# Patient Record
Sex: Female | Born: 1940 | Race: Black or African American | Hispanic: No | Smoking: Never smoker
Health system: Southern US, Community
[De-identification: ages and names within clinical notes are randomized; demographics above are authoritative.]

## PROBLEM LIST (undated history)

## (undated) DIAGNOSIS — K8020 Calculus of gallbladder without cholecystitis without obstruction: Secondary | ICD-10-CM

## (undated) DIAGNOSIS — D219 Benign neoplasm of connective and other soft tissue, unspecified: Secondary | ICD-10-CM

## (undated) DIAGNOSIS — E785 Hyperlipidemia, unspecified: Secondary | ICD-10-CM

## (undated) DIAGNOSIS — R011 Cardiac murmur, unspecified: Secondary | ICD-10-CM

## (undated) DIAGNOSIS — I10 Essential (primary) hypertension: Secondary | ICD-10-CM

## (undated) HISTORY — DX: Essential (primary) hypertension: I10

## (undated) HISTORY — PX: NO PAST SURGERIES: SHX2092

## (undated) HISTORY — DX: Hyperlipidemia, unspecified: E78.5

## (undated) HISTORY — DX: Calculus of gallbladder without cholecystitis without obstruction: K80.20

## (undated) HISTORY — DX: Benign neoplasm of connective and other soft tissue, unspecified: D21.9

## (undated) HISTORY — DX: Cardiac murmur, unspecified: R01.1

---

## 1997-10-24 ENCOUNTER — Other Ambulatory Visit: Admission: RE | Admit: 1997-10-24 | Discharge: 1997-10-24 | Payer: Self-pay | Admitting: Gynecology

## 1997-11-28 ENCOUNTER — Ambulatory Visit (HOSPITAL_COMMUNITY): Admission: RE | Admit: 1997-11-28 | Discharge: 1997-11-28 | Payer: Self-pay | Admitting: Gastroenterology

## 1998-11-12 ENCOUNTER — Other Ambulatory Visit: Admission: RE | Admit: 1998-11-12 | Discharge: 1998-11-12 | Payer: Self-pay | Admitting: Gynecology

## 1998-11-12 ENCOUNTER — Encounter (INDEPENDENT_AMBULATORY_CARE_PROVIDER_SITE_OTHER): Payer: Self-pay | Admitting: Specialist

## 1998-12-04 ENCOUNTER — Encounter: Admission: RE | Admit: 1998-12-04 | Discharge: 1999-03-04 | Payer: Self-pay | Admitting: Gynecology

## 1999-11-24 ENCOUNTER — Other Ambulatory Visit: Admission: RE | Admit: 1999-11-24 | Discharge: 1999-11-24 | Payer: Self-pay | Admitting: Gynecology

## 2000-11-23 ENCOUNTER — Other Ambulatory Visit: Admission: RE | Admit: 2000-11-23 | Discharge: 2000-11-23 | Payer: Self-pay | Admitting: Gynecology

## 2000-12-08 ENCOUNTER — Encounter: Admission: RE | Admit: 2000-12-08 | Discharge: 2001-03-08 | Payer: Self-pay | Admitting: Gynecology

## 2001-10-17 LAB — HM DEXA SCAN: HM Dexa Scan: NORMAL

## 2001-12-29 ENCOUNTER — Other Ambulatory Visit: Admission: RE | Admit: 2001-12-29 | Discharge: 2001-12-29 | Payer: Self-pay | Admitting: Gynecology

## 2003-06-06 ENCOUNTER — Other Ambulatory Visit: Admission: RE | Admit: 2003-06-06 | Discharge: 2003-06-06 | Payer: Self-pay | Admitting: Family Medicine

## 2004-06-04 ENCOUNTER — Encounter (INDEPENDENT_AMBULATORY_CARE_PROVIDER_SITE_OTHER): Payer: Self-pay | Admitting: Specialist

## 2004-06-04 ENCOUNTER — Ambulatory Visit (HOSPITAL_COMMUNITY): Admission: RE | Admit: 2004-06-04 | Discharge: 2004-06-04 | Payer: Self-pay | Admitting: Gastroenterology

## 2004-06-04 LAB — HM COLONOSCOPY

## 2004-07-07 ENCOUNTER — Ambulatory Visit: Payer: Self-pay | Admitting: Family Medicine

## 2004-09-23 ENCOUNTER — Ambulatory Visit: Payer: Self-pay | Admitting: Family Medicine

## 2004-10-01 ENCOUNTER — Ambulatory Visit: Payer: Self-pay | Admitting: Family Medicine

## 2004-12-02 ENCOUNTER — Other Ambulatory Visit: Admission: RE | Admit: 2004-12-02 | Discharge: 2004-12-02 | Payer: Self-pay | Admitting: Family Medicine

## 2004-12-02 ENCOUNTER — Encounter: Payer: Self-pay | Admitting: Family Medicine

## 2004-12-02 ENCOUNTER — Ambulatory Visit: Payer: Self-pay | Admitting: Family Medicine

## 2004-12-02 LAB — CONVERTED CEMR LAB: Pap Smear: NORMAL

## 2005-06-02 ENCOUNTER — Ambulatory Visit: Payer: Self-pay | Admitting: Family Medicine

## 2005-12-29 ENCOUNTER — Ambulatory Visit: Payer: Self-pay | Admitting: Family Medicine

## 2006-03-18 ENCOUNTER — Ambulatory Visit: Payer: Self-pay | Admitting: Family Medicine

## 2006-03-18 LAB — CONVERTED CEMR LAB
ALT: 17 units/L (ref 0–40)
AST: 24 units/L (ref 0–37)
Albumin: 3.9 g/dL (ref 3.5–5.2)
Alkaline Phosphatase: 84 units/L (ref 39–117)
BUN: 10 mg/dL (ref 6–23)
Basophils Absolute: 0 10*3/uL (ref 0.0–0.1)
Basophils Relative: 0.4 % (ref 0.0–1.0)
Bilirubin, Direct: 0.1 mg/dL (ref 0.0–0.3)
CO2: 31 meq/L (ref 19–32)
Calcium: 10.1 mg/dL (ref 8.4–10.5)
Chloride: 102 meq/L (ref 96–112)
Cholesterol: 226 mg/dL (ref 0–200)
Creatinine, Ser: 1 mg/dL (ref 0.4–1.2)
Direct LDL: 129.8 mg/dL
Eosinophils Absolute: 0.2 10*3/uL (ref 0.0–0.6)
Eosinophils Relative: 2.2 % (ref 0.0–5.0)
GFR calc Af Amer: 72 mL/min
GFR calc non Af Amer: 60 mL/min
Glucose, Bld: 106 mg/dL — ABNORMAL HIGH (ref 70–99)
HCT: 39.4 % (ref 36.0–46.0)
HDL: 71 mg/dL (ref >39.0)
Hemoglobin: 13.4 g/dL (ref 12.0–15.0)
Lymphocytes Relative: 28.8 % (ref 12.0–46.0)
MCHC: 33.9 g/dL (ref 30.0–36.0)
MCV: 83 fL (ref 78.0–100.0)
Monocytes Absolute: 0.7 10*3/uL (ref 0.2–0.7)
Monocytes Relative: 6.8 % (ref 3.0–11.0)
Neutro Abs: 6.3 10*3/uL (ref 1.4–7.7)
Neutrophils Relative %: 61.8 % (ref 43.0–77.0)
Platelets: 345 10*3/uL (ref 150–400)
Potassium: 3.6 meq/L (ref 3.5–5.1)
RBC: 4.75 M/uL (ref 3.87–5.11)
RDW: 13.4 % (ref 11.5–14.6)
Sodium: 139 meq/L (ref 135–145)
TSH: 2.1 microintl units/mL (ref 0.35–5.50)
Total Bilirubin: 0.6 mg/dL (ref 0.3–1.2)
Total CHOL/HDL Ratio: 3.2
Total Protein: 7.8 g/dL (ref 6.0–8.3)
Triglycerides: 95 mg/dL (ref 0–149)
VLDL: 19 mg/dL (ref 0–40)
WBC: 10.1 10*3/uL (ref 4.5–10.5)

## 2006-12-06 ENCOUNTER — Encounter: Payer: Self-pay | Admitting: Family Medicine

## 2006-12-06 ENCOUNTER — Ambulatory Visit: Payer: Self-pay | Admitting: Internal Medicine

## 2007-03-23 ENCOUNTER — Encounter: Payer: Self-pay | Admitting: Family Medicine

## 2007-03-23 DIAGNOSIS — D259 Leiomyoma of uterus, unspecified: Secondary | ICD-10-CM | POA: Insufficient documentation

## 2007-03-23 DIAGNOSIS — R011 Cardiac murmur, unspecified: Secondary | ICD-10-CM

## 2007-03-23 DIAGNOSIS — N951 Menopausal and female climacteric states: Secondary | ICD-10-CM | POA: Insufficient documentation

## 2007-03-23 DIAGNOSIS — I10 Essential (primary) hypertension: Secondary | ICD-10-CM

## 2007-03-23 DIAGNOSIS — E1169 Type 2 diabetes mellitus with other specified complication: Secondary | ICD-10-CM | POA: Insufficient documentation

## 2007-03-23 DIAGNOSIS — E7800 Pure hypercholesterolemia, unspecified: Secondary | ICD-10-CM

## 2007-03-24 ENCOUNTER — Ambulatory Visit: Payer: Self-pay | Admitting: Family Medicine

## 2007-03-25 ENCOUNTER — Encounter: Payer: Self-pay | Admitting: Family Medicine

## 2007-03-29 ENCOUNTER — Ambulatory Visit: Payer: Self-pay | Admitting: Family Medicine

## 2007-03-30 LAB — CONVERTED CEMR LAB
Albumin: 4 g/dL (ref 3.5–5.2)
Basophils Absolute: 0.1 10*3/uL (ref 0.0–0.1)
Calcium: 10 mg/dL (ref 8.4–10.5)
Chloride: 104 meq/L (ref 96–112)
Cholesterol: 238 mg/dL (ref 0–200)
Eosinophils Absolute: 0.2 10*3/uL (ref 0.0–0.6)
GFR calc Af Amer: 81 mL/min
GFR calc non Af Amer: 67 mL/min
HDL: 76 mg/dL (ref >39.0)
MCHC: 32.9 g/dL (ref 30.0–36.0)
MCV: 84.5 fL (ref 78.0–100.0)
Monocytes Relative: 5.8 % (ref 3.0–11.0)
Platelets: 267 10*3/uL (ref 150–400)
Potassium: 3.7 meq/L (ref 3.5–5.1)
RBC: 5.04 M/uL (ref 3.87–5.11)
RDW: 14.1 % (ref 11.5–14.6)
TSH: 3.1 microintl units/mL (ref 0.35–5.50)
Total CHOL/HDL Ratio: 3.1
Triglycerides: 132 mg/dL (ref 0–149)

## 2007-04-04 ENCOUNTER — Encounter (INDEPENDENT_AMBULATORY_CARE_PROVIDER_SITE_OTHER): Payer: Self-pay | Admitting: *Deleted

## 2007-04-07 ENCOUNTER — Ambulatory Visit: Payer: Self-pay | Admitting: Family Medicine

## 2007-06-29 ENCOUNTER — Ambulatory Visit: Payer: Self-pay | Admitting: Family Medicine

## 2007-07-01 LAB — CONVERTED CEMR LAB: Hgb A1c MFr Bld: 6.6 % — ABNORMAL HIGH (ref 4.6–6.0)

## 2007-07-06 ENCOUNTER — Ambulatory Visit: Payer: Self-pay | Admitting: Family Medicine

## 2007-07-06 DIAGNOSIS — E119 Type 2 diabetes mellitus without complications: Secondary | ICD-10-CM | POA: Insufficient documentation

## 2007-07-08 ENCOUNTER — Encounter: Payer: Self-pay | Admitting: Family Medicine

## 2007-07-25 ENCOUNTER — Encounter: Payer: Self-pay | Admitting: Family Medicine

## 2007-07-25 ENCOUNTER — Encounter: Admission: RE | Admit: 2007-07-25 | Discharge: 2007-08-09 | Payer: Self-pay | Admitting: Family Medicine

## 2007-08-24 ENCOUNTER — Ambulatory Visit: Payer: Self-pay | Admitting: Family Medicine

## 2007-10-31 ENCOUNTER — Ambulatory Visit: Payer: Self-pay | Admitting: Family Medicine

## 2007-11-14 ENCOUNTER — Ambulatory Visit: Payer: Self-pay | Admitting: Family Medicine

## 2008-01-31 ENCOUNTER — Ambulatory Visit: Payer: Self-pay | Admitting: Family Medicine

## 2008-02-01 LAB — CONVERTED CEMR LAB
ALT: 19 units/L (ref 0–35)
AST: 23 units/L (ref 0–37)
CO2: 30 meq/L (ref 19–32)
Chloride: 107 meq/L (ref 96–112)
Cholesterol: 206 mg/dL (ref 0–200)
GFR calc Af Amer: 72 mL/min
GFR calc non Af Amer: 59 mL/min
Hgb A1c MFr Bld: 6.3 % — ABNORMAL HIGH (ref 4.6–6.0)
Phosphorus: 3.8 mg/dL (ref 2.3–4.6)
Potassium: 3.6 meq/L (ref 3.5–5.1)
Sodium: 142 meq/L (ref 135–145)
Total CHOL/HDL Ratio: 2.3
VLDL: 11 mg/dL (ref 0–40)

## 2008-02-06 ENCOUNTER — Ambulatory Visit: Payer: Self-pay | Admitting: Family Medicine

## 2008-03-12 ENCOUNTER — Encounter: Payer: Self-pay | Admitting: Family Medicine

## 2008-03-26 ENCOUNTER — Encounter: Payer: Self-pay | Admitting: Family Medicine

## 2008-03-28 ENCOUNTER — Encounter (INDEPENDENT_AMBULATORY_CARE_PROVIDER_SITE_OTHER): Payer: Self-pay | Admitting: *Deleted

## 2008-08-07 ENCOUNTER — Ambulatory Visit: Payer: Self-pay | Admitting: Family Medicine

## 2008-08-08 LAB — CONVERTED CEMR LAB
ALT: 16 units/L (ref 0–35)
Alkaline Phosphatase: 59 units/L (ref 39–117)
Bilirubin, Direct: 0 mg/dL (ref 0.0–0.3)
CO2: 30 meq/L (ref 19–32)
Calcium: 9.6 mg/dL (ref 8.4–10.5)
Chloride: 106 meq/L (ref 96–112)
Creatinine, Ser: 0.9 mg/dL (ref 0.4–1.2)
Creatinine,U: 38 mg/dL
Direct LDL: 117.5 mg/dL
Potassium: 3.8 meq/L (ref 3.5–5.1)
Sodium: 144 meq/L (ref 135–145)
Total Bilirubin: 0.9 mg/dL (ref 0.3–1.2)
Total CHOL/HDL Ratio: 3
Total Protein: 7.3 g/dL (ref 6.0–8.3)
Triglycerides: 138 mg/dL (ref 0.0–149.0)

## 2008-08-14 ENCOUNTER — Other Ambulatory Visit: Admission: RE | Admit: 2008-08-14 | Discharge: 2008-08-14 | Payer: Self-pay | Admitting: Family Medicine

## 2008-08-14 ENCOUNTER — Ambulatory Visit: Payer: Self-pay | Admitting: Family Medicine

## 2008-08-14 ENCOUNTER — Encounter: Payer: Self-pay | Admitting: Family Medicine

## 2008-08-14 LAB — HM PAP SMEAR

## 2008-08-17 ENCOUNTER — Encounter (INDEPENDENT_AMBULATORY_CARE_PROVIDER_SITE_OTHER): Payer: Self-pay | Admitting: *Deleted

## 2008-09-06 ENCOUNTER — Emergency Department (HOSPITAL_COMMUNITY): Admission: EM | Admit: 2008-09-06 | Discharge: 2008-09-06 | Payer: Self-pay | Admitting: Emergency Medicine

## 2009-01-21 ENCOUNTER — Telehealth: Payer: Self-pay | Admitting: Family Medicine

## 2009-01-29 ENCOUNTER — Ambulatory Visit: Payer: Self-pay | Admitting: Internal Medicine

## 2009-01-29 ENCOUNTER — Encounter: Payer: Self-pay | Admitting: Family Medicine

## 2009-02-11 ENCOUNTER — Ambulatory Visit: Payer: Self-pay | Admitting: Family Medicine

## 2009-02-12 ENCOUNTER — Telehealth: Payer: Self-pay | Admitting: Family Medicine

## 2009-02-12 LAB — CONVERTED CEMR LAB
Cholesterol: 235 mg/dL — ABNORMAL HIGH (ref 0–200)
Direct LDL: 122.9 mg/dL
Hgb A1c MFr Bld: 6.2 % (ref 4.6–6.5)
Triglycerides: 97 mg/dL (ref 0.0–149.0)
VLDL: 19.4 mg/dL (ref 0.0–40.0)

## 2009-02-20 ENCOUNTER — Encounter (INDEPENDENT_AMBULATORY_CARE_PROVIDER_SITE_OTHER): Payer: Self-pay | Admitting: *Deleted

## 2009-03-21 ENCOUNTER — Encounter: Payer: Self-pay | Admitting: Family Medicine

## 2009-03-27 ENCOUNTER — Encounter: Payer: Self-pay | Admitting: Family Medicine

## 2009-04-08 ENCOUNTER — Encounter: Payer: Self-pay | Admitting: Family Medicine

## 2009-04-08 ENCOUNTER — Encounter (INDEPENDENT_AMBULATORY_CARE_PROVIDER_SITE_OTHER): Payer: Self-pay | Admitting: *Deleted

## 2009-05-07 ENCOUNTER — Encounter: Payer: Self-pay | Admitting: Family Medicine

## 2009-05-14 ENCOUNTER — Ambulatory Visit: Payer: Self-pay | Admitting: Family Medicine

## 2009-05-14 LAB — CONVERTED CEMR LAB
ALT: 17 units/L (ref 0–35)
HDL: 89.3 mg/dL (ref >39.00)
Triglycerides: 96 mg/dL (ref 0.0–149.0)
VLDL: 19.2 mg/dL (ref 0.0–40.0)

## 2009-05-21 ENCOUNTER — Ambulatory Visit: Payer: Self-pay | Admitting: Family Medicine

## 2009-06-26 ENCOUNTER — Encounter: Payer: Self-pay | Admitting: Family Medicine

## 2009-07-02 ENCOUNTER — Ambulatory Visit: Payer: Self-pay | Admitting: Family Medicine

## 2009-07-30 ENCOUNTER — Encounter: Admission: RE | Admit: 2009-07-30 | Discharge: 2009-07-30 | Payer: Self-pay | Admitting: Gastroenterology

## 2009-07-30 ENCOUNTER — Encounter: Payer: Self-pay | Admitting: Family Medicine

## 2009-08-20 ENCOUNTER — Ambulatory Visit: Payer: Self-pay | Admitting: Family Medicine

## 2009-08-20 LAB — CONVERTED CEMR LAB
Albumin: 4 g/dL (ref 3.5–5.2)
BUN: 14 mg/dL (ref 6–23)
CO2: 26 meq/L (ref 19–32)
Calcium: 9.6 mg/dL (ref 8.4–10.5)
Cholesterol: 186 mg/dL (ref 0–200)
Creatinine, Ser: 0.9 mg/dL (ref 0.4–1.2)
LDL Cholesterol: 98 mg/dL (ref 0–99)
Total CHOL/HDL Ratio: 3
Triglycerides: 82 mg/dL (ref 0.0–149.0)
VLDL: 16.4 mg/dL (ref 0.0–40.0)

## 2009-08-28 ENCOUNTER — Ambulatory Visit: Payer: Self-pay | Admitting: Family Medicine

## 2009-09-03 ENCOUNTER — Telehealth: Payer: Self-pay | Admitting: Family Medicine

## 2010-02-19 ENCOUNTER — Other Ambulatory Visit: Payer: Self-pay | Admitting: Family Medicine

## 2010-02-19 ENCOUNTER — Ambulatory Visit
Admission: RE | Admit: 2010-02-19 | Discharge: 2010-02-19 | Payer: Self-pay | Source: Home / Self Care | Attending: Family Medicine | Admitting: Family Medicine

## 2010-02-19 LAB — LIPID PANEL
Cholesterol: 245 mg/dL — ABNORMAL HIGH (ref 0–200)
HDL: 93.4 mg/dL (ref >39.00)
Total CHOL/HDL Ratio: 3
Triglycerides: 57 mg/dL (ref 0.0–149.0)
VLDL: 11.4 mg/dL (ref 0.0–40.0)

## 2010-02-19 LAB — RENAL FUNCTION PANEL
Albumin: 3.8 g/dL (ref 3.5–5.2)
BUN: 17 mg/dL (ref 6–23)
CO2: 31 mEq/L (ref 19–32)
Calcium: 9.8 mg/dL (ref 8.4–10.5)
Chloride: 105 mEq/L (ref 96–112)
Creatinine, Ser: 0.9 mg/dL (ref 0.4–1.2)
GFR: 79.1 mL/min (ref >60.00)
Glucose, Bld: 98 mg/dL (ref 70–99)
Phosphorus: 3.9 mg/dL (ref 2.3–4.6)
Potassium: 3.9 mEq/L (ref 3.5–5.1)
Sodium: 142 mEq/L (ref 135–145)

## 2010-02-19 LAB — LDL CHOLESTEROL, DIRECT: Direct LDL: 131 mg/dL

## 2010-02-19 LAB — ALT: ALT: 38 U/L — ABNORMAL HIGH (ref 0–35)

## 2010-02-19 LAB — AST: AST: 36 U/L (ref 0–37)

## 2010-02-19 LAB — HEMOGLOBIN A1C: Hgb A1c MFr Bld: 6.4 % (ref 4.6–6.5)

## 2010-02-24 ENCOUNTER — Ambulatory Visit
Admission: RE | Admit: 2010-02-24 | Discharge: 2010-02-24 | Payer: Self-pay | Source: Home / Self Care | Attending: Family Medicine | Admitting: Family Medicine

## 2010-02-24 LAB — HM DIABETES FOOT EXAM

## 2010-03-16 LAB — CONVERTED CEMR LAB
AST: 22 units/L (ref 0–37)
BUN: 12 mg/dL (ref 6–23)
Calcium: 9.8 mg/dL (ref 8.4–10.5)
Chloride: 111 meq/L (ref 96–112)
GFR calc non Af Amer: 59 mL/min
Glucose, Bld: 93 mg/dL (ref 70–99)
HDL: 62.8 mg/dL (ref >39.0)
Hgb A1c MFr Bld: 6.2 % — ABNORMAL HIGH (ref 4.6–6.0)
Phosphorus: 3.8 mg/dL (ref 2.3–4.6)
Potassium: 4 meq/L (ref 3.5–5.1)
Sodium: 145 meq/L (ref 135–145)
VLDL: 10 mg/dL (ref 0–40)

## 2010-03-18 ENCOUNTER — Ambulatory Visit
Admission: RE | Admit: 2010-03-18 | Discharge: 2010-03-18 | Payer: Self-pay | Source: Home / Self Care | Attending: Family Medicine | Admitting: Family Medicine

## 2010-03-20 NOTE — Consult Note (Signed)
Summary: [facility]  [facility]   Imported By: Lanelle Bal 05/16/2009 12:00:45  _____________________________________________________________________  External Attachment:    Type:   Image     Comment:   External Document

## 2010-03-20 NOTE — Letter (Signed)
Summary: Results Follow up Letter  [place] at [facility]  [address] [place], [place] [address]   Phone: [phone]  Fax: [phone]    04/08/2009 MRN: [id number]    [facility] [address] [place], [place]  [address]    Dear Ms. Mcclain,  The following are the results of your recent test(s):  Test         Result    Pap Smear:        Normal _____  Not Normal _____ Comments: ______________________________________________________ Cholesterol: LDL(Bad cholesterol):         Your goal is less than:         HDL (Good cholesterol):       Your goal is more than: Comments:  ______________________________________________________ Mammogram:        Normal __X___  Not Normal _____ Comments:  Yearly follow up is recommended.   ___________________________________________________________________ Hemoccult:        Normal _____  Not normal _______ Comments:    _____________________________________________________________________ Other Tests:    We routinely do not discuss normal results over the telephone.  If you desire a copy of the results, or you have any questions about this information we can discuss them at your next office visit.   Sincerely,

## 2010-03-20 NOTE — Assessment & Plan Note (Signed)
Summary: 6WK FOLLOW UP / LFW   Vital Signs:  Patient profile:   70 year old female Height:      60.75 inches Weight:      136.25 pounds BMI:     26.05 Temp:     97.9 degrees F oral Pulse rate:   72 / minute Pulse rhythm:   regular BP sitting:   148 / 82  (left arm) Cuff size:   regular  Vitals Entered By: Lewanda Rife LPN (Jul 02, 2009 9:01 AM)  Serial Vital Signs/Assessments:  Time      Position  BP       Pulse  Resp  Temp     By                     140/80                         Colon Flattery Caldonia Leap MD                     140/80                         Judith Part MD  CC: six week f/u   History of Present Illness: here for f/u of HTN   is feeling ok   bp first check today 148/82-- this is slt imp from last time last visit did inc lisinopril to 40  has checked bp at work -- is still high --- 160s - may not be reliable   sugar control good and stable  exercise -- is really trying  is working out in yard every day  not much on rainy days   Allergies: 1)  ! Septra 2)  ! Advil 3)  ! 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 Eyes:  Denies blurring and eye irritation. CV:  Denies chest pain or discomfort, lightheadness, palpitations, shortness of breath with exertion, and swelling of feet. Resp:  Denies cough, shortness of breath, and wheezing. GI:   Denies abdominal pain and change in bowel habits. GU:  Denies urinary frequency. MS:  Denies joint pain. Derm:  Denies lesion(s), poor wound healing, and rash. Neuro:  Denies headaches, numbness, and tingling. Endo:  Denies cold intolerance, excessive thirst, excessive urination, and heat intolerance. Heme:  Denies abnormal bruising and bleeding.  Physical Exam  General:  Well-developed,well-nourished,in no acute distress; alert,appropriate and cooperative throughout examination Head:  normocephalic, atraumatic, and no abnormalities observed.   Eyes:  vision grossly intact, pupils equal, pupils round, and pupils reactive to light.   Mouth:  pharynx pink and moist.   Neck:  supple with full rom and no masses or thyromegally, no JVD or carotid bruit  Lungs:  Normal respiratory effort, chest expands symmetrically. Lungs are clear to auscultation, no crackles or wheezes. Heart:  systolic M- quiet  RRR Msk:  No deformity or scoliosis noted  of thoracic or lumbar spine.  no acute joint changes  Pulses:  R and L carotid,radial,femoral,dorsalis pedis and posterior tibial pulses are full and equal bilaterally Extremities:  No clubbing, cyanosis, edema, or deformity noted with normal full range of motion of all joints.   Neurologic:  sensation intact to light touch, gait normal, and DTRs symmetrical and normal.   Skin:  Intact without suspicious lesions or rashes Cervical Nodes:  No lymphadenopathy noted Psych:  normal affect, talkative and pleasant   Diabetes Management Exam:    Foot Exam (with socks and/or shoes not present):       Sensory-Pinprick/Light touch:          Left medial foot (L-4): normal          Left dorsal foot (L-5): normal          Left lateral foot (S-1): normal          Right medial foot (L-4): normal          Right dorsal foot (L-5): normal          Right lateral foot (S-1): normal       Sensory-Monofilament:          Left foot: normal          Right foot: normal        Inspection:          Left foot: normal          Right foot: normal       Nails:          Left foot: normal          Right foot: normal   Impression & Recommendations:  Problem # 1:  HYPERTENSION (ICD-401.9) Assessment Improved  bp isimproved on second check today will continue exercise and better diet  will continue 40 of lisinopril  if it goes up - will plan to add diuretic at f/u  lab and f/u planned for july Her updated medication list for this problem includes:    Lisinopril 40 Mg Tabs (Lisinopril) .Marland Kitchen... 1 by mouth once daily  BP today: 148/82-- re check 140/80 Prior BP: 150/84 (05/21/2009)  Labs Reviewed: K+: 3.8 (08/07/2008) Creat: : 0.9 (08/07/2008)   Chol: 220 (05/14/2009)   HDL: 89.30 (05/14/2009)   LDL: DEL (01/31/2008)   TG: 96.0 (05/14/2009)  Problem # 2:  HYPERCHOLESTEROLEMIA (ICD-272.0) Assessment: Unchanged  fairly controlled - rev diet lab and f/u july   Labs Reviewed: SGOT: 22 (05/14/2009)   SGPT: 17 (05/14/2009)   HDL:89.30 (05/14/2009), 88.60 (02/11/2009)  LDL:DEL (01/31/2008), DEL (10/31/2007)  Chol:220 (05/14/2009), 235 (02/11/2009)  Trig:96.0 (05/14/2009), 97.0 (02/11/2009)  Problem # 3:  HYPERGLYCEMIA (ICD-790.29) Assessment: Comment Only hyperglycemia - controlled by diet  may be able to get [facility] under 6 this next check lab and f/u in july   Complete Medication List: 1)  Lisinopril 40 Mg Tabs (Lisinopril) .Marland Kitchen.. 1 by mouth once daily 2)  Centrum Tabs (Multiple vitamins-minerals) .... Take by mouth as directed 3)  Adult Aspirin Low Strength 81 Mg Tbdp (Aspirin) .... Take one by mouth as directed 4)  Fish Oil Concentrate 1000 Mg Caps (Omega-3 fatty acids) .Marland Kitchen.. 1 daily by mouth 5)  Accu-chek Aviva Strp (Glucose blood) .... Use as directed to check glucose once daily and as needed dx 250.0 6)  Accu-chek Multiclix Lancets Misc (Lancets) .... Use as directed 7)  Accu-chek Aviva Kit (glucometer)  .... To check glucose once daily and as needed for dm  250.0 8)  Caltrate 600 1500 Mg Tabs (Calcium carbonate) .... Take 1 by mouth once daily  Patient Instructions: 1)  try to keep up good diet and exercise 2)  blood pressure is better today  3)  no change in medicine  4)  schedule fasting labs early july lipid/ast/alt/renal/ AIC 250.0, 272  5)  then follow up   Current Allergies (reviewed today): ! SEPTRA ! ADVIL ! * TOMATO

## 2010-03-20 NOTE — Miscellaneous (Signed)
  Clinical Lists Changes  Observations: Added new observation of MAMMO DUE: 03/27/2009 (03/27/2009 10:36) Added new observation of MAMMOGRAM: Normal (03/27/2009 10:36)

## 2010-03-20 NOTE — Progress Notes (Signed)
Summary: pt needs new glucometer  Phone Note Call from Patient Call back at [phone]   Caller: Patient Call For: Judith Part MD Summary of Call: Pt is asking for a script for a new glucometer, needs to include dx and number of times to test daily.  She wil need a written script because she has medicare, this can be faxed to [facility].  She wants accu chek aviva meter.  She has test strips.  Please call pt when done. Initial call taken by: Lowella Petties CMA,  September 03, 2009 9:53 AM  Follow-up for Phone Call        px written on EMR for call in  Follow-up by: Judith Part MD,  September 03, 2009 1:25 PM  Additional Follow-up for Phone Call Additional follow up Details #1::        Accu chek aviva kit  phoned to [facility]  pharmacy as instructed. Patient notified as instructed by telephone. Lewanda Rife LPN  September 03, 2009 3:12 PM     Prescriptions: ACCU-CHEK AVIVA  KIT (GLUCOMETER) to check glucose once daily and as needed for DM 250.0  #1 x 0   Entered and Authorized by:   Judith Part MD   Signed by:   Lewanda Rife LPN on 11/91/4782   Method used:   Telephoned to ...         RxID:   [id number]
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
## 2010-03-20 NOTE — Letter (Signed)
Summary: Results Follow up Letter  [place] at [facility]  [address] [place], [place] [address]   Phone: [phone]  Fax: [phone]    02/20/2009 MRN: [id number]    [facility] [address] [place], [place]  [address]    Dear [name],  The following are the results of your recent test(s):  Test         Result    Pap Smear:        Normal _____  Not Normal _____ Comments: ______________________________________________________ Cholesterol: LDL(Bad cholesterol):         Your goal is less than:         HDL (Good cholesterol):       Your goal is more than: Comments:  ______________________________________________________ Mammogram:        Normal _____  Not Normal _____ Comments:  ___________________________________________________________________ Hemoccult:        Normal _____  Not normal _______ Comments:    _____________________________________________________________________ Other Tests:   Dexa shows normal bone density-- overall very slight decrease since last check.  Continue calcium and vitamin D.  We routinely do not discuss normal results over the telephone.  If you desire a copy of the results, or you have any questions about this information we can discuss them at your next office visit.   Sincerely,    Marne A. Milinda Antis, M.D.  MAT:lsf

## 2010-03-20 NOTE — Letter (Signed)
Summary: [facility]   Imported By: Lanelle Bal 04/01/2009 08:53:51  _____________________________________________________________________  External Attachment:    Type:   Image     Comment:   External Document  Appended Document: [facility]    Clinical Lists Changes  Observations: Added new observation of DMEYEEXAMNXT: 03/2010 (04/01/2009 21:17) Added new observation of DMEYEEXMRES: diabetic retinopathy (03/25/2009 21:17) Added new observation of EYE EXAM BY: Dr Lelon Frohlich (03/25/2009 21:17) Added new observation of DIAB EYE EX: diabetic retinopathy (03/25/2009 21:17)        Diabetes Management Exam:    Eye Exam:       Eye Exam done elsewhere          Date: 03/25/2009          Results: diabetic retinopathy          Done by: Dr Lelon Frohlich

## 2010-03-20 NOTE — Procedures (Signed)
Summary: Colonoscopy by Dr.Jyothi Mann,[facility]  Colonoscopy by Dr.Jyothi Mann,[facility]   Imported By: Beau Fanny 06/26/2009 14:36:08  _____________________________________________________________________  External Attachment:    Type:   Image     Comment:   External Document

## 2010-03-20 NOTE — Assessment & Plan Note (Signed)
Summary: F/U AFTER LABS / LFW   Vital Signs:  Patient profile:   69 year old female Height:      60.75 inches Weight:      138.50 pounds BMI:     26.48 Temp:     98.1 degrees F oral Pulse rate:   68 / minute Pulse rhythm:   regular BP sitting:   136 / 72  (left arm) Cuff size:   regular  Vitals Entered By: Lewanda Rife LPN (August 28, 2009 9:08 AM)  Serial Vital Signs/Assessments:  Time      Position  BP       Pulse  Resp  Temp     By                     130/80                         Judith Part MD  CC: follow-up visit   History of Present Illness: here for f/u of hyperglycemia and HTN and lipids   bp is stalble today at 136/72 first check  lipids are imp with LDL of 98 and HDL great at 82  AIC is 6.2 up from 6.1- overall stable  diet is fair -- is good most of the time  trying to avoid sweets and also starches   is gettting some exercise - is walking and some gardening  does not have indoor option  she can go to the gym with her insurance  feeling good overall    no change in meds  stopped her asa -- ? know why      Allergies: 1)  ! Septra 2)  ! Advil 3)  ! * Tomato  Past History:  Family History: Last updated: 03/24/2007 Father:  Mother: HTN, DM Siblings:  aunt colon ca aunt DM brother DM sister OP  Social History: Last updated: 02/06/2008 Marital Status: widowed Children: 4 Occupation: domestic work Never Smoked no alcohol  Risk Factors: Smoking Status: never (03/24/2007)  Past Medical History: Hypertension DM2 5/09 hyperlipidemia  fibroids Murmur incidental asympt gallstones  GI- Dr Loreta Ave  Past Surgical History: colonoscopy- neg (1995) Dexa- normal (10/2001) Uterine fibroids (2003) Colonoscopy- polyps, hems (05/2004) colonosc incomplete 2011 BE- with 1 tic -- 2011 (f/u from colonosc) -uterine fibroid and several gallstones seen- asymptomatic  Review of Systems General:  Denies fatigue, fever, and malaise. Eyes:   Denies blurring and eye irritation. CV:  Denies chest pain or discomfort, palpitations, and swelling of feet. Resp:  Denies cough and wheezing. GI:  Denies abdominal pain, change in bowel habits, and indigestion. MS:  Denies muscle aches and cramps. Derm:  Denies itching, lesion(s), poor wound healing, and rash. Neuro:  Denies headaches, numbness, and tingling. Endo:  Denies cold intolerance, excessive thirst, excessive urination, and heat intolerance. Heme:  Denies abnormal bruising and bleeding.  Physical Exam  General:  Well-developed,well-nourished,in no acute distress; alert,appropriate and cooperative throughout examination Head:  normocephalic, atraumatic, and no abnormalities observed.   Eyes:  vision grossly intact, pupils equal, pupils round, and pupils reactive to light.   Mouth:  pharynx pink and moist.   Neck:  supple with full rom and no masses or thyromegally, no JVD or carotid bruit  Chest Wall:  No deformities, masses, or tenderness noted. Lungs:  Normal respiratory effort, chest expands symmetrically. Lungs are clear to auscultation, no crackles or wheezes. Heart:  systolic M- quiet  RRR Abdomen:  Bowel sounds positive,abdomen soft and non-tender without masses, organomegaly or hernias noted. no renal bruits  Msk:  No deformity or scoliosis noted of thoracic or lumbar spine.   Pulses:  R and L carotid,radial,femoral,dorsalis pedis and posterior tibial pulses are full and equal bilaterally Extremities:  No clubbing, cyanosis, edema, or deformity noted with normal full range of motion of all joints.   Neurologic:  sensation intact to light touch, gait normal, and DTRs symmetrical and normal.   Skin:  Intact without suspicious lesions or rashes Cervical Nodes:  No lymphadenopathy noted Inguinal Nodes:  No significant adenopathy Psych:  normal affect, talkative and pleasant    Impression & Recommendations:  Problem # 1:  HYPERGLYCEMIA (ICD-790.29) Assessment  Unchanged  this is stable with diet disc healthy diet (low simple sugar/ choose complex carbs/ low sat fat) diet and exercise in detail  re check AIC 6 mo and f/u  urged daily exercise indoors or out   Labs Reviewed: Creat: 0.9 (08/20/2009)     Last Eye Exam: diabetic retinopathy (03/25/2009)  Problem # 2:  HYPERCHOLESTEROLEMIA (ICD-272.0) Assessment: Improved  this continues to imp with better diet very good HDL  rev low sat fat diet  re check lab and f/u in 6 mo   Labs Reviewed: SGOT: 26 (08/20/2009)   SGPT: 23 (08/20/2009)   HDL:71.60 (08/20/2009), 89.30 (05/14/2009)  LDL:98 (08/20/2009), DEL (65/78/4696)  Chol:186 (08/20/2009), 220 (05/14/2009)  Trig:82.0 (08/20/2009), 96.0 (05/14/2009)  Problem # 3:  HYPERTENSION (ICD-401.9) Assessment: Unchanged  good control with lisinopril rev low sodium diet and exercise lab and f/u 6 mo  rev labs today Her updated medication list for this problem includes:    Lisinopril 40 Mg Tabs (Lisinopril) .Marland Kitchen... 1 by mouth once daily  BP today: 136/72 Prior BP: 148/82 (07/02/2009)  Labs Reviewed: K+: 4.1 (08/20/2009) Creat: : 0.9 (08/20/2009)   Chol: 186 (08/20/2009)   HDL: 71.60 (08/20/2009)   LDL: 98 (08/20/2009)   TG: 82.0 (08/20/2009)  Complete Medication List: 1)  Lisinopril 40 Mg Tabs (Lisinopril) .Marland Kitchen.. 1 by mouth once daily 2)  Centrum Tabs (Multiple vitamins-minerals) .... Take by mouth as directed 3)  Adult Aspirin Low Strength 81 Mg Tbdp (Aspirin) .... Take one by mouth as directed 4)  Fish Oil Concentrate 1000 Mg Caps (Omega-3 fatty acids) .Marland Kitchen.. 1 daily by mouth 5)  Accu-chek Aviva Strp (Glucose blood) .... Use as directed to check glucose once daily and as needed dx 250.0 6)  Accu-chek Multiclix Lancets Misc (Lancets) .... Use as directed 7)  Accu-chek Aviva Kit (glucometer)  .... To check glucose once daily and as needed for dm 250.0 8)  Caltrate 600 1500 Mg Tabs (Calcium carbonate) .... Take 1 by mouth once daily  Patient  Instructions: 1)  keep working on healthy diet and exercise  2)  try to get extra fiber  3)  drink lots of fluids 4)  no change in medicines  5)  schedule fasting labs in 6 months and then follow up lipid/ast/alt/renal / AIC 272, hyperglycemia   Current Allergies (reviewed today): ! SEPTRA ! ADVIL ! * TOMATO

## 2010-03-20 NOTE — Assessment & Plan Note (Signed)
Summary: 3 month follow up   Vital Signs:  Patient profile:   70 year old [name] Height:      60.Sara inches Weight:      136.50 pounds BMI:     26.10 Temp:     97.9 degrees F oral Pulse rate:   72 / minute Pulse rhythm:   regular BP sitting:   150 / 84  (left arm) Cuff size:   regular  Vitals Entered By: Lewanda Rife LPN (May 22, 2839 8:35 AM) CC: 3 month f/u after labs   CC:  3 month f/u after labs.  History of Present Illness: here for f/u of lipids/ DM/ HTN   feels ok overall - nothing new   wt is up 3 lb  bp is up at first check 150/74- on ace-- did take her med - had a stressful drive in   AIC down to 6.1 - good control  checking sugars at homw - usually in low 100s - great  opthy is up to date   lipids : 117 down from 122 --   bp was good at her colonosc screen -- will be may 11th    with diet - is staying away from fats and sugars both  is really trying  is exercising- working in the good weather  2 days per week      Allergies: 1)  ! Septra 2)  ! Advil 3)  ! 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 Eyes:  Denies blurring and eye irritation. CV:  Denies chest pain or discomfort, palpitations, and shortness of breath with exertion. Resp:  Denies cough and wheezing. GI:  Denies abdominal pain, bloody stools,  change in bowel habits, indigestion, and nausea. GU:  Denies urinary frequency. MS:  Denies muscle aches. Derm:  Denies itching, lesion(s), poor wound healing, and rash. Neuro:  Denies numbness and tingling. Endo:  Denies cold intolerance, excessive thirst, excessive urination, and heat intolerance. Heme:  Denies abnormal bruising and bleeding.  Physical Exam  General:  Well-developed,well-nourished,in no acute distress; alert,appropriate and cooperative throughout examination Head:  normocephalic, atraumatic, and no abnormalities observed.   Eyes:  vision grossly intact, pupils equal, pupils round, and pupils reactive to light.   Mouth:  pharynx pink and moist.   Neck:  supple with full rom and no masses or thyromegally, no JVD or carotid bruit  Chest Wall:  No deformities, masses, or tenderness noted. Lungs:  Normal respiratory effort, chest expands symmetrically. Lungs are clear to auscultation, no crackles or wheezes. Heart:  systolic M- quiet  RRR Abdomen:  Bowel sounds positive,abdomen soft and non-tender without masses, organomegaly or hernias noted. no renal bruits  Msk:  No deformity or scoliosis noted of thoracic or lumbar spine.  no acute joint changes  Pulses:  R and L carotid,radial,femoral,dorsalis pedis and posterior tibial pulses are full and equal bilaterally Extremities:  No clubbing, cyanosis, edema, or deformity noted with normal full range  of motion of all joints.   Neurologic:  sensation intact to light touch, gait normal, and DTRs symmetrical and normal.   Skin:  Intact without suspicious lesions or rashes Cervical Nodes:  No lymphadenopathy noted Psych:  normal affect, talkative and pleasant   Diabetes Management Exam:    Foot Exam (with socks and/or shoes not present):       Sensory-Pinprick/Light touch:          Left medial foot (L-4): normal          Left dorsal foot (L-5): normal          Left lateral foot (S-1): normal          Right medial foot (L-4):  normal          Right dorsal foot (L-5): normal          Right lateral foot (S-1): normal       Sensory-Monofilament:          Left foot: normal          Right foot: normal       Inspection:          Left foot: normal          Right foot: normal       Nails:          Left foot: normal          Right foot: normal   Impression & Recommendations:  Problem # 1:  HYPERTENSION (ICD-401.9) Assessment Deteriorated  bp is climbing  inc lisinopril to 40 and update / disc poss side eff f/u 6-8 weeks for re check in meantime plan 5 d of exercise weekly - aerobic  Her updated medication list for this problem includes:    Lisinopril 40 Mg Tabs (Lisinopril) .Marland Kitchen... 1 by mouth once daily  BP today: 150/84 Prior BP: 132/70 (08/14/2008)  Labs Reviewed: K+: 3.8 (08/07/2008) Creat: : 0.9 (08/07/2008)   Chol: 220 (05/14/2009)   HDL: 89.30 (05/14/2009)   LDL: DEL (01/31/2008)   TG: 96.0 (05/14/2009)  Problem # 2:  HYPERCHOLESTEROLEMIA (ICD-272.0) Assessment: Improved  continues to imp with good diet not quite at goal for DM  if we can get AIC below 6 with exercise - may have more wiggle room disc healthy diet (low simple sugar/ choose complex carbs/ low sat fat) diet and exercise in detail  will make plan at HTN f/u rev sat fats to avoid  Labs Reviewed: SGOT: 22 (05/14/2009)   SGPT: 17 (05/14/2009)   HDL:89.30 (05/14/2009), 88.60 (02/11/2009)  LDL:DEL (01/31/2008), DEL (10/31/2007)  Chol:220 (05/14/2009), 235 (02/11/2009)  Trig:96.0 (05/14/2009), 97.0 (02/11/2009)  Problem # 3:  DIABETES MELLITUS, TYPE II, WITHOUT COMPLICATIONS (ICD-250.00) Assessment: Unchanged very good control- pt may be able to get AIC below 6 with more exercise disc healthy diet (low simple sugar/ choose complex carbs/ low sat fat) diet and exercise in detail  make further plan at f/u  commended on good job utd opthy  Her updated medication list for this problem includes:    Lisinopril 40 Mg Tabs (Lisinopril)  .Marland Kitchen... 1 by mouth once daily    Adult Aspirin Low Strength 81 Mg Tbdp (Aspirin) .Marland Kitchen... Take one by mouth as directed  Complete Medication List: 1)  Lisinopril 40 Mg Tabs (Lisinopril) .Marland Kitchen.. 1 by mouth once daily 2)  Centrum Tabs (Multiple vitamins-minerals) .... Take by mouth as directed 3)  Adult Aspirin Low Strength 81 Mg Tbdp (Aspirin) .... Take one by mouth as directed 4)  Fish Oil Concentrate 1000 Mg Caps (Omega-3 fatty acids) .Marland Kitchen.. 1 daily by mouth 5)  Accu-chek Aviva Strp (Glucose blood) .... Use as directed to check glucose once daily and as needed dx 250.0 6)  Accu-chek Multiclix Lancets Misc (Lancets) .... Use as directed 7)  Accu-chek Aviva Kit (glucometer)  .... To check glucose once daily and as needed for dm 250.0 8)  Caltrate 600 1500 Mg Tabs (Calcium carbonate) .... Take 1 by mouth once daily  Patient Instructions: 1)  increase the lisinopril you have to 2 pills once daily - for total of 40 mg  2)  then new px will = 40 mg so just one per day 3)  work on exercise 5 days per week 4)  follow up with me in 6-8 weeks to re check blood pressure  Prescriptions: ACCU-CHEK AVIVA  STRP (GLUCOSE BLOOD) use as directed to check glucose once daily and as needed dx 250.0  #100 x 3   Entered and Authorized by:   Judith Part MD   Signed by:   Judith Part MD on 05/21/2009   Method used:   Print then Give to Patient   RxID:   [id number] LISINOPRIL 40 MG TABS (LISINOPRIL) 1 by mouth once daily  #30 x 11   Entered and Authorized by:   Judith Part MD   Signed by:   Judith Part MD on 05/21/2009   Method used:   Print then Give to Patient   RxID:   [id number]   Current Allergies (reviewed today): ! SEPTRA ! ADVIL ! * TOMATO

## 2010-03-24 ENCOUNTER — Encounter: Payer: Self-pay | Admitting: Family Medicine

## 2010-03-26 NOTE — Assessment & Plan Note (Signed)
Summary: blood pressure check/alc  Nurse Visit   Vital Signs:  Patient profile:   70 year old female Temp:     98.2 degrees F oral Pulse rate:   80 / minute Pulse rhythm:   regular BP sitting:   130 / 80  (left arm) Cuff size:   regular  Vitals Entered By: Sydell Axon LPN (March 18, 2010 8:37 AM) CC: Patient came to office for BP check. Patient states that she has been taking her BP medication without any problems. Patient states that she took her BP medicaiton this morning prior to this visit around 7:30AM. Patient states that she has had some left arm pain for about a week and thinks that it may be arthritis. Patient will call back and schedule an appointment to be seen for this if the pain does not continue to improve.   Allergies: 1)  ! Septra 2)  ! Advil 3)  ! * Tomato  Orders Added: 1)  Est. Patient Level I [[address]]  Appended Document: blood pressure check/alc that blood pressure is better- tell pt I am re- assured  f/u if arm problem does not improve  Appended Document: blood pressure check/alc Left detailed message on home machine notifying her. Instructed her to call with any questions or concerns.

## 2010-03-31 ENCOUNTER — Encounter: Payer: Self-pay | Admitting: Family Medicine

## 2010-03-31 ENCOUNTER — Ambulatory Visit (INDEPENDENT_AMBULATORY_CARE_PROVIDER_SITE_OTHER): Payer: MEDICARE | Admitting: Family Medicine

## 2010-03-31 DIAGNOSIS — M5412 Radiculopathy, cervical region: Secondary | ICD-10-CM

## 2010-04-02 ENCOUNTER — Encounter: Payer: Self-pay | Admitting: Family Medicine

## 2010-04-07 ENCOUNTER — Encounter: Payer: Self-pay | Admitting: Family Medicine

## 2010-04-09 ENCOUNTER — Encounter: Payer: Self-pay | Admitting: Family Medicine

## 2010-04-09 LAB — HM MAMMOGRAPHY: HM Mammogram: NORMAL
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
## 2010-04-10 ENCOUNTER — Encounter (INDEPENDENT_AMBULATORY_CARE_PROVIDER_SITE_OTHER): Payer: Self-pay | Admitting: *Deleted

## 2010-04-15 NOTE — Letter (Signed)
Summary: Results Follow up Letter  [facility]  [address] [place], [place] [address]   Phone: [phone]  Fax: [phone]    04/10/2010 MRN: [id number]  [facility] [address] [place], [place]  [address]  [place]  Dear Ms. Jarchow,  The following are the results of your recent test(s):  Test         Result    Pap Smear:        Normal _____  Not Normal _____ Comments: ______________________________________________________ Cholesterol: LDL(Bad cholesterol):         Your goal is less than:         HDL (Good cholesterol):       Your goal is more than: Comments:  ______________________________________________________ Mammogram:        Normal __X___  Not Normal _____ Comments: Repeat in 1 year  ___________________________________________________________________ Hemoccult:        Normal _____  Not normal _______ Comments:    _____________________________________________________________________ Other Tests:    We routinely do not discuss normal results over the telephone.  If you desire a copy of the results, or you have any questions about this information we can discuss them at your next office visit.   Sincerely,       Sharilyn Sites for Dr. Roxy Manns

## 2010-04-15 NOTE — Letter (Signed)
Summary: [facility]   Imported By: Lanelle Bal 04/08/2010 12:55:15  _____________________________________________________________________  External Attachment:    Type:   Image     Comment:   External Document  Appended Document: [facility]    Clinical Lists Changes  Observations: Added new observation of PAST MED HX: Hypertension DM2 5/09 hyperlipidemia  fibroids Murmur incidental asympt gallstones  GI- Dr Loreta Ave opthy- Porfilio (04/08/2010 22:35) Added new observation of DMEYEEXAMNXT: 03/2011 (04/08/2010 22:35) Added new observation of DMEYEEXMRES: normal (03/24/2010 22:35) Added new observation of EYE EXAM BY: Dr Druscilla Brownie (03/24/2010 22:35) Added new observation of DIAB EYE EX: normal (03/24/2010 22:35)       Past Medical History:    Hypertension    DM2 5/09    hyperlipidemia     fibroids    Murmur    incidental asympt gallstones        GI- Dr Loreta Ave    opthy- Porfilio   Diabetes Management Exam:    Eye Exam:       Eye Exam done elsewhere          Date: 03/24/2010          Results: normal          Done by: Dr Druscilla Brownie

## 2010-04-15 NOTE — Miscellaneous (Signed)
Summary: Mammogram to flowsheet  Clinical Lists Changes  Observations: Added new observation of MAMMO DUE: 04/2011 (04/10/2010 10:14) Added new observation of MAMMOGRAM: normal (04/09/2010 10:14)      Preventive Care Screening  Mammogram:    Date:  04/09/2010    Next Due:  04/2011    Results:  normal

## 2010-04-24 NOTE — Medication Information (Signed)
Summary: Order for Diabetic Testing Supplies  Order for Diabetic Testing Supplies   Imported By: Maryln Gottron 04/14/2010 14:09:41  _____________________________________________________________________  External Attachment:    Type:   Image     Comment:   External Document

## 2010-05-25 LAB — URINALYSIS, ROUTINE W REFLEX MICROSCOPIC
Hgb urine dipstick: NEGATIVE
Nitrite: NEGATIVE
Protein, ur: NEGATIVE mg/dL
Specific Gravity, Urine: 1.001 — ABNORMAL LOW (ref 1.005–1.030)
Urobilinogen, UA: 0.2 mg/dL (ref 0.0–1.0)

## 2010-07-04 NOTE — Op Note (Signed)
NAMEJAYRA, [name]              ACCOUNT NO.:  [id number]   MEDICAL RECORD NO.:  [id number]          PATIENT TYPE:  AMB   LOCATION:  ENDO                         FACILITY:  [facility]   PHYSICIAN:  Anselmo Rod, M.D.  DATE OF BIRTH:  Oct 22, 1940   DATE OF PROCEDURE:  06/04/2004  DATE OF DISCHARGE:                                 OPERATIVE REPORT   PROCEDURES PERFORMED:  Colonoscopy with cold biopsies x 4.   ENDOSCOPIST:  Anselmo Rod, M.D.   INSTRUMENT USED:  Olympus video colonoscope.   INDICATION FOR PROCEDURE:  A 70 year old African American female undergoing  a screening colonoscopy.  The patient has a family history of colon cancer  and is undergoing a surveillance colonoscopy to rule out colonic polyps,  masses, etc.   PREPROCEDURE PREPARATION:  Informed consent was procured from the patient.  The patient fasted for eight hours prior to the procedure and prepped with a  bottle of magnesium citrate and a gallon of GoLYTELY the night prior to the  procedure.  The risks and benefits of the procedure, including a 10% missed  rate of cancer and polyps, were discussed with her as well.  She was given a  gram of Ancef for prophylaxis.   PREPROCEDURE PHYSICAL EXAMINATION:  VITAL SIGNS:  Stable.  NECK:  Supple.  CHEST:  Clear to auscultation.  HEART:  S1 and S2 regular.  ABDOMEN:  Soft with normal bowel sounds.   DESCRIPTION OF PROCEDURE:  The patient was placed in the left lateral  decubitus position.  Sedated with 70 mg of Demerol and 7.5 mg of Versed in  slow incremental doses.  Once the patient was adequately sedated and  maintained on low-flow oxygen and continuous cardiac monitoring, the Olympus  video colonoscope was advanced from the rectum to the cecum.  Prominent  internal hemorrhoids were seen on retroflexion.  Multiple washings were  done.  Four small sessile polyps were biopsied from the rectosigmoid colon.  The rest of the exam was unremarkable.  The patient  tolerated the procedure  without complications.   IMPRESSION:  1.  Prominent nonbleeding internal hemorrhoids.  2.  Four small sessile polyps biopsied from the rectosigmoid colon.  3.  Normal-appearing proximal left colon, transverse colon, right colon and      cecum.   RECOMMENDATIONS:  1.  Await pathology results.  2.  Repeat colonoscopy depending on pathology results.  3.  Avoid nonsteroidals, including aspirin, for the next two weeks.  4.  Outpatient followup as the need arises in the future.      JNM/MEDQ  D:  06/04/2004  T:  06/04/2004  Job:  [id number]   cc:   Gaetano Hawthorne. Lily Peer, M.D.  [address], [address]  [place]  [place] [address]  Fax: [phone]   Idamae Schuller A. Milinda Antis, M.D. [facility]

## 2010-08-16 ENCOUNTER — Encounter: Payer: Self-pay | Admitting: Family Medicine

## 2010-08-21 ENCOUNTER — Telehealth: Payer: Self-pay | Admitting: Family Medicine

## 2010-08-21 DIAGNOSIS — E7800 Pure hypercholesterolemia, unspecified: Secondary | ICD-10-CM

## 2010-08-21 DIAGNOSIS — I10 Essential (primary) hypertension: Secondary | ICD-10-CM

## 2010-08-21 DIAGNOSIS — R7309 Other abnormal glucose: Secondary | ICD-10-CM

## 2010-08-21 NOTE — Telephone Encounter (Signed)
Message copied by Judy Pimple on Thu Aug 21, 2010  8:31 AM ------      Message from: Baldomero Lamy      Created: Tue Aug 19, 2010 12:09 PM      Regarding: Labs Friday       Please order  future labs for pt's upcomming lab appt.      Thanks      Rodney Booze

## 2010-08-22 ENCOUNTER — Other Ambulatory Visit (INDEPENDENT_AMBULATORY_CARE_PROVIDER_SITE_OTHER): Payer: Medicare Other | Admitting: Family Medicine

## 2010-08-22 DIAGNOSIS — R7309 Other abnormal glucose: Secondary | ICD-10-CM

## 2010-08-22 DIAGNOSIS — I10 Essential (primary) hypertension: Secondary | ICD-10-CM

## 2010-08-22 DIAGNOSIS — E7800 Pure hypercholesterolemia, unspecified: Secondary | ICD-10-CM

## 2010-08-22 LAB — COMPREHENSIVE METABOLIC PANEL
Albumin: 4.2 g/dL (ref 3.5–5.2)
BUN: 12 mg/dL (ref 6–23)
Calcium: 9.4 mg/dL (ref 8.4–10.5)
Chloride: 108 mEq/L (ref 96–112)
Creatinine, Ser: 1 mg/dL (ref 0.4–1.2)
GFR: 68.46 mL/min (ref >60.00)
Glucose, Bld: 108 mg/dL — ABNORMAL HIGH (ref 70–99)
Potassium: 3.6 mEq/L (ref 3.5–5.1)

## 2010-08-22 LAB — CBC WITH DIFFERENTIAL/PLATELET
Basophils Relative: 0.4 % (ref 0.0–3.0)
Eosinophils Relative: 1.8 % (ref 0.0–5.0)
Hemoglobin: 13.1 g/dL (ref 12.0–15.0)
Lymphocytes Relative: 37.8 % (ref 12.0–46.0)
MCHC: 34.3 g/dL (ref 30.0–36.0)
MCV: 84.2 fl (ref 78.0–100.0)
Neutro Abs: 3.8 10*3/uL (ref 1.4–7.7)
Neutrophils Relative %: 51.8 % (ref 43.0–77.0)
RBC: 4.52 Mil/uL (ref 3.87–5.11)
WBC: 7.3 10*3/uL (ref 4.5–10.5)

## 2010-08-22 LAB — TSH: TSH: 1.88 u[IU]/mL (ref 0.35–5.50)

## 2010-08-22 LAB — LIPID PANEL
Cholesterol: 208 mg/dL — ABNORMAL HIGH (ref 0–200)
Total CHOL/HDL Ratio: 2
Triglycerides: 83 mg/dL (ref 0.0–149.0)

## 2010-08-25 ENCOUNTER — Ambulatory Visit (INDEPENDENT_AMBULATORY_CARE_PROVIDER_SITE_OTHER): Payer: Medicare Other | Admitting: Family Medicine

## 2010-08-25 ENCOUNTER — Encounter: Payer: Self-pay | Admitting: Family Medicine

## 2010-08-25 DIAGNOSIS — I10 Essential (primary) hypertension: Secondary | ICD-10-CM

## 2010-08-25 DIAGNOSIS — E7800 Pure hypercholesterolemia, unspecified: Secondary | ICD-10-CM

## 2010-08-25 DIAGNOSIS — R7309 Other abnormal glucose: Secondary | ICD-10-CM

## 2010-08-25 NOTE — Progress Notes (Signed)
Subjective:    Patient ID: [name], female    DOB: [birth year], 70 y.o.   MRN: [id number]  HPI Here for f/u of HTN and lipids and hyperglycemia  Is feeling ok    Wt is up 9 lb  HTN in good control with 134/76 today No change in med No ha or cp or sob   Hyperglycemia- now in DM range - A1c up from 6.4 to 6.6- overall fairly stable Had been eating too much lately -- needs to get back to what she was doing  Some sweets too  Is getting exercise -- regular walking early in the am  opthy is up to date   Lipids are fair with LDL 114 that is down Lab Results  Component Value Date   CHOL 208* 08/22/2010   CHOL 245* 02/19/2010   CHOL 186 08/20/2009   Lab Results  Component Value Date   HDL 89.60 08/22/2010   HDL 93.40 02/19/2010   HDL 71.60 08/20/2009   Lab Results  Component Value Date   LDLCALC 98 08/20/2009   Lab Results  Component Value Date   TRIG 83.0 08/22/2010   TRIG 57.0 02/19/2010   TRIG 82.0 08/20/2009   Lab Results  Component Value Date   CHOLHDL 2 08/22/2010   CHOLHDL 3 02/19/2010   CHOLHDL 3 08/20/2009   Lab Results  Component Value Date   LDLDIRECT 114.6 08/22/2010   LDLDIRECT 131.0 02/19/2010   LDLDIRECT 117.6 05/14/2009    Patient Active Problem List  Diagnoses  . FIBROIDS, UTERUS  . HYPERCHOLESTEROLEMIA  . HYPERTENSION  . POSTMENOPAUSAL STATUS  . MURMUR  . DM type 2 (diabetes mellitus, type 2)  . CERVICAL RADICULOPATHY, LEFT   Past Medical History  Diagnosis Date  . Hypertension   . 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 Hyperlipidemia   . Heart murmur   . Fibroids   . Gallstones     incidental asymptomatic gallstones   No past surgical history on file. History  Substance Use Topics  . Smoking status: Never Smoker   . Smokeless tobacco: Not on file  . Alcohol Use: No   Family History  Problem Relation Age of Onset  . Hypertension Mother   . Diabetes Mother   . Osteoporosis Sister   . Diabetes Brother    Allergies  Allergen Reactions  .  Ibuprofen     REACTION: palpitations  . Sulfamethoxazole W/Trimethoprim     REACTION: rash   Current Outpatient Prescriptions on File Prior to Visit  Medication Sig Dispense Refill  . Blood Glucose Monitoring Suppl (ACCU-CHEK AVIVA PLUS) W/DEVICE KIT Check glucose once daily and as needed for DM 250.0       . Calcium Carbonate (CALTRATE 600) 1500 MG TABS Take 1 tablet by mouth daily.        Marland Kitchen glucose blood (ACCU-CHEK AVIVA) test strip Use as directed to check glucose once daily and as needed DM 250.0       . Lancets (ACCU-CHEK MULTICLIX) lancets Use as instructed       . lisinopril (PRINIVIL,ZESTRIL) 40 MG tablet Take 40 mg by mouth daily.        . Multiple Vitamin (MULTIVITAMIN) tablet Take 1 tablet by mouth daily.        . Omega-3 Fatty Acids (FISH OIL) 1000 MG CAPS Take 1 capsule by mouth daily.        Marland Kitchen aspirin 81 MG tablet Take 81 mg by mouth daily.        Marland Kitchen  cyclobenzaprine (FLEXERIL) 10 MG tablet Take 5-10 mg by mouth at bedtime as needed.        . diclofenac (VOLTAREN) 75 MG EC tablet Take 75 mg by mouth 2 (two) times daily as needed.               Review of Systems Review of Systems  Constitutional: Negative for fever, appetite change, fatigue and unexpected weight change.  Eyes: Negative for pain and visual disturbance.  Respiratory: Negative for cough and shortness of breath.   Cardiovascular: Negative.for cp or sob    Gastrointestinal: Negative for nausea, diarrhea and constipation.  Genitourinary: Negative for urgency and frequency.  Skin: Negative for pallor.  Neurological: Negative for weakness, light-headedness, numbness and headaches.  Hematological: Negative for adenopathy. Does not bruise/bleed easily.  Psychiatric/Behavioral: Negative for dysphoric mood. The patient is not nervous/anxious.          Objective:   Physical Exam  Constitutional: She appears well-developed and well-nourished. No distress.  HENT:  Head: Normocephalic and atraumatic.    Mouth/Throat: Oropharynx is clear and moist.  Eyes: Conjunctivae and EOM are normal. Pupils are equal, round, and reactive to light.  Neck: Normal range of motion. Neck supple. No JVD present. Carotid bruit is not present. Erythema present. No thyromegaly present.  Cardiovascular: Normal rate, regular rhythm and intact distal pulses.   Murmur heard. Pulmonary/Chest: Effort normal and breath sounds normal. No respiratory distress. She has no wheezes.  Abdominal: Soft. Bowel sounds are normal. She exhibits no distension, no abdominal bruit and no mass. There is no tenderness.  Musculoskeletal: She exhibits no edema and no tenderness.  Lymphadenopathy:    She has no cervical adenopathy.  Neurological: She is alert. She has normal reflexes. Coordination normal.  Skin: Skin is warm and dry. No rash noted. No erythema. No pallor.  Psychiatric: She has a normal mood and affect.          Assessment & Plan:

## 2010-08-25 NOTE — Patient Instructions (Signed)
Try to stick to diabetic diet - eat smaller portions and less bread  Work on weight loss  Keep up the good exercise Follow up in 6 months for a 30 minute check up (physical) with labs prior No change in medicines

## 2010-08-25 NOTE — Assessment & Plan Note (Signed)
Well controlled with current med - ace  No changes Urged to keep up walking  Rev labs with pt  F/u 6 mo

## 2010-08-25 NOTE — Assessment & Plan Note (Signed)
Very mild with A1c of 6.6- this is up with recent wt gain and poor diet Enc pt to go back through her literature and get back to DM diet  Enc exercise  Enc wt loss to get back to baseline  Rev low glycemic diet  F/u 6 mo after labs

## 2010-08-25 NOTE — Assessment & Plan Note (Signed)
This is improved with LDL in one teens but also very high HDL in 80s Overall fair balance Disc low sat fat diet  F/u 6 mo after labs Disc goals for chol

## 2011-02-15 ENCOUNTER — Telehealth: Payer: Self-pay | Admitting: Family Medicine

## 2011-02-15 DIAGNOSIS — E119 Type 2 diabetes mellitus without complications: Secondary | ICD-10-CM

## 2011-02-15 DIAGNOSIS — E7800 Pure hypercholesterolemia, unspecified: Secondary | ICD-10-CM

## 2011-02-15 DIAGNOSIS — I10 Essential (primary) hypertension: Secondary | ICD-10-CM

## 2011-02-15 NOTE — Telephone Encounter (Signed)
Message copied by Judy Pimple on Sun Feb 15, 2011 12:55 PM ------      Message from: Alvina Chou      Created: Wed Feb 11, 2011  9:31 AM      Regarding: lab orders for 02-18-11       Patient is scheduled for CPX labs, please order future labs, Thanks , Camelia Eng

## 2011-02-16 ENCOUNTER — Telehealth: Payer: Self-pay | Admitting: *Deleted

## 2011-02-16 NOTE — Telephone Encounter (Signed)
Form for diabetic testing supplies in your in box.  I tried to phone the patient to signify that this is the company that she'd like to use for her supplies.  LMOVM at home and cell number.

## 2011-02-16 NOTE — Telephone Encounter (Signed)
I won't fill it out until pt gives the ok

## 2011-02-18 ENCOUNTER — Other Ambulatory Visit (INDEPENDENT_AMBULATORY_CARE_PROVIDER_SITE_OTHER): Payer: Medicare Other

## 2011-02-18 DIAGNOSIS — E119 Type 2 diabetes mellitus without complications: Secondary | ICD-10-CM

## 2011-02-18 DIAGNOSIS — E7800 Pure hypercholesterolemia, unspecified: Secondary | ICD-10-CM

## 2011-02-18 DIAGNOSIS — I10 Essential (primary) hypertension: Secondary | ICD-10-CM

## 2011-02-18 LAB — CBC WITH DIFFERENTIAL/PLATELET
Basophils Relative: 0.6 % (ref 0.0–3.0)
Eosinophils Relative: 2 % (ref 0.0–5.0)
HCT: 37.3 % (ref 36.0–46.0)
Hemoglobin: 12.4 g/dL (ref 12.0–15.0)
MCV: 84.4 fl (ref 78.0–100.0)
Monocytes Absolute: 0.9 10*3/uL (ref 0.1–1.0)
Neutro Abs: 7 10*3/uL (ref 1.4–7.7)
Neutrophils Relative %: 64 % (ref 43.0–77.0)
RBC: 4.42 Mil/uL (ref 3.87–5.11)
WBC: 11 10*3/uL — ABNORMAL HIGH (ref 4.5–10.5)

## 2011-02-18 LAB — COMPREHENSIVE METABOLIC PANEL
Albumin: 3.5 g/dL (ref 3.5–5.2)
Alkaline Phosphatase: 72 U/L (ref 39–117)
BUN: 13 mg/dL (ref 6–23)
Calcium: 9.5 mg/dL (ref 8.4–10.5)
Creatinine, Ser: 1 mg/dL (ref 0.4–1.2)
Glucose, Bld: 110 mg/dL — ABNORMAL HIGH (ref 70–99)
Potassium: 4 mEq/L (ref 3.5–5.1)

## 2011-02-18 LAB — LIPID PANEL
Cholesterol: 159 mg/dL (ref 0–200)
VLDL: 12.2 mg/dL (ref 0.0–40.0)

## 2011-02-18 LAB — HEMOGLOBIN A1C: Hgb A1c MFr Bld: 6.6 % — ABNORMAL HIGH (ref 4.6–6.5)

## 2011-02-18 NOTE — Telephone Encounter (Signed)
Sp w/ says she is presently using a Diabetic Supply  Company for her supplies, she did not know the name. Says she does not need any supplies at this time. She will discuss on appointment date. cdavis

## 2011-02-18 NOTE — Telephone Encounter (Signed)
Thanks- I am going to shred the sheet I have from prescriptions plus

## 2011-02-25 ENCOUNTER — Other Ambulatory Visit (HOSPITAL_COMMUNITY)
Admission: RE | Admit: 2011-02-25 | Discharge: 2011-02-25 | Disposition: A | Discharge status: Home / Self Care | Payer: Medicare Other | Source: Ambulatory Visit | Attending: Family Medicine | Admitting: Family Medicine

## 2011-02-25 ENCOUNTER — Encounter: Payer: Self-pay | Admitting: Family Medicine

## 2011-02-25 ENCOUNTER — Ambulatory Visit (INDEPENDENT_AMBULATORY_CARE_PROVIDER_SITE_OTHER): Payer: Medicare Other | Admitting: Family Medicine

## 2011-02-25 VITALS — BP 138/82 | HR 72 | Temp 98.0°F | Ht 60.75 in | Wt 142.0 lb

## 2011-02-25 DIAGNOSIS — E119 Type 2 diabetes mellitus without complications: Secondary | ICD-10-CM

## 2011-02-25 DIAGNOSIS — E7800 Pure hypercholesterolemia, unspecified: Secondary | ICD-10-CM

## 2011-02-25 DIAGNOSIS — Z1231 Encounter for screening mammogram for malignant neoplasm of breast: Secondary | ICD-10-CM | POA: Insufficient documentation

## 2011-02-25 DIAGNOSIS — Z01419 Encounter for gynecological examination (general) (routine) without abnormal findings: Secondary | ICD-10-CM | POA: Insufficient documentation

## 2011-02-25 DIAGNOSIS — I10 Essential (primary) hypertension: Secondary | ICD-10-CM

## 2011-02-25 DIAGNOSIS — Z1159 Encounter for screening for other viral diseases: Secondary | ICD-10-CM | POA: Insufficient documentation

## 2011-02-25 DIAGNOSIS — N950 Postmenopausal bleeding: Secondary | ICD-10-CM

## 2011-02-25 MED ORDER — LISINOPRIL 40 MG PO TABS: 40.0000 mg | ORAL_TABLET | Freq: Every day | ORAL | Status: DC

## 2011-02-25 NOTE — Progress Notes (Signed)
Subjective:    Patient ID: [name], female    DOB: [birth year], 71 y.o.   MRN: [id number]  HPI Here for check up of chronic medical conditions and to review health mt list  Is feeling good - nothing new   bp is 138/82     Today No cp or palpitations or headaches or edema  No side effects to medicines    DM  Lab Results  Component Value Date   HGBA1C 6.6* 02/18/2011    Diet controlled- is trying to stay away from sweets and starches  bmi is 27  Less exercise - cold outside  opthy was 03/24/10- all was ok   Zoster status- ? Interested in vaccine  Flu shot - had it in October -- [place] [facility] 2/12- is due next month -- Yolanda Bonine Self exam- lon lumps or changes   Pap 6/10 No gyn problems  In nov - had 2 days of spotting - and never happened again - was light and mild   colonosc 06 polyp-- then had her colonosc 5 years later  Goes to [facility] -- Dr Elsie Amis  Td 05  dexa 03 Not always taking ca and D   Lab Results  Component Value Date   CHOL 159 02/18/2011   HDL 53.10 02/18/2011   LDLCALC 94 02/18/2011   LDLDIRECT 114.6 08/22/2010   TRIG 61.0 02/18/2011   CHOLHDL 3 02/18/2011     Lab Results  Component Value Date   WBC 11.0* 02/18/2011   HGB 12.4 02/18/2011   HCT 37.3 02/18/2011   MCV 84.4 02/18/2011   PLT 344.0 02/18/2011   has had a bit of stress  Also got over a cold / laryngitis - at the time - is feeling better   Patient Active Problem List  Diagnoses  . FIBROIDS, UTERUS  . HYPERCHOLESTEROLEMIA  . HYPERTENSION  . POSTMENOPAUSAL STATUS  . MURMUR  . DM type 2 (diabetes mellitus, type 2)  . CERVICAL RADICULOPATHY, LEFT  . Other screening mammogram  . Gynecological examination  . Post-menopausal bleeding   Past Medical History  Diagnosis Date  . Hypertension   . 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 Hyperlipidemia   . Heart murmur   . Fibroids   . Gallstones     incidental asymptomatic gallstones   No past surgical history on file. History  Substance Use Topics    . Smoking status: Never Smoker   . Smokeless tobacco: Not on file  . Alcohol Use: No   Family History  Problem Relation Age of Onset  . Hypertension Mother   . Diabetes Mother   . Osteoporosis Sister   . Diabetes Brother    Allergies  Allergen Reactions  . Ibuprofen     REACTION: palpitations  . Sulfamethoxazole W/Trimethoprim     REACTION: rash   Current Outpatient Prescriptions on File Prior to Visit  Medication Sig Dispense Refill  . Blood Glucose Monitoring Suppl (ACCU-CHEK AVIVA PLUS) W/DEVICE KIT Check glucose once daily and as needed for DM 250.0       . Calcium Carbonate (CALTRATE 600) 1500 MG TABS Take 1 tablet by mouth daily.        Marland Kitchen glucose blood (ACCU-CHEK AVIVA) test strip Use as directed to check glucose once daily and as needed DM 250.0       . Lancets (ACCU-CHEK MULTICLIX) lancets Use as instructed       . Multiple Vitamin (MULTIVITAMIN) tablet Take 1 tablet by mouth  daily.        . Omega-3 Fatty Acids (FISH OIL) 1000 MG CAPS Take 1 capsule by mouth daily.        Marland Kitchen aspirin 81 MG tablet Take 81 mg by mouth daily.        . cyclobenzaprine (FLEXERIL) 10 MG tablet Take 5-10 mg by mouth at bedtime as needed.        . diclofenac (VOLTAREN) 75 MG EC tablet Take 75 mg by mouth 2 (two) times daily as needed.            Review of Systems Review of Systems  Constitutional: Negative for fever, appetite change, fatigue and unexpected weight change.  Eyes: Negative for pain and visual disturbance.  Respiratory: Negative for cough and shortness of breath.   Cardiovascular: Negative for cp or palpitations    Gastrointestinal: Negative for nausea, diarrhea and constipation.  Genitourinary: Negative for urgency and frequency.  Skin: Negative for pallor or rash   Neurological: Negative for weakness, light-headedness, numbness and headaches.  Hematological: Negative for adenopathy. Does not bruise/bleed easily.  Psychiatric/Behavioral: Negative for dysphoric mood. The  patient is not nervous/anxious.          Objective:   Physical Exam  Constitutional: She appears well-developed and well-nourished. No distress.  HENT:  Head: Normocephalic and atraumatic.  Right Ear: External ear normal.  Left Ear: External ear normal.  Nose: Nose normal.  Mouth/Throat: Oropharynx is clear and moist.  Eyes: Conjunctivae and EOM are normal. Pupils are equal, round, and reactive to light. No scleral icterus.  Neck: Normal range of motion. Neck supple. No JVD present. Carotid bruit is not present. No thyromegaly present.  Cardiovascular: Normal rate, regular rhythm, normal heart sounds and intact distal pulses.  Exam reveals no gallop.   Pulmonary/Chest: Effort normal and breath sounds normal. No respiratory distress. She has no wheezes. She exhibits no tenderness.  Abdominal: Soft. Bowel sounds are normal. She exhibits no distension, no abdominal bruit and no mass. There is no tenderness.       No suprapubic tenderness    Genitourinary: Rectum normal, vagina normal and uterus normal. No breast swelling, tenderness, discharge or bleeding. There is no rash on the right labia. There is no rash on the left labia. Uterus is not tender. Cervix exhibits no motion tenderness, no discharge and no friability. Right adnexum displays no mass, no tenderness and no fullness. Left adnexum displays no mass, no tenderness and no fullness. No erythema or bleeding around the vagina. No vaginal discharge found.       Breast exam: No mass, nodules, thickening, tenderness, bulging, retraction, inflamation, nipple discharge or skin changes noted.  No axillary or clavicular LA.  Chaperoned exam.    o External genitalia - normal appearing  o Urethral meatus normal appearing  o Urethra  Normal/ mobile  o Bladder mild cystocele/ nontender  o Vagina nl mucosa and appearance  o Cervix  Nl appearing , some scant spotting on pap o Uterus  nontender o Adnexa/parametria -no masses o Anus and perineum  -nl appearing  Musculoskeletal: Normal range of motion. She exhibits no edema and no tenderness.  Lymphadenopathy:    She has no cervical adenopathy.  Neurological: She is alert. She has normal reflexes. No cranial nerve deficit. She exhibits normal muscle tone. Coordination normal.  Skin: Skin is warm and dry. No rash noted. No erythema. No pallor.  Psychiatric: She has a normal mood and affect.  Assessment & Plan:

## 2011-02-25 NOTE — Patient Instructions (Addendum)
If you are interested in a shingles/zoster vaccine - call your insurance to check on coverage,( you should not get it within 1 month of other vaccines) , then call us for a prescription  for it to take to a pharmacy that gives the shot   We will schedule mammogram at check out Try to get 1200-1500 mg of calcium per day with at least 1000 iu of vitamin D - for bone health  If you have fever or any other symptoms of infection let me know  If you have any vaginal spotting or bleeding beyond the next several days - please alert me

## 2011-02-26 NOTE — Assessment & Plan Note (Signed)
Scheduled annual screening mammogram Nl breast exam today  Encouraged monthly self exams   

## 2011-02-26 NOTE — Assessment & Plan Note (Signed)
Lab Results  Component Value Date   CHOL 159 02/18/2011   HDL 53.10 02/18/2011   LDLCALC 94 02/18/2011   LDLDIRECT 114.6 08/22/2010   TRIG 61.0 02/18/2011   CHOLHDL 3 02/18/2011   controlled with diet currently Rev low sat fat diet

## 2011-02-26 NOTE — Assessment & Plan Note (Signed)
Stable and diet controlled with good habits opthy up to date  On ace  Rev low glycemic diet

## 2011-02-26 NOTE — Assessment & Plan Note (Signed)
Very brief with hx of fibroids Pending pap If re occurs will want to get [place] - pt aware and will update if any problems re occur

## 2011-02-26 NOTE — Assessment & Plan Note (Signed)
Pap done  ? Of spotting last mo Known fibroids- not appreciated on exam

## 2011-02-26 NOTE — Assessment & Plan Note (Signed)
bp in fair control at this time  No changes needed  Disc lifstyle change with low sodium diet and exercise   

## 2011-02-27 ENCOUNTER — Emergency Department (HOSPITAL_COMMUNITY)
Admission: EM | Admit: 2011-02-27 | Discharge: 2011-02-27 | Disposition: A | Discharge status: Home / Self Care | Payer: Medicare Other | Attending: Emergency Medicine | Admitting: Emergency Medicine

## 2011-02-27 ENCOUNTER — Ambulatory Visit (INDEPENDENT_AMBULATORY_CARE_PROVIDER_SITE_OTHER): Payer: Medicare Other | Admitting: Family Medicine

## 2011-02-27 ENCOUNTER — Telehealth: Payer: Self-pay | Admitting: *Deleted

## 2011-02-27 ENCOUNTER — Ambulatory Visit (INDEPENDENT_AMBULATORY_CARE_PROVIDER_SITE_OTHER)
Admission: RE | Admit: 2011-02-27 | Discharge: 2011-02-27 | Disposition: A | Discharge status: Home / Self Care | Payer: Medicare Other | Source: Ambulatory Visit | Attending: Family Medicine | Admitting: Family Medicine

## 2011-02-27 ENCOUNTER — Telehealth: Payer: Self-pay | Admitting: Family Medicine

## 2011-02-27 ENCOUNTER — Encounter (HOSPITAL_COMMUNITY): Payer: Self-pay | Admitting: *Deleted

## 2011-02-27 ENCOUNTER — Encounter: Payer: Self-pay | Admitting: Family Medicine

## 2011-02-27 VITALS — BP 152/82 | HR 85 | Temp 98.0°F | Wt 144.5 lb

## 2011-02-27 DIAGNOSIS — M7989 Other specified soft tissue disorders: Secondary | ICD-10-CM

## 2011-02-27 DIAGNOSIS — M25519 Pain in unspecified shoulder: Secondary | ICD-10-CM | POA: Insufficient documentation

## 2011-02-27 DIAGNOSIS — R222 Localized swelling, mass and lump, trunk: Secondary | ICD-10-CM | POA: Insufficient documentation

## 2011-02-27 DIAGNOSIS — E785 Hyperlipidemia, unspecified: Secondary | ICD-10-CM | POA: Insufficient documentation

## 2011-02-27 DIAGNOSIS — E119 Type 2 diabetes mellitus without complications: Secondary | ICD-10-CM | POA: Insufficient documentation

## 2011-02-27 DIAGNOSIS — R209 Unspecified disturbances of skin sensation: Secondary | ICD-10-CM | POA: Insufficient documentation

## 2011-02-27 DIAGNOSIS — R221 Localized swelling, mass and lump, neck: Secondary | ICD-10-CM

## 2011-02-27 DIAGNOSIS — I10 Essential (primary) hypertension: Secondary | ICD-10-CM | POA: Insufficient documentation

## 2011-02-27 DIAGNOSIS — Z79899 Other long term (current) drug therapy: Secondary | ICD-10-CM | POA: Insufficient documentation

## 2011-02-27 DIAGNOSIS — M25419 Effusion, unspecified shoulder: Secondary | ICD-10-CM | POA: Insufficient documentation

## 2011-02-27 DIAGNOSIS — R220 Localized swelling, mass and lump, head: Secondary | ICD-10-CM

## 2011-02-27 LAB — POCT I-STAT, CHEM 8
Chloride: 106 mEq/L (ref 96–112)
Creatinine, Ser: 1 mg/dL (ref 0.50–1.10)
Glucose, Bld: 87 mg/dL (ref 70–99)
Hemoglobin: 14.6 g/dL (ref 12.0–15.0)
Potassium: 3.9 mEq/L (ref 3.5–5.1)

## 2011-02-27 LAB — CBC
HCT: 41.8 % (ref 36.0–46.0)
Hemoglobin: 14 g/dL (ref 12.0–15.0)
MCH: 27.6 pg (ref 26.0–34.0)
RBC: 5.08 MIL/uL (ref 3.87–5.11)

## 2011-02-27 LAB — DIFFERENTIAL
Eosinophils Absolute: 0.1 10*3/uL (ref 0.0–0.7)
Lymphs Abs: 3.1 10*3/uL (ref 0.7–4.0)
Monocytes Absolute: 0.5 10*3/uL (ref 0.1–1.0)
Monocytes Relative: 4 % (ref 3–12)
Neutrophils Relative %: 69 % (ref 43–77)

## 2011-02-27 MED ORDER — IOHEXOL 350 MG/ML SOLN
100.0000 mL | Freq: Once | INTRAVENOUS | Status: AC | PRN
  Administered 2011-02-27: 100 mL via INTRAVENOUS

## 2011-02-27 NOTE — Progress Notes (Signed)
  Subjective:    Patient ID: [name], female    DOB: 04/30/1940, 71 y.o.   MRN: [id number]  HPI 71 yo pt of Dr. Royden Purl new to me here for acute onset of left neck/supraclavicular swelling.  No new medications, no trauma.  Not painful but she noticed it looks different than her right side.  No erythema, no warmth, no fever. Never had anything like this before.  She just had CPX with PCP two days ago.  Patient Active Problem List  Diagnoses  . FIBROIDS, UTERUS  . HYPERCHOLESTEROLEMIA  . HYPERTENSION  . POSTMENOPAUSAL STATUS  . MURMUR  . DM type 2 (diabetes mellitus, type 2)  . CERVICAL RADICULOPATHY, LEFT  . Other screening mammogram  . Gynecological examination  . Post-menopausal bleeding   Past Medical History  Diagnosis Date  . Hypertension   . 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 Hyperlipidemia   . Heart murmur   . Fibroids   . Gallstones     incidental asymptomatic gallstones   No past surgical history on file. History  Substance Use Topics  . Smoking status: Never Smoker   . Smokeless tobacco: Not on file  . Alcohol Use: No   Family History  Problem Relation Age of Onset  . Hypertension Mother   . Diabetes Mother   . Osteoporosis Sister   . Diabetes Brother    Allergies  Allergen Reactions  . Ibuprofen     REACTION: palpitations  . Sulfamethoxazole W/Trimethoprim     REACTION: rash   Current Outpatient Prescriptions on File Prior to Visit  Medication Sig Dispense Refill  . Blood Glucose Monitoring Suppl (ACCU-CHEK AVIVA PLUS) W/DEVICE KIT Check glucose once daily and as needed for DM 250.0       . glucose blood (ACCU-CHEK AVIVA) test strip Use as directed to check glucose once daily and as needed DM 250.0       . Lancets (ACCU-CHEK MULTICLIX) lancets Use as instructed       . lisinopril (PRINIVIL,ZESTRIL) 40 MG tablet Take 1 tablet (40 mg total) by mouth daily.  30 tablet  11  . Multiple Vitamin (MULTIVITAMIN) tablet Take 1 tablet by mouth  daily.        . Omega-3 Fatty Acids (FISH OIL) 1000 MG CAPS Take 1 capsule by mouth daily.         The PMH, PSH, Social History, Family History, Medications, and allergies have been reviewed in [facility], and have been updated if relevant.   Review of Systems See HPI No neck pain No CP, no SOB, no dizziness.    Objective:   Physical Exam BP 152/82  Pulse 85  Temp(Src) 98 F (36.7 C) (Oral)  Wt 144 lb 8 oz (65.545 kg) Gen:  Alert, pleasant, NAD, appears non toxic, hemodynamically stable. HEENT:  ?pos faint carotid bruit on left +diffuse swelling of supraclavicular area, non tender, no firm masses, no erythema. FROM of neck. CVS:  RRR Ext:  No edema.     Assessment & Plan:   1. Fullness/swelling of supraclavicular fossa  CT Angio Neck W/Cm &/Or Wo/Cm   New.   Etiology unclear but I am concerned for acute vascular issue, like an aneurysm. Discussed with radiology, will order stat CT angiogram of neck. The patient indicates understanding of these issues and agrees with the plan.

## 2011-02-27 NOTE — Telephone Encounter (Signed)
Form received for diabetic supplies. Placed in your IN box for completion

## 2011-02-27 NOTE — Telephone Encounter (Signed)
Done in IN box-thanks

## 2011-02-27 NOTE — Telephone Encounter (Signed)
Completed form faxed to [phone] as instructed. Form sent to be scanned.

## 2011-02-27 NOTE — ED Notes (Signed)
Pt resting quietly no s/s of any pain or distress noted or reported at this time. Pt denies any pain or complaints, pt has swelling to left clavicle area with palpable radial pulse. Pt family reports ultrasound was completed at bedside. Plan of care is updated including diet order with verbal understanding. Pt is awaiting test results for disposition, will continue to monitor pt.

## 2011-02-27 NOTE — ED Notes (Signed)
The pt has swelling in her lt shoulder just  Noticed this am.  She was just seen by her doctor Wednesday and the swelling was not there.  No pain.  She has had a c-t scan and was sent here from the scanner across the street.  She has an iv in her rt a-c that was started there

## 2011-02-27 NOTE — ED Provider Notes (Signed)
History     CSN: [id number]  Arrival date & time 02/27/11  1508   First MD Initiated Contact with Patient 02/27/11 1630      Chief Complaint  Patient presents with  . Shoulder Pain    (Consider location/radiation/quality/duration/timing/severity/associated sxs/prior treatment) HPI  Past Medical History  Diagnosis Date  . Hypertension   . 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 Hyperlipidemia   . Heart murmur   . Fibroids   . Gallstones     incidental asymptomatic gallstones    History reviewed. No pertinent past surgical history.  Family History  Problem Relation Age of Onset  . Hypertension Mother   . Diabetes Mother   . Osteoporosis Sister   . Diabetes Brother     History  Substance Use Topics  . Smoking status: Never Smoker   . Smokeless tobacco: Not on file  . Alcohol Use: No    OB History    Grav Para Term Preterm Abortions TAB SAB Ect Mult Living                  Review of Systems  Allergies  Ibuprofen and Sulfamethoxazole w/trimethoprim  Home Medications   Current Outpatient Rx  Name Route Sig Dispense Refill  . ACCU-CHEK AVIVA PLUS W/DEVICE KIT  Check glucose once daily and as needed for DM 250.0     . GLUCOSE BLOOD VI STRP  Use as directed to check glucose once daily and as needed DM 250.0     . ACCU-CHEK MULTICLIX LANCETS MISC  Use as instructed     . LISINOPRIL 40 MG PO TABS Oral Take 1 tablet (40 mg total) by mouth daily. 30 tablet 11  . ONE-DAILY MULTI VITAMINS PO TABS Oral Take 1 tablet by mouth daily.      Marland Kitchen FISH OIL 1000 MG PO CAPS Oral Take 1 capsule by mouth daily.        BP 148/86  Pulse 69  Temp(Src) 98.3 F (36.8 C) (Oral)  Resp 15  Ht 5\' 1"  (1.549 m)  Wt 140 lb (63.504 kg)  BMI 26.45 kg/m2  SpO2 98%  Physical Exam  ED Course  Procedures (including critical care time)  Labs Reviewed  CBC - Abnormal; Notable for the following:    WBC 11.6 (*)    All other components within normal limits  DIFFERENTIAL - Abnormal;  Notable for the following:    Neutro Abs 8.0 (*)    All other components within normal limits  POCT I-STAT, CHEM 8  I-STAT, CHEM 8   Ct Angio Neck W/cm &/or Wo/cm  02/27/2011  *RADIOLOGY REPORT*  Clinical Data:  New onset left neck swelling today.  CT ANGIOGRAPHY NECK  Technique:  Multidetector CT imaging of the neck was performed using the standard protocol during bolus administration of intravenous contrast.  Multiplanar CT image reconstructions including MIPs were obtained to evaluate the vascular anatomy. Carotid stenosis measurements (when applicable) are obtained utilizing NASCET criteria, using the distal internal carotid diameter as the denominator.  Contrast: OMNIPAQUE IOHEXOL 350 MG/ML IV SOLN  Comparison:  None  Findings:  There is diffuse edema in the left supraclavicular fossa in the subcutaneous and deep fat.  There is mild enlargement of the left subclavian vein suggestive of venous thrombosis in the vein. Unfortunately,   the study was tailored to evaluate the arteries and there is no contrast in the veins to confirm this finding. Additional imaging may be necessary to confirm.  Lung apices are clear.  No mass or adenopathy is present.  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  This is causing significant spinal stenosis.  I suspect there may be some cord compression at C4-5 and C5-6 due to spondylosis.  There is also ossification of  the posterior longitudinal ligament in the upper cervical spine.  Neurologic evaluation is suggested for myelopathy.  Mild atherosclerotic disease in the right carotid bulb without significant stenosis.  Minimal calcified plaque in the left carotid bulb without significant stenosis.  Both vertebral arteries are patent.  Negative for vertebral or carotid aneurysm.   Review of the MIP images confirms the above findings.  IMPRESSION: Subcutaneous and deep tissue edema in the  left clavicular fossa and left neck without underlying mass.  Left subclavian vein is mildly enlarged.  Findings are suspicious for acute deep venous thrombosis of the left subclavian vein.  Unfortunately, the study was performed to  evaluate the arterial system and there is no contrast in the veins.  I would suggest follow-up Doppler ultrasound examination of the left arm to   confirm   DVT.  No significant carotid stenosis or aneurysm is identified.  Original Report Authenticated By: Camelia Phenes, M.D.     1. Fullness of supraclavicular fossa       MDM  Medical screening examination/treatment/procedure(s) were conducted as a shared visit with non-physician practitioner(s) and myself.  I personally evaluated the patient during the encounter  Pt in no distress. Results reviewed. Can f/u as outpt.          Loren Racer, MD 02/27/11 2213

## 2011-02-27 NOTE — Progress Notes (Signed)
VASCULAR LAB PRELIMINARY  PRELIMINARY  PRELIMINARY  PRELIMINARY  Left upper extremity venous duplex completed.    Preliminary report:  Left:  No evidence of DVT or superficial thrombosis.   Jugular vein distention with rouleaux flow noted.  Terance Hart, RVT 02/27/2011, 7:08 PM

## 2011-02-27 NOTE — ED Provider Notes (Signed)
History     CSN: [id number]  Arrival date & time 02/27/11  1508   First MD Initiated Contact with Patient 02/27/11 1630      Chief Complaint  Patient presents with  . Shoulder Pain    (Consider location/radiation/quality/duration/timing/severity/associated sxs/prior treatment) HPI Comments: The patient noticed left suprapubic clavicular swelling this morning. No history of trauma. Went to her doctor, who sent her to get a CT scan of her neck. She was sent here after the CT scan for concern of upper extremity DVT.  Patient is a 71 y.o. female presenting with shoulder pain. The history is provided by the patient. No language interpreter was used.  Shoulder Pain This is a new problem. The current episode started today. The problem occurs constantly. The problem has been unchanged. Associated symptoms include numbness (Occasional tingling in L arm). Pertinent negatives include no abdominal pain, chills, coughing, fever, nausea, neck pain or vomiting. The symptoms are aggravated by nothing. She has tried nothing for the symptoms. The treatment provided no relief.    Past Medical History  Diagnosis Date  . Hypertension   . 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 Hyperlipidemia   . Heart murmur   . Fibroids   . Gallstones     incidental asymptomatic gallstones    History reviewed. No pertinent past surgical history.  Family History  Problem Relation Age of Onset  . Hypertension Mother   . Diabetes Mother   . Osteoporosis Sister   . Diabetes Brother     History  Substance Use Topics  . Smoking status: Never Smoker   . Smokeless tobacco: Not on file  . Alcohol Use: No    OB History    Grav Para Term Preterm Abortions TAB SAB Ect Mult Living                  Review of Systems  Constitutional: Negative for fever and chills.  HENT: Negative for neck pain.   Respiratory: Negative for cough.   Gastrointestinal: Negative for nausea, vomiting, abdominal pain and diarrhea.    Neurological: Positive for numbness (Occasional tingling in L arm).  All other systems reviewed and are negative.    Allergies  Ibuprofen and Sulfamethoxazole w/trimethoprim  Home Medications   Current Outpatient Rx  Name Route Sig Dispense Refill  . ACCU-CHEK AVIVA PLUS W/DEVICE KIT  Check glucose once daily and as needed for DM 250.0     . GLUCOSE BLOOD VI STRP  Use as directed to check glucose once daily and as needed DM 250.0     . ACCU-CHEK MULTICLIX LANCETS MISC  Use as instructed     . LISINOPRIL 40 MG PO TABS Oral Take 1 tablet (40 mg total) by mouth daily. 30 tablet 11  . ONE-DAILY MULTI VITAMINS PO TABS Oral Take 1 tablet by mouth daily.      Marland Kitchen FISH OIL 1000 MG PO CAPS Oral Take 1 capsule by mouth daily.        BP 176/87  Pulse 83  Temp 98.1 F (36.7 C)  Resp 18  Ht 5\' 1"  (1.549 m)  Wt 140 lb (63.504 kg)  BMI 26.45 kg/m2  SpO2 98%  Physical Exam  Nursing note and vitals reviewed. Constitutional: She is oriented to person, place, and time. She appears well-developed and well-nourished. No distress.  HENT:  Head: Normocephalic and atraumatic.  Eyes: EOM are normal. Pupils are equal, round, and reactive to light.  Neck: Normal range of  motion. Neck supple.       Left supraclavicular swelling. No tenderness.  Cardiovascular: Normal rate and regular rhythm.  Exam reveals no friction rub.   No murmur heard. Pulmonary/Chest: Effort normal and breath sounds normal. No respiratory distress. She has no wheezes. She has no rales.  Abdominal: Soft. She exhibits no distension. There is no tenderness. There is no rebound.  Musculoskeletal: Normal range of motion. She exhibits no edema.       Left arm neurovascularly intact.  Neurological: She is alert and oriented to person, place, and time.  Skin: Skin is warm and dry. She is not diaphoretic.    ED Course  Procedures (including critical care time)  Labs Reviewed - No data to display Ct Angio Neck W/cm &/or  Wo/cm  02/27/2011  *RADIOLOGY REPORT*  Clinical Data:  New onset left neck swelling today.  CT ANGIOGRAPHY NECK  Technique:  Multidetector CT imaging of the neck was performed using the standard protocol during bolus administration of intravenous contrast.  Multiplanar CT image reconstructions including MIPs were obtained to evaluate the vascular anatomy. Carotid stenosis measurements (when applicable) are obtained utilizing NASCET criteria, using the distal internal carotid diameter as the denominator.  Contrast: OMNIPAQUE IOHEXOL 350 MG/ML IV SOLN  Comparison:  None  Findings:  There is diffuse edema in the left supraclavicular fossa in the subcutaneous and deep fat.  There is mild enlargement of the left subclavian vein suggestive of venous thrombosis in the vein. Unfortunately,   the study was tailored to evaluate the arteries and there is no contrast in the veins to confirm this finding. Additional imaging may be necessary to confirm.  Lung apices are clear.  No mass or adenopathy is present.  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  This is causing significant spinal stenosis.  I suspect there may be some cord compression at C4-5 and C5-6 due to spondylosis.  There is also ossification of  the posterior longitudinal ligament in the upper cervical spine.  Neurologic evaluation is suggested for myelopathy.  Mild atherosclerotic disease in the right carotid bulb without significant stenosis.  Minimal calcified plaque in the left carotid bulb without significant stenosis.  Both vertebral arteries are patent.  Negative for vertebral or carotid aneurysm.   Review of the MIP images confirms the above findings.  IMPRESSION: Subcutaneous and deep tissue edema in the left clavicular fossa and left neck without underlying mass.  Left subclavian vein is mildly enlarged.  Findings are suspicious for acute deep venous thrombosis  of the left subclavian vein.  Unfortunately, the study was performed to  evaluate the arterial system and there is no contrast in the veins.  I would suggest follow-up Doppler ultrasound examination of the left arm to   confirm   DVT.  No significant carotid stenosis or aneurysm is identified.  Original Report Authenticated By: Camelia Phenes, M.D.     1. Fullness of supraclavicular fossa       MDM  Patient is a 71 year old female presents with left supraclavicular swelling which began today. Her primary care physician sent her to get a CTA of her neck, which showed a more the left supraclavicular vein concerning for a possible DVT. Patient sent to the ER from the CT scanner. The patient is afebrile and vital signs are stable. Lungs are clear. Patient has no trouble breathing, chest pain, cough. Left arm has good pulses and is neurovascularly intact.  Patient states occasional mild tingling in the arm but no gross sensory deficits. Will order her left upper extremity Doppler to look for clot. Will inform patient's PCP and she is here.  L upper extremity doppler negative for DVT. Rouleaux flow noted. No need for anti-coagulation or for further work-up as patient is asymptomatic at this time. Instructed to f/u with her primary doctor on Monday.  Discharged home in stable condition.  Elwin Mocha, MD 02/27/11 2051

## 2011-02-27 NOTE — ED Notes (Signed)
Pt denies any pain or questions upon discharge. 

## 2011-02-27 NOTE — Telephone Encounter (Signed)
ER Resident called--  Pt arrived in ER after CT angiogram for mass in neck----- Radiologist was sure it was a DVT but advised that a doppler be done.  If it is positive for DVT pt will be admitted.

## 2011-02-28 NOTE — ED Provider Notes (Signed)
I saw and evaluated the patient, reviewed the resident's note and I agree with the findings and plan.  Loren Racer, MD 02/28/11 1524

## 2011-03-01 NOTE — Telephone Encounter (Signed)
[place]

## 2011-03-02 ENCOUNTER — Ambulatory Visit (INDEPENDENT_AMBULATORY_CARE_PROVIDER_SITE_OTHER): Payer: Medicare Other | Admitting: Family Medicine

## 2011-03-02 ENCOUNTER — Encounter: Payer: Self-pay | Admitting: Family Medicine

## 2011-03-02 VITALS — BP 140/80 | HR 80 | Temp 98.4°F | Ht 61.0 in | Wt 145.0 lb

## 2011-03-02 DIAGNOSIS — R222 Localized swelling, mass and lump, trunk: Secondary | ICD-10-CM

## 2011-03-02 NOTE — Assessment & Plan Note (Signed)
Reviewed hx of this in detail as well as ER notes/ studies/ CT and doppler  What was at first thought to be DVT turned out not to be thankfully In addition the swelling is almost completely gone In retrospect - ? What the cause was /perhaps injury or strain  Overall - will watch carefully for re occurrence -if this happens again will likely refer to ENT for further eval since it is in the neck region

## 2011-03-02 NOTE — Patient Instructions (Signed)
I'm glad you are feeling well and the area in your neck has gone down  Let me know if it swells again Studies so far are reassuring  If symptoms return - we may consider specialist referral
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
## 2011-03-06 ENCOUNTER — Encounter: Payer: Self-pay | Admitting: *Deleted

## 2011-04-13 ENCOUNTER — Encounter: Payer: Self-pay | Admitting: Family Medicine

## 2011-07-29 ENCOUNTER — Telehealth: Payer: Self-pay | Admitting: Family Medicine

## 2011-07-29 NOTE — Telephone Encounter (Signed)
Patient notified as instructed by telephone. Patient transferred to [facility] in the front office to schedule appointments.

## 2011-07-29 NOTE — Telephone Encounter (Signed)
Have her f/u in July please with lab appt fasting prior thanks

## 2011-07-29 NOTE — Telephone Encounter (Signed)
Patient called and wanted to know when you want to see her for a follow up visit and if she needs to have lab work done before the appointment?

## 2011-08-23 ENCOUNTER — Telehealth: Payer: Self-pay | Admitting: Family Medicine

## 2011-08-23 DIAGNOSIS — E7800 Pure hypercholesterolemia, unspecified: Secondary | ICD-10-CM

## 2011-08-23 DIAGNOSIS — E119 Type 2 diabetes mellitus without complications: Secondary | ICD-10-CM

## 2011-08-23 DIAGNOSIS — I10 Essential (primary) hypertension: Secondary | ICD-10-CM

## 2011-08-23 NOTE — Telephone Encounter (Signed)
Message copied by Judy Pimple on Sun Aug 23, 2011 12:15 PM ------      Message from: Baldomero Lamy      Created: Mon Aug 17, 2011  1:48 PM      Regarding: f/u labs Mon 7/8       Please order  future f/u labs for pt's upcomming lab appt.      Thanks      Rodney Booze

## 2011-08-24 ENCOUNTER — Other Ambulatory Visit (INDEPENDENT_AMBULATORY_CARE_PROVIDER_SITE_OTHER): Payer: Medicare Other

## 2011-08-24 DIAGNOSIS — I10 Essential (primary) hypertension: Secondary | ICD-10-CM

## 2011-08-24 DIAGNOSIS — E119 Type 2 diabetes mellitus without complications: Secondary | ICD-10-CM

## 2011-08-24 DIAGNOSIS — E7800 Pure hypercholesterolemia, unspecified: Secondary | ICD-10-CM

## 2011-08-24 LAB — CBC WITH DIFFERENTIAL/PLATELET
Basophils Absolute: 0 10*3/uL (ref 0.0–0.1)
Eosinophils Absolute: 0.1 10*3/uL (ref 0.0–0.7)
HCT: 39.1 % (ref 36.0–46.0)
Hemoglobin: 12.9 g/dL (ref 12.0–15.0)
Lymphs Abs: 2.9 10*3/uL (ref 0.7–4.0)
MCHC: 32.9 g/dL (ref 30.0–36.0)
Neutro Abs: 7.6 10*3/uL (ref 1.4–7.7)
RDW: 14.6 % (ref 11.5–14.6)

## 2011-08-24 LAB — COMPREHENSIVE METABOLIC PANEL
ALT: 16 U/L (ref 0–35)
AST: 23 U/L (ref 0–37)
Alkaline Phosphatase: 68 U/L (ref 39–117)
Creatinine, Ser: 0.9 mg/dL (ref 0.4–1.2)
GFR: 80.79 mL/min (ref >60.00)
Total Bilirubin: 0.6 mg/dL (ref 0.3–1.2)

## 2011-08-24 LAB — LIPID PANEL
HDL: 91.8 mg/dL (ref >39.00)
Total CHOL/HDL Ratio: 2
Triglycerides: 100 mg/dL (ref 0.0–149.0)

## 2011-08-24 LAB — TSH: TSH: 4.37 u[IU]/mL (ref 0.35–5.50)

## 2011-08-26 ENCOUNTER — Ambulatory Visit (INDEPENDENT_AMBULATORY_CARE_PROVIDER_SITE_OTHER): Payer: Medicare Other | Admitting: Family Medicine

## 2011-08-26 ENCOUNTER — Encounter: Payer: Self-pay | Admitting: Family Medicine

## 2011-08-26 VITALS — BP 130/78 | HR 72 | Temp 97.9°F | Ht 61.0 in | Wt 145.8 lb

## 2011-08-26 DIAGNOSIS — E7800 Pure hypercholesterolemia, unspecified: Secondary | ICD-10-CM

## 2011-08-26 DIAGNOSIS — E119 Type 2 diabetes mellitus without complications: Secondary | ICD-10-CM

## 2011-08-26 DIAGNOSIS — I10 Essential (primary) hypertension: Secondary | ICD-10-CM

## 2011-08-26 NOTE — Assessment & Plan Note (Signed)
a1c stable at 6.7 with diet control Pt does not want med  Sugars are good  Disc inc exercise F/u 6 mo  utd opthy

## 2011-08-26 NOTE — Assessment & Plan Note (Signed)
bp in fair control at this time  No changes needed  Disc lifstyle change with low sodium diet and exercise  Labs reviewed  

## 2011-08-26 NOTE — Patient Instructions (Addendum)
Labs are stable  Keep eating low sugar and low fat diet  Check sugar occasionally in evening instead of am  Aim for exercise 5 days per week  Follow up for annual exam with labs prior in 6 months please

## 2011-08-26 NOTE — Progress Notes (Signed)
Subjective:    Patient ID: [name], female    DOB: Mar 20, 1940, 71 y.o.   MRN: [id number]  HPI Here for f/u of chronic problems  Is doing well Nothing new     bp is ok    Today BP Readings from Last 3 Encounters:  08/26/11 130/78  03/02/11 140/80  02/27/11 148/86    No cp or palpitations or headaches or edema  No side effects to medicines     Diabetes Home sugar results - at home -- avg in the am is usually low 100s , does not check in the afternoon or eve Highest is 133  DM diet - is doing ok with that - low sugar  Exercise - is "trying to" -- is working outdoors , some walking  Symptoms--none at all  A1C last  Lab Results  Component Value Date   HGBA1C 6.7* 08/24/2011    Diet controlled  Renal protection- on ace  Last eye exam -- up to date in jan , no problems   Considering shingles shot -- ins only pays some so will think about it     Wt is stable with bmi of 27  Lipids Lab Results  Component Value Date   CHOL 227* 08/24/2011   CHOL 159 02/18/2011   CHOL 208* 08/22/2010   Lab Results  Component Value Date   HDL 91.80 08/24/2011   HDL 53.10 02/18/2011   HDL 89.60 08/22/2010   Lab Results  Component Value Date   LDLCALC 94 02/18/2011   LDLCALC 98 08/20/2009   Lab Results  Component Value Date   TRIG 100.0 08/24/2011   TRIG 61.0 02/18/2011   TRIG 83.0 08/22/2010   Lab Results  Component Value Date   CHOLHDL 2 08/24/2011   CHOLHDL 3 02/18/2011   CHOLHDL 2 08/22/2010   Lab Results  Component Value Date   LDLDIRECT 110.3 08/24/2011   LDLDIRECT 114.6 08/22/2010   LDLDIRECT 131.0 02/19/2010   is overall better  Fish oil and diet alone   Patient Active Problem List  Diagnosis  . FIBROIDS, UTERUS  . HYPERCHOLESTEROLEMIA  . HYPERTENSION  . POSTMENOPAUSAL STATUS  . MURMUR  . DM type 2 (diabetes mellitus, type 2)  . CERVICAL RADICULOPATHY, LEFT  . Other screening mammogram  . Gynecological examination  . Post-menopausal bleeding  . Fullness of supraclavicular fossa    Past Medical History  Diagnosis Date  . Hypertension   . 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 Hyperlipidemia   . Heart murmur   . Fibroids   . Gallstones     incidental asymptomatic gallstones   No past surgical history on file. History  Substance Use Topics  . Smoking status: Never Smoker   . Smokeless tobacco: Not on file  . Alcohol Use: No   Family History  Problem Relation Age of Onset  . Hypertension Mother   . Diabetes Mother   . Osteoporosis Sister   . Diabetes Brother    Allergies  Allergen Reactions  . Ibuprofen     REACTION: palpitations  . Sulfamethoxazole W-Trimethoprim     REACTION: rash   Current Outpatient Prescriptions on File Prior to Visit  Medication Sig Dispense Refill  . Blood Glucose Monitoring Suppl (ACCU-CHEK AVIVA PLUS) W/DEVICE KIT Check glucose once daily and as needed for DM 250.0       . glucose blood (ACCU-CHEK AVIVA) test strip Use as directed to check glucose once daily and as needed DM 250.0       .  Lancets (ACCU-CHEK MULTICLIX) lancets Use as instructed       . lisinopril (PRINIVIL,ZESTRIL) 40 MG tablet Take 1 tablet (40 mg total) by mouth daily.  30 tablet  11  . Multiple Vitamin (MULTIVITAMIN) tablet Take 1 tablet by mouth daily.        . Omega-3 Fatty Acids (FISH OIL) 1000 MG CAPS Take 1 capsule by mouth daily.           Review of Systems Review of Systems  Constitutional: Negative for fever, appetite change, fatigue and unexpected weight change.  Eyes: Negative for pain and visual disturbance.  Respiratory: Negative for cough and shortness of breath.   Cardiovascular: Negative for cp or palpitations    Gastrointestinal: Negative for nausea, diarrhea and constipation.  Genitourinary: Negative for urgency and frequency.  Skin: Negative for pallor or rash   Neurological: Negative for weakness, light-headedness, numbness and headaches.  Hematological: Negative for adenopathy. Does not bruise/bleed easily.    Psychiatric/Behavioral: Negative for dysphoric mood. The patient is not nervous/anxious.         Objective:   Physical Exam  Constitutional: She appears well-developed and well-nourished. No distress.  HENT:  Head: Normocephalic and atraumatic.  Mouth/Throat: Oropharynx is clear and moist.  Eyes: Conjunctivae and EOM are normal. Pupils are equal, round, and reactive to light. No scleral icterus.  Neck: Normal range of motion. Neck supple. No JVD present. Carotid bruit is not present. No thyromegaly present.  Cardiovascular: Normal rate, regular rhythm, normal heart sounds and intact distal pulses.  Exam reveals no gallop.   Pulmonary/Chest: Effort normal and breath sounds normal. No respiratory distress. She has no wheezes.  Abdominal: Soft. Bowel sounds are normal. She exhibits no distension, no abdominal bruit and no mass. There is no tenderness.  Musculoskeletal: Normal range of motion. She exhibits no edema and no tenderness.  Lymphadenopathy:    She has no cervical adenopathy.  Neurological: She is alert. She has normal reflexes. No cranial nerve deficit. She exhibits normal muscle tone. Coordination normal.  Skin: Skin is warm and dry. No rash noted. No erythema. No pallor.  Psychiatric: She has a normal mood and affect.          Assessment & Plan:

## 2011-08-26 NOTE — Assessment & Plan Note (Signed)
Improved with better diet  Disc goals for lipids and reasons to control them Rev labs with pt Rev low sat fat diet in detail  

## 2011-12-15 ENCOUNTER — Telehealth: Payer: Self-pay

## 2011-12-15 NOTE — Telephone Encounter (Signed)
Pt has reenrolled in [facility] medicare and pt received sheet with health screening that should be scheduled. Pt brought paperwork from [facility] and is on Dr Royden Purl shelf. Pt already has lab scheduled 02/23/12 and CPX 03/01/12. Please advise.

## 2011-12-15 NOTE — Telephone Encounter (Signed)
Hold this for her visit in [place] [facility] box

## 2012-02-22 ENCOUNTER — Telehealth: Payer: Self-pay | Admitting: Family Medicine

## 2012-02-22 DIAGNOSIS — E119 Type 2 diabetes mellitus without complications: Secondary | ICD-10-CM

## 2012-02-22 DIAGNOSIS — I10 Essential (primary) hypertension: Secondary | ICD-10-CM

## 2012-02-22 DIAGNOSIS — E7800 Pure hypercholesterolemia, unspecified: Secondary | ICD-10-CM

## 2012-02-22 NOTE — Telephone Encounter (Signed)
Message copied by Judy Pimple on Mon Feb 22, 2012  9:15 PM ------      Message from: [name]      Created: Mon Feb 22, 2012  8:28 AM      Regarding: Cpx Labs Tues 02/23/12       Please order  future cpx labs for pt's upcoming lab appt.      Thanks      Rodney Booze

## 2012-02-23 ENCOUNTER — Other Ambulatory Visit (INDEPENDENT_AMBULATORY_CARE_PROVIDER_SITE_OTHER): Payer: Medicare Other

## 2012-02-23 DIAGNOSIS — E7800 Pure hypercholesterolemia, unspecified: Secondary | ICD-10-CM

## 2012-02-23 DIAGNOSIS — E119 Type 2 diabetes mellitus without complications: Secondary | ICD-10-CM

## 2012-02-23 DIAGNOSIS — I10 Essential (primary) hypertension: Secondary | ICD-10-CM

## 2012-02-23 LAB — CBC WITH DIFFERENTIAL/PLATELET
Basophils Absolute: 0.1 10*3/uL (ref 0.0–0.1)
Eosinophils Absolute: 0.2 10*3/uL (ref 0.0–0.7)
Hemoglobin: 13.4 g/dL (ref 12.0–15.0)
Lymphocytes Relative: 36.8 % (ref 12.0–46.0)
MCHC: 33.2 g/dL (ref 30.0–36.0)
Neutro Abs: 4.7 10*3/uL (ref 1.4–7.7)
RDW: 14.4 % (ref 11.5–14.6)

## 2012-02-23 LAB — COMPREHENSIVE METABOLIC PANEL
ALT: 19 U/L (ref 0–35)
AST: 24 U/L (ref 0–37)
Alkaline Phosphatase: 70 U/L (ref 39–117)
Calcium: 9.6 mg/dL (ref 8.4–10.5)
Chloride: 105 mEq/L (ref 96–112)
Creatinine, Ser: 1 mg/dL (ref 0.4–1.2)
Total Bilirubin: 0.6 mg/dL (ref 0.3–1.2)

## 2012-02-23 LAB — LIPID PANEL
HDL: 78.7 mg/dL (ref >39.00)
Total CHOL/HDL Ratio: 3
VLDL: 20 mg/dL (ref 0.0–40.0)

## 2012-02-23 LAB — TSH: TSH: 4.74 u[IU]/mL (ref 0.35–5.50)

## 2012-02-23 LAB — HEMOGLOBIN A1C: Hgb A1c MFr Bld: 6.6 % — ABNORMAL HIGH (ref 4.6–6.5)

## 2012-03-01 ENCOUNTER — Ambulatory Visit (INDEPENDENT_AMBULATORY_CARE_PROVIDER_SITE_OTHER): Payer: Medicare Other | Admitting: Family Medicine

## 2012-03-01 ENCOUNTER — Other Ambulatory Visit: Payer: Medicare Other

## 2012-03-01 ENCOUNTER — Encounter: Payer: Self-pay | Admitting: Family Medicine

## 2012-03-01 VITALS — BP 140/80 | HR 78 | Temp 97.7°F | Ht 60.75 in | Wt 150.5 lb

## 2012-03-01 DIAGNOSIS — I10 Essential (primary) hypertension: Secondary | ICD-10-CM

## 2012-03-01 DIAGNOSIS — E119 Type 2 diabetes mellitus without complications: Secondary | ICD-10-CM

## 2012-03-01 DIAGNOSIS — E7800 Pure hypercholesterolemia, unspecified: Secondary | ICD-10-CM

## 2012-03-01 DIAGNOSIS — Z1231 Encounter for screening mammogram for malignant neoplasm of breast: Secondary | ICD-10-CM

## 2012-03-01 MED ORDER — LISINOPRIL 40 MG PO TABS: 40.0000 mg | ORAL_TABLET | Freq: Every day | ORAL | Status: DC

## 2012-03-01 NOTE — Assessment & Plan Note (Signed)
Overall good control with diet Rev low glycemic diet Nl foot exam utd opthy On ace  Rev labs

## 2012-03-01 NOTE — Assessment & Plan Note (Signed)
bp better on 2nd check and pt will begin to check at home with her new cuff  Rev labs F/u 6 mo

## 2012-03-01 NOTE — Assessment & Plan Note (Signed)
Chol is up due to fried foods Disc goals for lipids and reasons to control them Rev labs with pt Rev low sat fat diet in detail  Pt will work on this  Re check 6 mo

## 2012-03-01 NOTE — Progress Notes (Signed)
Subjective:    Patient ID: [name], female    DOB: 29-May-1940, 72 y.o.   MRN: [id number]  HPI Here for check up of chronic medical conditions and to review health mt list   bp is up a bit  today  No cp or palpitations or headaches or edema  No side effects to medicines  BP Readings from Last 3 Encounters:  03/01/12 158/94  08/26/11 130/78  03/02/11 140/80  not high at home  Feeling ok      Wt is up 5 lb with bmi of 28 Is from the holidays  dexa nl 03 Falls-none at all   Mood - is good /no depression   Zoster status - has not had vaccine   Flu vaccine - had her flu shot in sept at pharmacy  Pap 1/13 No bleeding at all or other gyn problems    mammo 2/13-at [facility] , will make her own appt when she gets her card in the mail  Self exam - nl lumps or changes   colonosc 5/11  opthy- was last year - 3/13 was ok  No vision probs   DM Lab Results  Component Value Date   HGBA1C 6.6* 02/23/2012    Lipids Lab Results  Component Value Date   CHOL 230* 02/23/2012   CHOL 227* 08/24/2011   CHOL 159 02/18/2011   Lab Results  Component Value Date   HDL 78.70 02/23/2012   HDL 91.80 08/24/2011   HDL 53.10 02/18/2011   Lab Results  Component Value Date   LDLCALC 94 02/18/2011   LDLCALC 98 08/20/2009   Lab Results  Component Value Date   TRIG 100.0 02/23/2012   TRIG 100.0 08/24/2011   TRIG 61.0 02/18/2011   Lab Results  Component Value Date   CHOLHDL 3 02/23/2012   CHOLHDL 2 08/24/2011   CHOLHDL 3 02/18/2011   Lab Results  Component Value Date   LDLDIRECT 122.4 02/23/2012   LDLDIRECT 110.3 08/24/2011   LDLDIRECT 114.6 08/22/2010   she had not watched her diet over holidays - ate too much fried food  Patient Active Problem List  Diagnosis  . FIBROIDS, UTERUS  . HYPERCHOLESTEROLEMIA  . HYPERTENSION  . POSTMENOPAUSAL STATUS  . MURMUR  . DM type 2 (diabetes mellitus, type 2)  . CERVICAL RADICULOPATHY, LEFT  . Other screening mammogram  . Gynecological examination  .  Post-menopausal bleeding  . Fullness of supraclavicular fossa   Past Medical History  Diagnosis Date  . Hypertension   . 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 Hyperlipidemia   . Heart murmur   . Fibroids   . Gallstones     incidental asymptomatic gallstones   No past surgical history on file. History  Substance Use Topics  . Smoking status: Never Smoker   . Smokeless tobacco: Not on file  . Alcohol Use: No   Family History  Problem Relation Age of Onset  . Hypertension Mother   . Diabetes Mother   . Osteoporosis Sister   . Diabetes Brother    Allergies  Allergen Reactions  . Ibuprofen     REACTION: palpitations  . Sulfamethoxazole W-Trimethoprim     REACTION: rash   Current Outpatient Prescriptions on File Prior to Visit  Medication Sig Dispense Refill  . Blood Glucose Monitoring Suppl (ACCU-CHEK AVIVA PLUS) W/DEVICE KIT Check glucose once daily and as needed for DM 250.0       . glucose blood (ACCU-CHEK AVIVA) test strip Use  as directed to check glucose once daily and as needed DM 250.0       . Lancets (ACCU-CHEK MULTICLIX) lancets Use as instructed       . lisinopril (PRINIVIL,ZESTRIL) 40 MG tablet Take 1 tablet (40 mg total) by mouth daily.  30 tablet  11  . Multiple Vitamin (MULTIVITAMIN) tablet Take 1 tablet by mouth daily.        . Omega-3 Fatty Acids (FISH OIL) 1000 MG CAPS Take 1 capsule by mouth daily.           Review of Systems Review of Systems  Constitutional: Negative for fever, appetite change, fatigue and unexpected weight change.  Eyes: Negative for pain and visual disturbance.  Respiratory: Negative for cough and shortness of breath.   Cardiovascular: Negative for cp or palpitations    Gastrointestinal: Negative for nausea, diarrhea and constipation.  Genitourinary: Negative for urgency and frequency.  Skin: Negative for pallor or rash   Neurological: Negative for weakness, light-headedness, numbness and headaches.  Hematological: Negative for  adenopathy. Does not bruise/bleed easily.  Psychiatric/Behavioral: Negative for dysphoric mood. The patient is not nervous/anxious.         Objective:   Physical Exam  Constitutional: She appears well-developed and well-nourished. No distress.  HENT:  Head: Normocephalic and atraumatic.  Right Ear: External ear normal.  Left Ear: External ear normal.  Nose: Nose normal.  Mouth/Throat: Oropharynx is clear and moist. No oropharyngeal exudate.  Eyes: Conjunctivae normal and EOM are normal. Pupils are equal, round, and reactive to light. Right eye exhibits no discharge. Left eye exhibits no discharge. No scleral icterus.  Neck: Normal range of motion. Neck supple. No JVD present. Carotid bruit is not present. No thyromegaly present.  Cardiovascular: Normal rate, regular rhythm, normal heart sounds and intact distal pulses.  Exam reveals no gallop.   Pulmonary/Chest: Effort normal and breath sounds normal. No respiratory distress. She has no wheezes.  Abdominal: Soft. Normal appearance and bowel sounds are normal. She exhibits no distension, no abdominal bruit and no mass. There is no tenderness.  Genitourinary: No breast swelling, tenderness, discharge or bleeding.       Breast exam: No mass, nodules, thickening, tenderness, bulging, retraction, inflamation, nipple discharge or skin changes noted.  No axillary or clavicular LA.  Chaperoned exam.    Musculoskeletal: She exhibits no edema and no tenderness.  Lymphadenopathy:    She has no cervical adenopathy.  Neurological: She is alert. She has normal reflexes. No cranial nerve deficit. She exhibits normal muscle tone. Coordination normal.  Skin: Skin is warm and dry. No rash noted. No erythema. No pallor.  Psychiatric: She has a normal mood and affect.          Assessment & Plan:

## 2012-03-01 NOTE — Assessment & Plan Note (Signed)
Pt will schedule own mam at [facility] in spring Nl breast exam today

## 2012-03-01 NOTE — Patient Instructions (Addendum)
If you are interested in a shingles/zoster vaccine - call your insurance to check on coverage,( you should not get it within 1 month of other vaccines) , then call us for a prescription  for it to take to a pharmacy that gives the shot , or make a nurse visit to get it here depending on your coverage Do not forget to make your annual mammogram appointment for feb or later  Try to get 1200-1500 mg of calcium per day with at least 1000 iu of vitamin D - for bone health   check blood pressure regularly at home - if over 140/90 follow up -bring cuff to next visit Cholesterol is up a bit  Avoid red meat/ fried foods/ egg yolks/ fatty breakfast meats/ butter, cheese and high fat dairy/ and shellfish   Follow up in 6 months with labs prior

## 2012-04-14 ENCOUNTER — Encounter: Payer: Self-pay | Admitting: Family Medicine

## 2012-04-25 ENCOUNTER — Encounter: Payer: Self-pay | Admitting: Family Medicine

## 2012-04-26 ENCOUNTER — Encounter: Payer: Self-pay | Admitting: *Deleted

## 2012-08-24 ENCOUNTER — Other Ambulatory Visit (INDEPENDENT_AMBULATORY_CARE_PROVIDER_SITE_OTHER): Payer: Medicare Other

## 2012-08-24 DIAGNOSIS — E7800 Pure hypercholesterolemia, unspecified: Secondary | ICD-10-CM

## 2012-08-24 DIAGNOSIS — E119 Type 2 diabetes mellitus without complications: Secondary | ICD-10-CM

## 2012-08-24 DIAGNOSIS — I10 Essential (primary) hypertension: Secondary | ICD-10-CM

## 2012-08-24 LAB — LIPID PANEL
Cholesterol: 194 mg/dL (ref 0–200)
HDL: 72.8 mg/dL (ref >39.00)
VLDL: 30.8 mg/dL (ref 0.0–40.0)

## 2012-08-24 LAB — HEMOGLOBIN A1C: Hgb A1c MFr Bld: 6.7 % — ABNORMAL HIGH (ref 4.6–6.5)

## 2012-08-24 LAB — COMPREHENSIVE METABOLIC PANEL
Alkaline Phosphatase: 69 U/L (ref 39–117)
BUN: 14 mg/dL (ref 6–23)
Creatinine, Ser: 1.1 mg/dL (ref 0.4–1.2)
GFR: 65.84 mL/min (ref >60.00)
Glucose, Bld: 112 mg/dL — ABNORMAL HIGH (ref 70–99)
Total Bilirubin: 0.5 mg/dL (ref 0.3–1.2)

## 2012-08-31 ENCOUNTER — Ambulatory Visit (INDEPENDENT_AMBULATORY_CARE_PROVIDER_SITE_OTHER): Payer: Medicare Other | Admitting: Family Medicine

## 2012-08-31 ENCOUNTER — Encounter: Payer: Self-pay | Admitting: Family Medicine

## 2012-08-31 VITALS — BP 134/78 | HR 84 | Temp 98.6°F | Ht 60.75 in | Wt 147.5 lb

## 2012-08-31 DIAGNOSIS — E7800 Pure hypercholesterolemia, unspecified: Secondary | ICD-10-CM

## 2012-08-31 DIAGNOSIS — E119 Type 2 diabetes mellitus without complications: Secondary | ICD-10-CM

## 2012-08-31 DIAGNOSIS — I10 Essential (primary) hypertension: Secondary | ICD-10-CM

## 2012-08-31 NOTE — Progress Notes (Signed)
Subjective:    Patient ID: [name], female    DOB: 1940-03-09, 72 y.o.   MRN: [id number]  HPI Here for f/u of DM and other chronic conditions   Wt is down 3 lb with bmi of 28  Has been doing ok in general   Diabetes Home sugar results are generally good - in the low 100s  DM diet - is eating regular meals and watching sugar in diet -- lost 3 lb  Exercise -walking regularly Symptoms-none except for occ finger tingling- ? From DM or from the blood sugar checks  A1C last  Lab Results  Component Value Date   HGBA1C 6.7* 08/24/2012  overall is stable   No problems with medications  Renal protection on ace  Last eye exam 05/25/12- was ok   bp is stable today  No cp or palpitations or headaches or edema  No side effects to medicines  BP Readings from Last 3 Encounters:  08/31/12 134/78  03/01/12 140/80  08/26/11 130/78      Hyperlipidemia  Diet controlled  Lab Results  Component Value Date   CHOL 194 08/24/2012   HDL 72.80 08/24/2012   LDLCALC 90 08/24/2012   LDLDIRECT 122.4 02/23/2012   TRIG 154.0* 08/24/2012   CHOLHDL 3 08/24/2012   does not eat a lot of fatty foods    Patient Active Problem List   Diagnosis Date Noted  . Fullness of supraclavicular fossa 02/27/2011  . Other screening mammogram 02/25/2011  . Gynecological examination 02/25/2011  . Post-menopausal bleeding 02/25/2011  . CERVICAL RADICULOPATHY, LEFT 03/31/2010  . DM type 2 (diabetes mellitus, type 2) 07/06/2007  . FIBROIDS, UTERUS 03/23/2007  . HYPERCHOLESTEROLEMIA 03/23/2007  . HYPERTENSION 03/23/2007  . POSTMENOPAUSAL STATUS 03/23/2007  . MURMUR 03/23/2007   Past Medical History  Diagnosis Date  . Hypertension   . 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 Hyperlipidemia   . Heart murmur   . Fibroids   . Gallstones     incidental asymptomatic gallstones   No past surgical history on file. History  Substance Use Topics  . Smoking status: Never Smoker   . Smokeless tobacco: Not on file  .  Alcohol Use: No   Family History  Problem Relation Age of Onset  . Hypertension Mother   . Diabetes Mother   . Osteoporosis Sister   . Diabetes Brother    Allergies  Allergen Reactions  . Ibuprofen     REACTION: palpitations  . Sulfamethoxazole W-Trimethoprim     REACTION: rash   Current Outpatient Prescriptions on File Prior to Visit  Medication Sig Dispense Refill  . Blood Glucose Monitoring Suppl (ACCU-CHEK AVIVA PLUS) W/DEVICE KIT Check glucose once daily and as needed for DM 250.0       . glucose blood (ACCU-CHEK AVIVA) test strip Use as directed to check glucose once daily and as needed DM 250.0       . Lancets (ACCU-CHEK MULTICLIX) lancets Use as instructed       . lisinopril (PRINIVIL,ZESTRIL) 40 MG tablet Take 1 tablet (40 mg total) by mouth daily.  30 tablet  11  . Multiple Vitamin (MULTIVITAMIN) tablet Take 1 tablet by mouth daily.        . Omega-3 Fatty Acids (FISH OIL) 1000 MG CAPS Take 1 capsule by mouth daily.         No current facility-administered medications on file prior to visit.     Review of Systems    Review of  Systems  Constitutional: Negative for fever, appetite change, fatigue and unexpected weight change.  Eyes: Negative for pain and visual disturbance.  Respiratory: Negative for cough and shortness of breath.   Cardiovascular: Negative for cp or palpitations    Gastrointestinal: Negative for nausea, diarrhea and constipation.  Genitourinary: Negative for urgency and frequency.  Skin: Negative for pallor or rash   Neurological: Negative for weakness, light-headedness, numbness and headaches. pos for occ tingling in fingers both hands  Hematological: Negative for adenopathy. Does not bruise/bleed easily.  Psychiatric/Behavioral: Negative for dysphoric mood. The patient is not nervous/anxious.      Objective:   Physical Exam  Constitutional: She appears well-developed and well-nourished. No distress.  HENT:  Head: Normocephalic and atraumatic.   Mouth/Throat: Oropharynx is clear and moist.  Eyes: Conjunctivae and EOM are normal. Pupils are equal, round, and reactive to light. Right eye exhibits no discharge. Left eye exhibits no discharge. No scleral icterus.  Neck: Normal range of motion. Neck supple. No JVD present. Carotid bruit is not present. No thyromegaly present.  Cardiovascular: Normal rate, regular rhythm, normal heart sounds and intact distal pulses.  Exam reveals no gallop.   Pulmonary/Chest: Effort normal and breath sounds normal. No respiratory distress. She has no wheezes.  Abdominal: Soft. Bowel sounds are normal. She exhibits no distension, no abdominal bruit and no mass. There is no tenderness.  Musculoskeletal: She exhibits no edema and no tenderness.  Lymphadenopathy:    She has no cervical adenopathy.  Neurological: She is alert. She has normal reflexes. No cranial nerve deficit. She exhibits normal muscle tone. Coordination normal.  Skin: Skin is warm and dry. No rash noted. No erythema. No pallor.  Psychiatric: She has a normal mood and affect.          Assessment & Plan:

## 2012-08-31 NOTE — Patient Instructions (Addendum)
If you are interested in a shingles/zoster vaccine - call your insurance to check on coverage,( you should not get it within 1 month of other vaccines) , then call us for a prescription  for it to take to a pharmacy that gives the shot , or make a nurse visit to get it here depending on your coverage Take care of yourself  Watch sugar and fats in your diet  Follow up in 6 months for annual exam with labs prior

## 2012-09-01 NOTE — Assessment & Plan Note (Signed)
Stable and well controlled on current regimen Rev DM checklist  Will look into affordability of zostavax F/u 6 mo

## 2012-09-01 NOTE — Assessment & Plan Note (Signed)
Disc goals for lipids and reasons to control them Rev labs with pt Diet controlled  Rev low sat fat diet in detail

## 2012-09-01 NOTE — Assessment & Plan Note (Signed)
bp in fair control at this time  No changes needed  Disc lifstyle change with low sodium diet and exercise  Rev diet

## 2012-09-21 ENCOUNTER — Other Ambulatory Visit: Payer: Self-pay

## 2012-10-07 IMAGING — CT CT ANGIO NECK
1 of 8 series · 4 of 46 positions shown, 9 images · IV contrast (Omnipaque 300)
Comparison: None

CLINICAL DATA: New onset left neck swelling today.

CT ANGIOGRAPHY NECK
TECHNIQUE: Multidetector CT imaging of the neck was performed
using the standard protocol during bolus administration of
intravenous contrast.  Multiplanar CT image reconstructions
including MIPs were obtained to evaluate the vascular anatomy.
Carotid stenosis measurements (when applicable) are obtained
utilizing NASCET criteria, using the distal internal carotid
diameter as the denominator.
Contrast: 100mL OMNIPAQUE IOHEXOL 350 MG/ML IV SOLN

[Series 7: (person_name) 3.0 b30f st · axial · 0.41mm/px · z∈[-177,-57]mm · 4 of 68 slices shown, 9 images]
[im 14/68  soft-tissue]
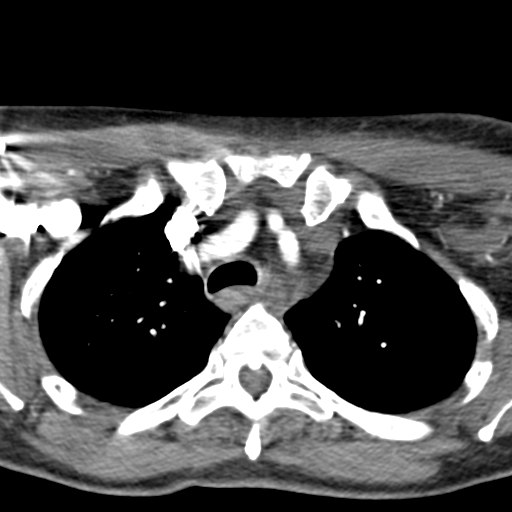
[im 14/68  lung]
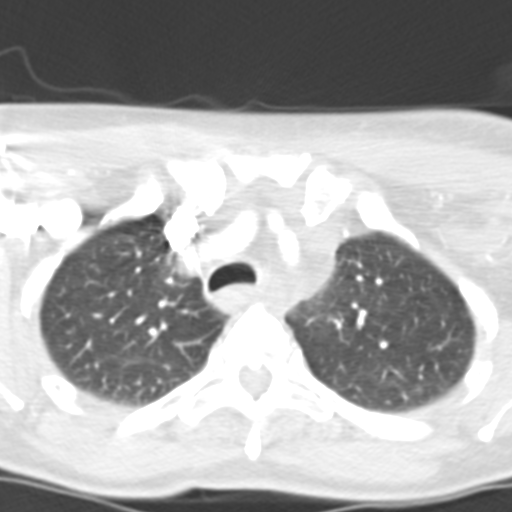
[im 14/68  bone]
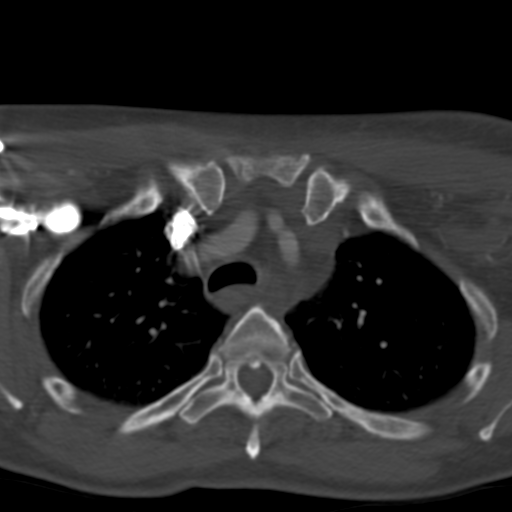
[im 27/68  soft-tissue]
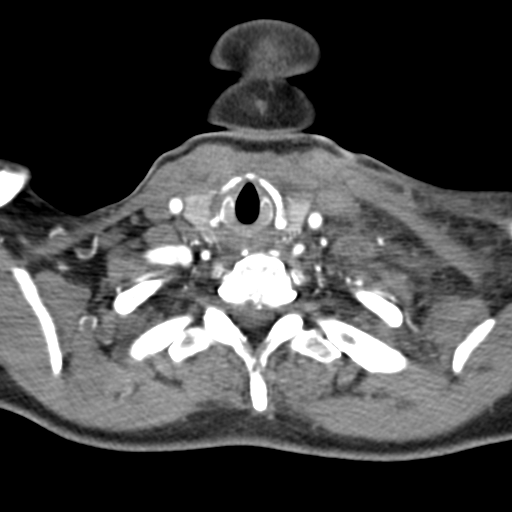
[im 27/68  lung]
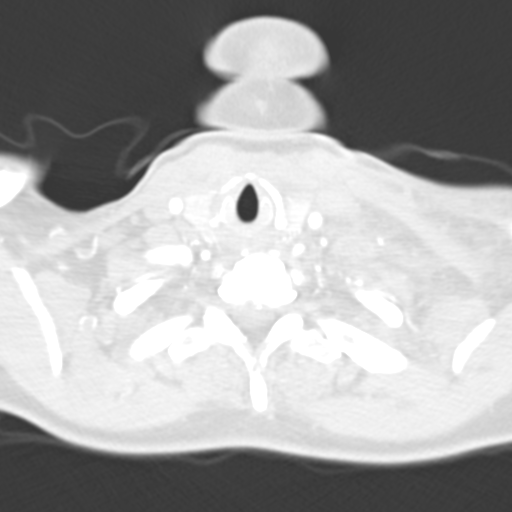
[im 41/68  soft-tissue]
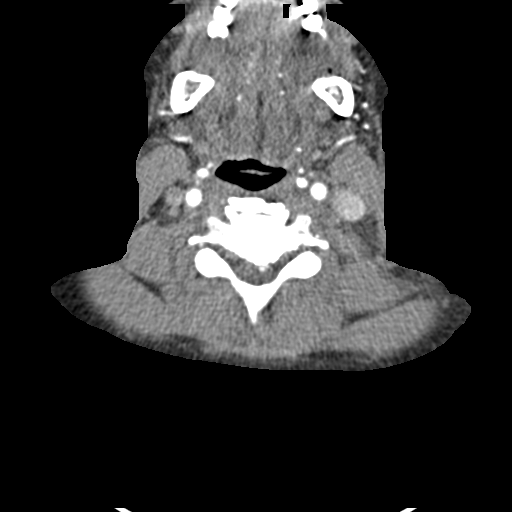
[im 41/68  lung]
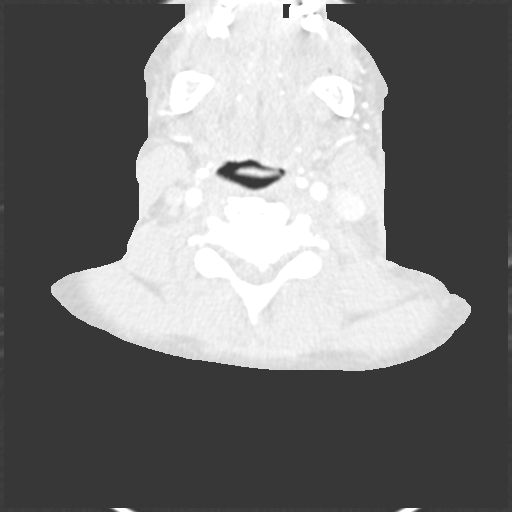
[im 54/68  soft-tissue]
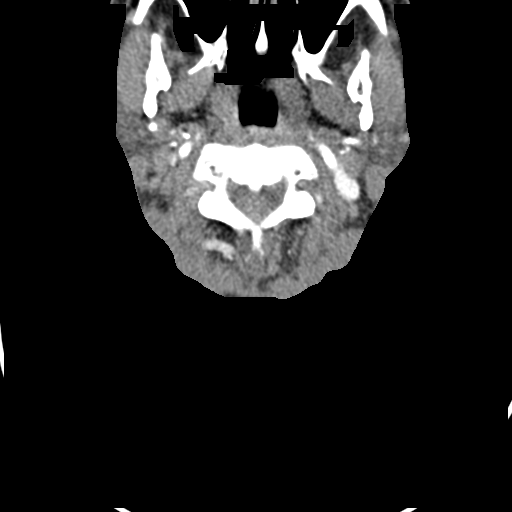
[im 54/68  lung]
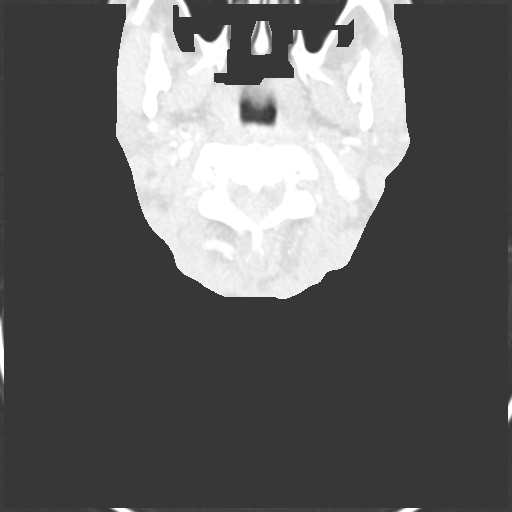

[4 of 46 positions shown; findings below may reference images not displayed]

FINDINGS: There is diffuse edema in the left supraclavicular fossa
in the subcutaneous and deep fat.  There is mild enlargement of the
left subclavian vein suggestive of venous thrombosis in the vein.
Unfortunately,   the study was tailored to evaluate the arteries
and there is no contrast in the veins to confirm this finding.
Additional imaging may be necessary to confirm.

Lung apices are clear.  No mass or adenopathy is present.  There is
extensive ossification of the posterior longitudinal ligament with
associated spondylosis especially at C4-5 and C5-6.  This is
causing significant spinal stenosis.  I suspect there may be some
cord compression at C4-5 and C5-6 due to spondylosis.  There is
also ossification of  the posterior longitudinal ligament in the
upper cervical spine.  Neurologic evaluation is suggested for
myelopathy.

Mild atherosclerotic disease in the right carotid bulb without
significant stenosis.  Minimal calcified plaque in the left carotid
bulb without significant stenosis.  Both vertebral arteries are
patent.  Negative for vertebral or carotid aneurysm.

 Review of the MIP images confirms the above findings.
IMPRESSION: Subcutaneous and deep tissue edema in the left clavicular fossa and
left neck without underlying mass.  Left subclavian vein is mildly
enlarged.  Findings are suspicious for acute deep venous thrombosis
of the left subclavian vein.  Unfortunately, the study was
performed to  evaluate the arterial system and there is no contrast
in the veins.  I would suggest follow-up Doppler ultrasound
examination of the left arm to   confirm   DVT.

No significant carotid stenosis or aneurysm is identified.

## 2012-12-22 ENCOUNTER — Other Ambulatory Visit: Payer: Self-pay

## 2013-02-27 ENCOUNTER — Telehealth: Payer: Self-pay | Admitting: Family Medicine

## 2013-02-27 DIAGNOSIS — E119 Type 2 diabetes mellitus without complications: Secondary | ICD-10-CM

## 2013-02-27 DIAGNOSIS — I10 Essential (primary) hypertension: Secondary | ICD-10-CM

## 2013-02-27 DIAGNOSIS — E7800 Pure hypercholesterolemia, unspecified: Secondary | ICD-10-CM

## 2013-02-27 NOTE — Telephone Encounter (Signed)
Message copied by Abner Greenspan on Mon Feb 27, 2013  7:30 PM ------      [name]      Created: Fri Feb 17, 2013  9:56 AM      Regarding: Lab orders for Tuesday, 1.13.15       Patient is scheduled for CPX labs, please order future labs, Thanks , [name]       ------

## 2013-02-28 ENCOUNTER — Other Ambulatory Visit (INDEPENDENT_AMBULATORY_CARE_PROVIDER_SITE_OTHER): Payer: Medicare Other

## 2013-02-28 DIAGNOSIS — E119 Type 2 diabetes mellitus without complications: Secondary | ICD-10-CM

## 2013-02-28 DIAGNOSIS — E7800 Pure hypercholesterolemia, unspecified: Secondary | ICD-10-CM

## 2013-02-28 DIAGNOSIS — N951 Menopausal and female climacteric states: Secondary | ICD-10-CM

## 2013-02-28 DIAGNOSIS — I10 Essential (primary) hypertension: Secondary | ICD-10-CM

## 2013-02-28 LAB — COMPREHENSIVE METABOLIC PANEL
ALBUMIN: 3.8 g/dL (ref 3.5–5.2)
ALT: 17 U/L (ref 0–35)
AST: 20 U/L (ref 0–37)
Alkaline Phosphatase: 63 U/L (ref 39–117)
BILIRUBIN TOTAL: 0.6 mg/dL (ref 0.3–1.2)
BUN: 15 mg/dL (ref 6–23)
CO2: 28 mEq/L (ref 19–32)
Calcium: 9.4 mg/dL (ref 8.4–10.5)
Chloride: 107 mEq/L (ref 96–112)
Creatinine, Ser: 1 mg/dL (ref 0.4–1.2)
GFR: 72.83 mL/min (ref >60.00)
GLUCOSE: 103 mg/dL — AB (ref 70–99)
Potassium: 3.8 mEq/L (ref 3.5–5.1)
Sodium: 141 mEq/L (ref 135–145)
Total Protein: 7.5 g/dL (ref 6.0–8.3)

## 2013-02-28 LAB — CBC WITH DIFFERENTIAL/PLATELET
BASOS PCT: 0.4 % (ref 0.0–3.0)
Basophils Absolute: 0 10*3/uL (ref 0.0–0.1)
EOS PCT: 2 % (ref 0.0–5.0)
Eosinophils Absolute: 0.2 10*3/uL (ref 0.0–0.7)
HEMATOCRIT: 38.2 % (ref 36.0–46.0)
Hemoglobin: 12.7 g/dL (ref 12.0–15.0)
LYMPHS ABS: 3.2 10*3/uL (ref 0.7–4.0)
Lymphocytes Relative: 39.4 % (ref 12.0–46.0)
MCHC: 33.2 g/dL (ref 30.0–36.0)
MCV: 81.8 fl (ref 78.0–100.0)
Monocytes Absolute: 0.5 10*3/uL (ref 0.1–1.0)
Monocytes Relative: 6.7 % (ref 3.0–12.0)
Neutro Abs: 4.1 10*3/uL (ref 1.4–7.7)
Neutrophils Relative %: 51.5 % (ref 43.0–77.0)
Platelets: 284 10*3/uL (ref 150.0–400.0)
RBC: 4.66 Mil/uL (ref 3.87–5.11)
RDW: 15 % — ABNORMAL HIGH (ref 11.5–14.6)
WBC: 8 10*3/uL (ref 4.5–10.5)

## 2013-02-28 LAB — LDL CHOLESTEROL, DIRECT: Direct LDL: 111.2 mg/dL

## 2013-02-28 LAB — TSH: TSH: 1.46 u[IU]/mL (ref 0.35–5.50)

## 2013-02-28 LAB — LIPID PANEL
CHOLESTEROL: 202 mg/dL — AB (ref 0–200)
HDL: 73.8 mg/dL (ref >39.00)
Total CHOL/HDL Ratio: 3
Triglycerides: 78 mg/dL (ref 0.0–149.0)
VLDL: 15.6 mg/dL (ref 0.0–40.0)

## 2013-02-28 LAB — HEMOGLOBIN A1C: HEMOGLOBIN A1C: 6.7 % — AB (ref 4.6–6.5)

## 2013-03-07 ENCOUNTER — Ambulatory Visit (INDEPENDENT_AMBULATORY_CARE_PROVIDER_SITE_OTHER): Payer: Medicare Other | Admitting: Family Medicine

## 2013-03-07 ENCOUNTER — Encounter: Payer: Self-pay | Admitting: Family Medicine

## 2013-03-07 VITALS — BP 150/90 | HR 73 | Temp 97.7°F | Ht 61.0 in | Wt 144.0 lb

## 2013-03-07 DIAGNOSIS — I10 Essential (primary) hypertension: Secondary | ICD-10-CM

## 2013-03-07 DIAGNOSIS — Z0000 Encounter for general adult medical examination without abnormal findings: Secondary | ICD-10-CM

## 2013-03-07 DIAGNOSIS — E119 Type 2 diabetes mellitus without complications: Secondary | ICD-10-CM

## 2013-03-07 DIAGNOSIS — E7800 Pure hypercholesterolemia, unspecified: Secondary | ICD-10-CM

## 2013-03-07 MED ORDER — GLUCOSE BLOOD VI STRP: ORAL_STRIP | Status: DC

## 2013-03-07 MED ORDER — HYDROCHLOROTHIAZIDE 25 MG PO TABS: 25.0000 mg | ORAL_TABLET | Freq: Every day | ORAL | Status: DC

## 2013-03-07 MED ORDER — LISINOPRIL 40 MG PO TABS: 40.0000 mg | ORAL_TABLET | Freq: Every day | ORAL | Status: DC

## 2013-03-07 MED ORDER — ACCU-CHEK MULTICLIX LANCETS MISC: Status: DC

## 2013-03-07 NOTE — Patient Instructions (Addendum)
If you are interested in a shingles/zoster vaccine - call your insurance to check on coverage,( you should not get it within 1 month of other vaccines) , then call us for a prescription  for it to take to a pharmacy that gives the shot , or make a nurse visit to get it here depending on your coverage  you will be due for a tetanus shot in the spring - since medicare does not cover that - get it at the health dept> (it will be cheaper) Try to get 1200-1500 mg of calcium per day with at least 1000 iu of vitamin D - for bone health  Continue lisinopril for blood pressure Add hctz 1 pill daily in the am for blood pressure as well (since it is too high) Follow up with me in 1-2 months for blood pressure visit and labs  If any side effects or problems please let me know       DASH Diet The DASH diet stands for "Dietary Approaches to Stop Hypertension." It is a healthy eating plan that has been shown to reduce high blood pressure (hypertension) in as little as 14 days, while also possibly providing other significant health benefits. These other health benefits include reducing the risk of breast cancer after menopause and reducing the risk of type 2 diabetes, heart disease, colon cancer, and stroke. Health benefits also include weight loss and slowing kidney failure in patients with chronic kidney disease.  DIET GUIDELINES  Limit salt (sodium). Your diet should contain less than 1500 mg of sodium daily.  Limit refined or processed carbohydrates. Your diet should include mostly whole grains. Desserts and added sugars should be used sparingly.  Include small amounts of heart-healthy fats. These types of fats include nuts, oils, and tub margarine. Limit saturated and trans fats. These fats have been shown to be harmful in the body. CHOOSING FOODS  The following food groups are based on a 2000 calorie diet. See your Registered Dietitian for individual calorie needs. Grains and Grain Products (6 to 8  servings daily)  Eat More Often: Whole-wheat bread, brown rice, whole-grain or wheat pasta, quinoa, popcorn without added fat or salt (air popped).  Eat Less Often: White bread, white pasta, white rice, cornbread. Vegetables (4 to 5 servings daily)  Eat More Often: Fresh, frozen, and canned vegetables. Vegetables may be raw, steamed, roasted, or grilled with a minimal amount of fat.  Eat Less Often/Avoid: Creamed or fried vegetables. Vegetables in a cheese sauce. Fruit (4 to 5 servings daily)  Eat More Often: All fresh, canned (in natural juice), or frozen fruits. Dried fruits without added sugar. One hundred percent fruit juice ( cup [237 mL] daily).  Eat Less Often: Dried fruits with added sugar. Canned fruit in light or heavy syrup. [employer], Fish, and Poultry (2 servings or less daily. One serving is 3 to 4 oz [85-114 g]).  Eat More Often: Ninety percent or leaner ground beef, tenderloin, sirloin. Round cuts of beef, chicken breast, [place] breast. All fish. Grill, bake, or broil your meat. Nothing should be fried.  Eat Less Often/Avoid: Fatty cuts of meat, [place], or chicken leg, thigh, or wing. Fried cuts of meat or fish. Dairy (2 to 3 servings)  Eat More Often: Low-fat or fat-free milk, low-fat plain or light yogurt, reduced-fat or part-skim cheese.  Eat Less Often/Avoid: Milk (whole, 2%).Whole milk yogurt. Full-fat cheeses. Nuts, Seeds, and Legumes (4 to 5 servings per week)  Eat More Often: All without added  salt.  Eat Less Often/Avoid: Salted nuts and seeds, canned beans with added salt. Fats and Sweets (limited)  Eat More Often: Vegetable oils, tub margarines without trans fats, sugar-free gelatin. Mayonnaise and salad dressings.  Eat Less Often/Avoid: Coconut oils, palm oils, butter, stick margarine, cream, half and half, cookies, candy, pie. FOR MORE INFORMATION The Dash Diet Eating Plan: www.dashdiet.org Document Released: 01/22/2011 Document Revised: 04/27/2011  Document Reviewed: 01/22/2011 [facility] Patient Information 2014 [place], [place].

## 2013-03-07 NOTE — Progress Notes (Signed)
Pre-visit discussion using our clinic review tool. No additional management support is needed unless otherwise documented below in the visit note.  

## 2013-03-07 NOTE — Progress Notes (Signed)
Subjective:    Patient ID: [name], female    DOB: [birth year], 73 y.o.   MRN: [id number]  HPI I have personally reviewed the Medicare Annual Wellness questionnaire and have noted 1. The patient's medical and social history 2. Their use of alcohol, tobacco or illicit drugs 3. Their current medications and supplements 4. The patient's functional ability including ADL's, fall risks, home safety risks and hearing or visual             impairment. 5. Diet and physical activities 6. Evidence for depression or mood disorders  The patients weight, height, BMI have been recorded in the chart and visual acuity is per eye clinic.  I have made referrals, counseling and provided education to the patient based review of the above and I have provided the pt with a written personalized care plan for preventive services.  Feels ok  Eats a healthy diet - avoids salt  Walks at least 3 times per week   See scanned forms.  Routine anticipatory guidance given to patient.  See health maintenance. Flu 9/14 at [facility]  Shingles vaccine - has not had - she has not called ins this season yet - did not cover it enough before  PNA 6/10 vaccine Tetanus 4/05 vaccine  Colonoscopy 5/11 - recall  Breast cancer screening - mammogram 3/14 - she had re check that was ok - she goes to [facility] Self exam -no lumps  Advance directive- does not have - given packet to work on that  Cognitive function addressed- see scanned forms- and if abnormal then additional documentation follows. - no major concerns   PMH and SH reviewed  Meds, vitals, and allergies reviewed.   ROS: See HPI.  Otherwise negative.    bp is up today - she took med/ was rushing this am  No cp or palpitations or headaches or edema  No side effects to medicines  BP Readings from Last 3 Encounters:  03/07/13 150/90  08/31/12 134/78  03/01/12 140/80       Chemistry      Component Value Date/Time   NA 141 02/28/2013 0808   K 3.8 02/28/2013  0808   CL 107 02/28/2013 0808   CO2 28 02/28/2013 0808   BUN 15 02/28/2013 0808   CREATININE 1.0 02/28/2013 0808      Component Value Date/Time   CALCIUM 9.4 02/28/2013 0808   ALKPHOS 63 02/28/2013 0808   AST 20 02/28/2013 0808   ALT 17 02/28/2013 0808   BILITOT 0.6 02/28/2013 0808      Hyperlipidemia Diet controlled Lab Results  Component Value Date   CHOL 202* 02/28/2013   CHOL 194 08/24/2012   CHOL 230* 02/23/2012   Lab Results  Component Value Date   HDL 73.80 02/28/2013   HDL 72.80 08/24/2012   HDL 78.70 02/23/2012   Lab Results  Component Value Date   LDLCALC 90 08/24/2012   LDLCALC 94 02/18/2011   LDLCALC 98 08/20/2009   Lab Results  Component Value Date   TRIG 78.0 02/28/2013   TRIG 154.0* 08/24/2012   TRIG 100.0 02/23/2012   Lab Results  Component Value Date   CHOLHDL 3 02/28/2013   CHOLHDL 3 08/24/2012   CHOLHDL 3 02/23/2012   Lab Results  Component Value Date   LDLDIRECT 111.2 02/28/2013   LDLDIRECT 122.4 02/23/2012   LDLDIRECT 110.3 08/24/2011   overall a bit improved- she does watch her diet    dexa 9/03 Ok No fractures She takes her  mvi - not ca and D - but will start   Diabetes Home sugar results  DM diet - she does stay away from sweets and sugar  Exercise -walks regularly  Symptoms- none  A1C last  Lab Results  Component Value Date   HGBA1C 6.7* 02/28/2013  stable from last time  Diet controlled  Renal protection on ace  Last eye exam 4/14   Lab Results  Component Value Date   WBC 8.0 02/28/2013   HGB 12.7 02/28/2013   HCT 38.2 02/28/2013   MCV 81.8 02/28/2013   PLT 284.0 02/28/2013    Lab Results  Component Value Date   TSH 1.46 02/28/2013      Patient Active Problem List   Diagnosis Date Noted  . Encounter for Medicare annual wellness exam 03/07/2013  . Fullness of supraclavicular fossa 02/27/2011  . Other screening mammogram 02/25/2011  . Gynecological examination 02/25/2011  . Post-menopausal bleeding 02/25/2011  . CERVICAL RADICULOPATHY, LEFT  03/31/2010  . DM type 2 (diabetes mellitus, type 2) 07/06/2007  . FIBROIDS, UTERUS 03/23/2007  . HYPERCHOLESTEROLEMIA 03/23/2007  . HYPERTENSION 03/23/2007  . POSTMENOPAUSAL STATUS 03/23/2007  . MURMUR 03/23/2007   Past Medical History  Diagnosis Date  . Hypertension   . 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 Hyperlipidemia   . Heart murmur   . Fibroids   . Gallstones     incidental asymptomatic gallstones   No past surgical history on file. History  Substance Use Topics  . Smoking status: Never Smoker   . Smokeless tobacco: Not on file  . Alcohol Use: No   Family History  Problem Relation Age of Onset  . Hypertension Mother   . Diabetes Mother   . Osteoporosis Sister   . Diabetes Brother    Allergies  Allergen Reactions  . Ibuprofen     REACTION: palpitations  . Sulfamethoxazole-Trimethoprim     REACTION: rash   Current Outpatient Prescriptions on File Prior to Visit  Medication Sig Dispense Refill  . Blood Glucose Monitoring Suppl (ACCU-CHEK AVIVA PLUS) W/DEVICE KIT Check glucose once daily and as needed for DM 250.0       . glucose blood (ACCU-CHEK AVIVA) test strip Use as directed to check glucose once daily and as needed DM 250.0       . Lancets (ACCU-CHEK MULTICLIX) lancets Use as instructed       . lisinopril (PRINIVIL,ZESTRIL) 40 MG tablet Take 1 tablet (40 mg total) by mouth daily.  30 tablet  11  . Multiple Vitamin (MULTIVITAMIN) tablet Take 1 tablet by mouth daily.        . Omega-3 Fatty Acids (FISH OIL) 1000 MG CAPS Take 1 capsule by mouth daily.         No current facility-administered medications on file prior to visit.    Review of Systems    Review of Systems  Constitutional: Negative for fever, appetite change, fatigue and unexpected weight change.  Eyes: Negative for pain and visual disturbance.  Respiratory: Negative for cough and shortness of breath.   Cardiovascular: Negative for cp or palpitations    Gastrointestinal: Negative for nausea,  diarrhea and constipation.  Genitourinary: Negative for urgency and frequency.  Skin: Negative for pallor or rash   Neurological: Negative for weakness, light-headedness, numbness and headaches.  Hematological: Negative for adenopathy. Does not bruise/bleed easily.  Psychiatric/Behavioral: Negative for dysphoric mood. The patient is not nervous/anxious.      Objective:   Physical Exam  Constitutional:  She appears well-developed and well-nourished. No distress.  HENT:  Head: Normocephalic and atraumatic.  Right Ear: External ear normal.  Left Ear: External ear normal.  Mouth/Throat: Oropharynx is clear and moist.  Eyes: Conjunctivae and EOM are normal. Pupils are equal, round, and reactive to light. No scleral icterus.  Neck: Normal range of motion. Neck supple. No JVD present. Carotid bruit is not present. No thyromegaly present.  Cardiovascular: Normal rate, regular rhythm and intact distal pulses.  Exam reveals no gallop.   Murmur heard. Pulmonary/Chest: Effort normal and breath sounds normal. No respiratory distress. She has no wheezes. She exhibits no tenderness.  Abdominal: Soft. Bowel sounds are normal. She exhibits no distension, no abdominal bruit and no mass. There is no tenderness.  Genitourinary: No breast swelling, tenderness, discharge or bleeding.  Breast exam: No mass, nodules, thickening, tenderness, bulging, retraction, inflamation, nipple discharge or skin changes noted.  No axillary or clavicular LA.    Musculoskeletal: Normal range of motion. She exhibits no edema and no tenderness.  No kyphosis  Lymphadenopathy:    She has no cervical adenopathy.  Neurological: She is alert. She has normal reflexes. No cranial nerve deficit. She exhibits normal muscle tone. Coordination normal.  Skin: Skin is warm and dry. No rash noted. No erythema. No pallor.  Psychiatric: She has a normal mood and affect.          Assessment & Plan:

## 2013-03-08 NOTE — Assessment & Plan Note (Signed)
Reviewed health habits including diet and exercise and skin cancer prevention Reviewed appropriate screening tests for age  Also reviewed health mt list, fam hx and immunization status , as well as social and family history   See HPI Will call about coverage of zoster vaccine again

## 2013-03-08 NOTE — Assessment & Plan Note (Signed)
Stable  Diet control Rev low glycemic diet and exercise F/u 6 mo

## 2013-03-08 NOTE — Assessment & Plan Note (Signed)
Not opt control  Add hctz 25  Continue ace F/u planned  Lab rev

## 2013-03-08 NOTE — Assessment & Plan Note (Signed)
Disc goals for lipids and reasons to control them Rev labs with pt Rev low sat fat diet in detail Overall stable   

## 2013-03-10 ENCOUNTER — Telehealth: Payer: Self-pay

## 2013-03-10 NOTE — Telephone Encounter (Signed)
Relevant patient education assigned to patient using Emmi. ° °

## 2013-03-22 ENCOUNTER — Telehealth: Payer: Self-pay | Admitting: Family Medicine

## 2013-03-22 NOTE — Telephone Encounter (Signed)
Relevant patient education mailed to patient.  

## 2013-03-31 ENCOUNTER — Ambulatory Visit (INDEPENDENT_AMBULATORY_CARE_PROVIDER_SITE_OTHER): Payer: Medicare Other | Admitting: Family Medicine

## 2013-03-31 ENCOUNTER — Encounter: Payer: Self-pay | Admitting: Family Medicine

## 2013-03-31 VITALS — BP 140/78 | HR 71 | Temp 97.8°F | Ht 61.0 in | Wt 147.5 lb

## 2013-03-31 DIAGNOSIS — I10 Essential (primary) hypertension: Secondary | ICD-10-CM

## 2013-03-31 LAB — BASIC METABOLIC PANEL
BUN: 13 mg/dL (ref 6–23)
CALCIUM: 9.8 mg/dL (ref 8.4–10.5)
CO2: 29 mEq/L (ref 19–32)
Chloride: 101 mEq/L (ref 96–112)
Creatinine, Ser: 1 mg/dL (ref 0.4–1.2)
GFR: 68.71 mL/min (ref >60.00)
Glucose, Bld: 146 mg/dL — ABNORMAL HIGH (ref 70–99)
Potassium: 3.5 mEq/L (ref 3.5–5.1)
SODIUM: 139 meq/L (ref 135–145)

## 2013-03-31 NOTE — Progress Notes (Signed)
Pre-visit discussion using our clinic review tool. No additional management support is needed unless otherwise documented below in the visit note.  

## 2013-03-31 NOTE — Patient Instructions (Signed)
Blood pressure is better today Eat a healthy diet and stay active No change in medicines  Continue current medicines

## 2013-03-31 NOTE — Progress Notes (Signed)
   Subjective:    Patient ID: [name], female    DOB: [birth year], 73 y.o.   MRN: [id number]  HPI Here for f.u of HTN   bp is improved today Last visit added hctz 25 mg - no problems with that  No cp or palpitations or headaches or edema  No side effects to medicines  BP Readings from Last 3 Encounters:  03/31/13 140/78  03/07/13 150/90  08/31/12 134/78       Chemistry      Component Value Date/Time   NA 141 02/28/2013 0808   K 3.8 02/28/2013 0808   CL 107 02/28/2013 0808   CO2 28 02/28/2013 0808   BUN 15 02/28/2013 0808   CREATININE 1.0 02/28/2013 0808      Component Value Date/Time   CALCIUM 9.4 02/28/2013 0808   ALKPHOS 63 02/28/2013 0808   AST 20 02/28/2013 0808   ALT 17 02/28/2013 0808   BILITOT 0.6 02/28/2013 0808       Review of Systems Review of Systems  Constitutional: Negative for fever, appetite change, fatigue and unexpected weight change.  Eyes: Negative for pain and visual disturbance.  Respiratory: Negative for cough and shortness of breath.   Cardiovascular: Negative for cp or palpitations    Gastrointestinal: Negative for nausea, diarrhea and constipation.  Genitourinary: Negative for urgency and frequency.  Skin: Negative for pallor or rash   Neurological: Negative for weakness, light-headedness, numbness and headaches.  Hematological: Negative for adenopathy. Does not bruise/bleed easily.  Psychiatric/Behavioral: Negative for dysphoric mood. The patient is not nervous/anxious.         Objective:   Physical Exam  Constitutional: She appears well-developed and well-nourished. No distress.  HENT:  Head: Normocephalic and atraumatic.  Mouth/Throat: Oropharynx is clear and moist.  Eyes: Conjunctivae and EOM are normal. Pupils are equal, round, and reactive to light. No scleral icterus.  Neck: Normal range of motion. Neck supple. No JVD present. Carotid bruit is not present. No thyromegaly present.  Cardiovascular: Normal rate, regular rhythm and  intact distal pulses.   Pulmonary/Chest: Effort normal and breath sounds normal. She has no rales.  No crackles   Musculoskeletal: She exhibits no edema.  Lymphadenopathy:    She has no cervical adenopathy.  Neurological: She is alert. She has normal reflexes.  Skin: Skin is warm and dry. No rash noted.  Psychiatric: She has a normal mood and affect.          Assessment & Plan:

## 2013-04-02 NOTE — Assessment & Plan Note (Signed)
imporved with hctz  BP: 140/78 mmHg  bp in fair control at this time  No changes needed Disc lifstyle change with low sodium diet and exercise   Lab today

## 2013-04-03 ENCOUNTER — Telehealth: Payer: Self-pay | Admitting: Family Medicine

## 2013-04-03 NOTE — Telephone Encounter (Signed)
Relevant patient education assigned to patient using Emmi. ° °

## 2013-04-07 ENCOUNTER — Ambulatory Visit: Payer: Medicare Other | Admitting: Family Medicine

## 2013-05-01 ENCOUNTER — Encounter: Payer: Self-pay | Admitting: Family Medicine

## 2013-06-21 ENCOUNTER — Telehealth: Payer: Self-pay | Admitting: Family Medicine

## 2013-06-21 NOTE — Telephone Encounter (Signed)
Please give Tdap first and then wait a month for zoster vaccine (I do not like to give it with other vaccines)-thanks

## 2013-06-21 NOTE — Telephone Encounter (Signed)
Pt came in office today requesting shingles vaccine and tdap vaccine. Pt states her insurance will cover.

## 2013-06-22 NOTE — Telephone Encounter (Signed)
Left voicemail requesting pt to call office back and schedule the Tdap vaccine and then a month later her shingles vaccine

## 2013-08-27 ENCOUNTER — Telehealth: Payer: Self-pay | Admitting: Family Medicine

## 2013-08-27 DIAGNOSIS — E119 Type 2 diabetes mellitus without complications: Secondary | ICD-10-CM

## 2013-08-27 DIAGNOSIS — E7800 Pure hypercholesterolemia, unspecified: Secondary | ICD-10-CM

## 2013-08-27 DIAGNOSIS — I10 Essential (primary) hypertension: Secondary | ICD-10-CM

## 2013-08-27 NOTE — Telephone Encounter (Signed)
Message copied by Abner Greenspan on Sun Aug 27, 2013  5:53 PM ------      [name]      Created: Wed Aug 23, 2013  5:26 PM      Regarding: Lab orders for Monday, 7.13.15       Lab orders for f/u appt ------

## 2013-08-28 ENCOUNTER — Other Ambulatory Visit (INDEPENDENT_AMBULATORY_CARE_PROVIDER_SITE_OTHER): Payer: Medicare Other

## 2013-08-28 DIAGNOSIS — E119 Type 2 diabetes mellitus without complications: Secondary | ICD-10-CM

## 2013-08-28 DIAGNOSIS — E7800 Pure hypercholesterolemia, unspecified: Secondary | ICD-10-CM

## 2013-08-28 DIAGNOSIS — I10 Essential (primary) hypertension: Secondary | ICD-10-CM

## 2013-08-28 LAB — HEMOGLOBIN A1C: Hgb A1c MFr Bld: 6.7 % — ABNORMAL HIGH (ref 4.6–6.5)

## 2013-08-28 LAB — COMPREHENSIVE METABOLIC PANEL
ALBUMIN: 4 g/dL (ref 3.5–5.2)
ALK PHOS: 63 U/L (ref 39–117)
ALT: 16 U/L (ref 0–35)
AST: 26 U/L (ref 0–37)
BUN: 16 mg/dL (ref 6–23)
CALCIUM: 9.7 mg/dL (ref 8.4–10.5)
CO2: 31 mEq/L (ref 19–32)
Chloride: 105 mEq/L (ref 96–112)
Creatinine, Ser: 1.1 mg/dL (ref 0.4–1.2)
GFR: 63.57 mL/min (ref >60.00)
Glucose, Bld: 117 mg/dL — ABNORMAL HIGH (ref 70–99)
POTASSIUM: 3.5 meq/L (ref 3.5–5.1)
SODIUM: 141 meq/L (ref 135–145)
TOTAL PROTEIN: 7.8 g/dL (ref 6.0–8.3)
Total Bilirubin: 0.8 mg/dL (ref 0.2–1.2)

## 2013-08-28 LAB — LIPID PANEL
CHOL/HDL RATIO: 3
Cholesterol: 207 mg/dL — ABNORMAL HIGH (ref 0–200)
HDL: 81.1 mg/dL (ref >39.00)
LDL CALC: 110 mg/dL — AB (ref 0–99)
NONHDL: 125.9
Triglycerides: 79 mg/dL (ref 0.0–149.0)
VLDL: 15.8 mg/dL (ref 0.0–40.0)

## 2013-08-29 ENCOUNTER — Ambulatory Visit: Payer: Self-pay | Admitting: Ophthalmology

## 2013-08-29 LAB — POTASSIUM: POTASSIUM: 3.3 mmol/L — AB (ref 3.5–5.1)

## 2013-08-30 ENCOUNTER — Ambulatory Visit (INDEPENDENT_AMBULATORY_CARE_PROVIDER_SITE_OTHER): Payer: Medicare Other | Admitting: Family Medicine

## 2013-08-30 ENCOUNTER — Encounter: Payer: Self-pay | Admitting: Family Medicine

## 2013-08-30 VITALS — BP 148/88 | HR 98 | Temp 99.1°F | Ht 61.0 in | Wt 148.5 lb

## 2013-08-30 DIAGNOSIS — R9431 Abnormal electrocardiogram [ECG] [EKG]: Secondary | ICD-10-CM | POA: Insufficient documentation

## 2013-08-30 DIAGNOSIS — J209 Acute bronchitis, unspecified: Secondary | ICD-10-CM

## 2013-08-30 MED ORDER — AZITHROMYCIN 250 MG PO TABS: ORAL_TABLET | ORAL | Status: DC

## 2013-08-30 MED ORDER — BENZONATATE 200 MG PO CAPS: 200.0000 mg | ORAL_CAPSULE | Freq: Three times a day (TID) | ORAL | Status: DC | PRN

## 2013-08-30 NOTE — Progress Notes (Signed)
Subjective:    Patient ID: [name], female    DOB: 1940-03-23, 73 y.o.   MRN: [id number]  HPI Here with a cough today Started over the weekend  A little prod of white phlegm No wheeze or sob  No other uri symptoms at all  No cong or st  She clears her throat frequently   Temp is 99.1 she has not felt feverish  She has been around a sick sister in law -was in ER with her all day    Has eye surgery planned tues-cataract  A prelim EKG done there showed some non specific ST changes and also prolonged QT- but faxed copy is very difficult to see Repeated that here today- there is some indication of LVH/ strain , but no prolonged QT Pt denies any cardiac symptoms   Patient Active Problem List   Diagnosis Date Noted  . Acute bronchitis 08/30/2013  . Nonspecific abnormal electrocardiogram (ECG) (EKG) 08/30/2013  . Encounter for Medicare annual wellness exam 03/07/2013  . Fullness of supraclavicular fossa 02/27/2011  . Other screening mammogram 02/25/2011  . Gynecological examination 02/25/2011  . Post-menopausal bleeding 02/25/2011  . CERVICAL RADICULOPATHY, LEFT 03/31/2010  . DM type 2 (diabetes mellitus, type 2) 07/06/2007  . FIBROIDS, UTERUS 03/23/2007  . HYPERCHOLESTEROLEMIA 03/23/2007  . HYPERTENSION 03/23/2007  . POSTMENOPAUSAL STATUS 03/23/2007  . MURMUR 03/23/2007   Past Medical History  Diagnosis Date  . Hypertension   . 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 Hyperlipidemia   . Heart murmur   . Fibroids   . Gallstones     incidental asymptomatic gallstones   No past surgical history on file. History  Substance Use Topics  . Smoking status: Never Smoker   . Smokeless tobacco: Not on file  . Alcohol Use: No   Family History  Problem Relation Age of Onset  . Hypertension Mother   . Diabetes Mother   . Osteoporosis Sister   . Diabetes Brother    Allergies  Allergen Reactions  . Ibuprofen     REACTION: palpitations  .  Sulfamethoxazole-Trimethoprim     REACTION: rash   Current Outpatient Prescriptions on File Prior to Visit  Medication Sig Dispense Refill  . Blood Glucose Monitoring Suppl (ACCU-CHEK AVIVA PLUS) W/DEVICE KIT Check glucose once daily and as needed for DM 250.0       . cholecalciferol (VITAMIN D) 1000 UNITS tablet Take 1,000 Units by mouth daily.      Marland Kitchen glucose blood (ACCU-CHEK AVIVA) test strip Use as directed to check glucose once daily and as needed DM 250.0  100 each  3  . hydrochlorothiazide (HYDRODIURIL) 25 MG tablet Take 1 tablet (25 mg total) by mouth daily. In the am  30 tablet  11  . Lancets (ACCU-CHEK MULTICLIX) lancets Check glucose once daily and as needed for diabetes DM2 250.00  100 each  3  . lisinopril (PRINIVIL,ZESTRIL) 40 MG tablet Take 1 tablet (40 mg total) by mouth daily.  30 tablet  11  . Multiple Vitamin (MULTIVITAMIN) tablet Take 1 tablet by mouth daily.         No current facility-administered medications on file prior to visit.    Review of Systems    Review of Systems  Constitutional: Negative for fever, appetite change, fatigue and unexpected weight change.  Eyes: Negative for pain and visual disturbance.  ENT neg for cong or sinus pain/ pos for clearing throat  Respiratory: Negative for wheeze  and shortness  of breath.   Cardiovascular: Negative for cp or palpitations    Gastrointestinal: Negative for nausea, diarrhea and constipation.  Genitourinary: Negative for urgency and frequency.  Skin: Negative for pallor or rash   Neurological: Negative for weakness, light-headedness, numbness and headaches.  Hematological: Negative for adenopathy. Does not bruise/bleed easily.  Psychiatric/Behavioral: Negative for dysphoric mood. The patient is not nervous/anxious.      Objective:   Physical Exam  Constitutional: She appears well-developed and well-nourished. No distress.  HENT:  Head: Normocephalic and atraumatic.  Right Ear: External ear normal.  Left Ear:  External ear normal.  Mouth/Throat: Oropharynx is clear and moist. No oropharyngeal exudate.  Nares are boggy Some clear post nasal drip noted   Eyes: Conjunctivae and EOM are normal. Pupils are equal, round, and reactive to light. Right eye exhibits no discharge. Left eye exhibits no discharge. No scleral icterus.  Neck: Normal range of motion.  Cardiovascular: Normal rate, regular rhythm and intact distal pulses.  Exam reveals no gallop.   Murmur heard. Rate in high 80s  Pulmonary/Chest: Effort normal and breath sounds normal. No respiratory distress. She has no wheezes. She has no rales.  No crackles  Harsh bs  Few scattered rhonchi   Abdominal: Soft. Bowel sounds are normal. She exhibits no distension. There is no tenderness.  Musculoskeletal: She exhibits no edema.  Lymphadenopathy:    She has no cervical adenopathy.  Neurological: She is alert.  Skin: Skin is warm and dry. No rash noted. No erythema.  Psychiatric: She has a normal mood and affect.          Assessment & Plan:   Problem List Items Addressed This Visit     Respiratory   Acute bronchitis     Worrisome in that pt recently spent a day in the ER with a friend and was exp to a lot  Will cover with zpak  Tessalon for cough  Has a low grade fever -will watch Also mindful that ace can cause cough F/u Monday- before her cataract surg for a re check      Other   Nonspecific abnormal electrocardiogram (ECG) (EKG) - Primary     Repeat EKG does not show QT prolongation and is easier to read ? Poss LVH   (she has hx of murmur)-- will plan to get echocardiogram when she is feeling better  In the meantime will update if cp or other new symptoms Reassuring exam today    Relevant Orders      EKG 12-Lead (Completed)

## 2013-08-30 NOTE — Patient Instructions (Signed)
Keep your appt for Monday for a re check  I think you have mild bronchitis  Take the zithromax as directed  Try the tessalon if needed for cough  BP: 148/88 mmHg  - today, we will see how it is next time   EKG today showed some signs of possible enlarged ventricle (or this could be normal for you)- so once you are feeling better we will refer you for an echocardiogram (ultrasound of the heart)

## 2013-08-30 NOTE — Progress Notes (Signed)
Pre visit review using our clinic review tool, if applicable. No additional management support is needed unless otherwise documented below in the visit note. 

## 2013-08-30 NOTE — Assessment & Plan Note (Signed)
Worrisome in that pt recently spent a day in the ER with a friend and was exp to a lot  Will cover with zpak  Tessalon for cough  Has a low grade fever -will watch Also mindful that ace can cause cough F/u Monday- before her cataract surg for a re check

## 2013-08-30 NOTE — Assessment & Plan Note (Signed)
Repeat EKG does not show QT prolongation and is easier to read ? Poss LVH   (she has hx of murmur)-- will plan to get echocardiogram when she is feeling better  In the meantime will update if cp or other new symptoms Reassuring exam today

## 2013-09-04 ENCOUNTER — Encounter: Payer: Self-pay | Admitting: Family Medicine

## 2013-09-04 ENCOUNTER — Ambulatory Visit (INDEPENDENT_AMBULATORY_CARE_PROVIDER_SITE_OTHER): Payer: Medicare Other | Admitting: Family Medicine

## 2013-09-04 VITALS — BP 132/70 | HR 69 | Temp 98.5°F | Ht 61.0 in | Wt 146.2 lb

## 2013-09-04 DIAGNOSIS — E119 Type 2 diabetes mellitus without complications: Secondary | ICD-10-CM

## 2013-09-04 DIAGNOSIS — R9431 Abnormal electrocardiogram [ECG] [EKG]: Secondary | ICD-10-CM

## 2013-09-04 DIAGNOSIS — E7800 Pure hypercholesterolemia, unspecified: Secondary | ICD-10-CM

## 2013-09-04 DIAGNOSIS — J209 Acute bronchitis, unspecified: Secondary | ICD-10-CM

## 2013-09-04 DIAGNOSIS — E876 Hypokalemia: Secondary | ICD-10-CM

## 2013-09-04 DIAGNOSIS — I10 Essential (primary) hypertension: Secondary | ICD-10-CM

## 2013-09-04 DIAGNOSIS — R011 Cardiac murmur, unspecified: Secondary | ICD-10-CM

## 2013-09-04 MED ORDER — POTASSIUM CHLORIDE ER 10 MEQ PO TBCR: 10.0000 meq | EXTENDED_RELEASE_TABLET | Freq: Every day | ORAL | Status: DC

## 2013-09-04 NOTE — Assessment & Plan Note (Signed)
This comes and goes -systolic Not heard today Echo scheduled

## 2013-09-04 NOTE — Assessment & Plan Note (Signed)
Signs of strain / LVH Will get echo

## 2013-09-04 NOTE — Assessment & Plan Note (Signed)
bp in fair control at this time  BP Readings from Last 1 Encounters:  09/04/13 132/70   No changes needed Disc lifstyle change with low sodium diet and exercise

## 2013-09-04 NOTE — Progress Notes (Signed)
Subjective:    Patient ID: [name], female    DOB: 11/10/1940, 73 y.o.   MRN: [id number]  HPI Here for f/u of bronchitis as well as chronic medical problems  On zpak for bronchitis  Feels better than she did - still coughing up white sputum  Only wheezed a bit on Sat - that is better No fever  Overall improving  Cataract surgery - will put off due to coughing   Last visit disc LVH on her EKG Hx of M  Wt is down 2 lb with bmi of 27  bp is stable today  No cp or palpitations or headaches or edema  No side effects to medicines  BP Readings from Last 3 Encounters:  09/04/13 140/78  08/30/13 148/88  03/31/13 140/78    She checks her bp at work occasionally - about like it is this am    Hyperlipidemia  Lab Results  Component Value Date   CHOL 207* 08/28/2013   CHOL 202* 02/28/2013   CHOL 194 08/24/2012   Lab Results  Component Value Date   HDL 81.10 08/28/2013   HDL 73.80 02/28/2013   HDL 72.80 08/24/2012   Lab Results  Component Value Date   LDLCALC 110* 08/28/2013   [place] 90 08/24/2012   LDLCALC 94 02/18/2011   Lab Results  Component Value Date   TRIG 79.0 08/28/2013   TRIG 78.0 02/28/2013   TRIG 154.0* 08/24/2012   Lab Results  Component Value Date   CHOLHDL 3 08/28/2013   CHOLHDL 3 02/28/2013   CHOLHDL 3 08/24/2012   Lab Results  Component Value Date   LDLDIRECT 111.2 02/28/2013   LDLDIRECT 122.4 02/23/2012   LDLDIRECT 110.3 08/24/2011   so close to goal for LDL - she occ eats fried chicken/ occ fried foods   Diabetes Home sugar results -- usually runs around 90-107  DM diet - she does watch her sugar intake   Exercise - she likes to walk for exercise and woks in her garden  Symptoms-none  A1C last  Lab Results  Component Value Date   HGBA1C 6.7* 08/28/2013    No problems with medications  Renal protection-on ace  Last eye exam -recent/ has upcoming cataract surg   Her opthy office called while she was in here  K was 3.3 on 7/14 - the day after we  checked it    Patient Active Problem List   Diagnosis Date Noted  . Acute bronchitis 08/30/2013  . Nonspecific abnormal electrocardiogram (ECG) (EKG) 08/30/2013  . Encounter for Medicare annual wellness exam 03/07/2013  . Fullness of supraclavicular fossa 02/27/2011  . Other screening mammogram 02/25/2011  . Gynecological examination 02/25/2011  . Post-menopausal bleeding 02/25/2011  . CERVICAL RADICULOPATHY, LEFT 03/31/2010  . DM type 2 (diabetes mellitus, type 2) 07/06/2007  . FIBROIDS, UTERUS 03/23/2007  . HYPERCHOLESTEROLEMIA 03/23/2007  . HYPERTENSION 03/23/2007  . POSTMENOPAUSAL STATUS 03/23/2007  . MURMUR 03/23/2007   Past Medical History  Diagnosis Date  . Hypertension   . 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 Hyperlipidemia   . Heart murmur   . Fibroids   . Gallstones     incidental asymptomatic gallstones   No past surgical history on file. History  Substance Use Topics  . Smoking status: Never Smoker   . Smokeless tobacco: Not on file  . Alcohol Use: No   Family History  Problem Relation Age of Onset  . Hypertension Mother   . Diabetes Mother   .  Osteoporosis Sister   . Diabetes Brother    Allergies  Allergen Reactions  . Ibuprofen     REACTION: palpitations  . Sulfamethoxazole-Trimethoprim     REACTION: rash   Current Outpatient Prescriptions on File Prior to Visit  Medication Sig Dispense Refill  . benzonatate (TESSALON) 200 MG capsule Take 1 capsule (200 mg total) by mouth 3 (three) times daily as needed for cough (swallow whole).  30 capsule  0  . Blood Glucose Monitoring Suppl (ACCU-CHEK AVIVA PLUS) W/DEVICE KIT Check glucose once daily and as needed for DM 250.0       . cholecalciferol (VITAMIN D) 1000 UNITS tablet Take 1,000 Units by mouth daily.      Marland Kitchen glucose blood (ACCU-CHEK AVIVA) test strip Use as directed to check glucose once daily and as needed DM 250.0  100 each  3  . hydrochlorothiazide (HYDRODIURIL) 25 MG tablet Take 1 tablet (25  mg total) by mouth daily. In the am  30 tablet  11  . Lancets (ACCU-CHEK MULTICLIX) lancets Check glucose once daily and as needed for diabetes DM2 250.00  100 each  3  . lisinopril (PRINIVIL,ZESTRIL) 40 MG tablet Take 1 tablet (40 mg total) by mouth daily.  30 tablet  11  . Multiple Vitamin (MULTIVITAMIN) tablet Take 1 tablet by mouth daily.         No current facility-administered medications on file prior to visit.     Review of Systems Review of Systems  Constitutional: Negative for fever, appetite change, fatigue and unexpected weight change.  Eyes: Negative for pain and visual disturbance. pos for cataracts  Respiratory: Negative for  shortness of breath.  pos for cough that is improved  Cardiovascular: Negative for cp or palpitations    Gastrointestinal: Negative for nausea, diarrhea and constipation.  Genitourinary: Negative for urgency and frequency.  Skin: Negative for pallor or rash   Neurological: Negative for weakness, light-headedness, numbness and headaches.  Hematological: Negative for adenopathy. Does not bruise/bleed easily.  Psychiatric/Behavioral: Negative for dysphoric mood. The patient is not nervous/anxious.         Objective:   Physical Exam  Constitutional: She appears well-developed and well-nourished. No distress.  HENT:  Head: Normocephalic and atraumatic.  Right Ear: External ear normal.  Left Ear: External ear normal.  Nose: Nose normal.  Mouth/Throat: Oropharynx is clear and moist.  Clear post nasal drip No sinus tenderness   Eyes: Conjunctivae and EOM are normal. Pupils are equal, round, and reactive to light. Right eye exhibits no discharge. Left eye exhibits no discharge. No scleral icterus.  Neck: Normal range of motion. Neck supple. No JVD present. No thyromegaly present.  Cardiovascular: Normal rate, regular rhythm, normal heart sounds and intact distal pulses.  Exam reveals no gallop.   No murmur heard. Murmur was NOT heard today    Pulmonary/Chest: Effort normal and breath sounds normal. No respiratory distress. She has no wheezes. She has no rales.  No crackles   Abdominal: Soft. Bowel sounds are normal. She exhibits no distension and no mass. There is no tenderness.  Musculoskeletal: She exhibits no edema and no tenderness.  Lymphadenopathy:    She has no cervical adenopathy.  Neurological: She is alert. She has normal reflexes. No cranial nerve deficit. She exhibits normal muscle tone. Coordination normal.  Skin: Skin is warm and dry. No rash noted. No erythema. No pallor.  Psychiatric: She has a normal mood and affect.          Assessment &  Plan:   Problem List Items Addressed This Visit     Cardiovascular and Mediastinum   HYPERTENSION - Primary      bp in fair control at this time  BP Readings from Last 1 Encounters:  09/04/13 132/70   No changes needed Disc lifstyle change with low sodium diet and exercise        Respiratory   Acute bronchitis     Improved but still coughing enough to re sched cataract surg Will finish zpak and use cough med prn Update if not imp further       Endocrine   DM type 2 (diabetes mellitus, type 2)      Stable  Lab Results  Component Value Date   HGBA1C 6.7* 08/28/2013    Good diet and exercise  F/u 6 mo      Other   HYPERCHOLESTEROLEMIA     Lipids reviewed  LDL needs to come down 10 pt to be at goal Rev low sat fat diet     MURMUR     This comes and goes -systolic Not heard today Echo scheduled     Relevant Orders      2D Echocardiogram without contrast   Nonspecific abnormal electrocardiogram (ECG) (EKG)     Signs of strain / LVH Will get echo     Relevant Orders      2D Echocardiogram without contrast   Hypokalemia     Per [place] eye doctor was 3.3 Likely from hctz  suppl 10 meq daily and obs  No symptoms

## 2013-09-04 NOTE — Progress Notes (Signed)
Pre visit review using our clinic review tool, if applicable. No additional management support is needed unless otherwise documented below in the visit note. 

## 2013-09-04 NOTE — Assessment & Plan Note (Signed)
Lipids reviewed  LDL needs to come down 10 pt to be at goal Rev low sat fat diet

## 2013-09-04 NOTE — Patient Instructions (Addendum)
Re schedule your cataract surgery when you are better  If cough remains in 2 mote weeks let me know Start the potassium pill daily  We will refer you for an echocardiogram to check heart size  Labs are stable

## 2013-09-04 NOTE — Assessment & Plan Note (Signed)
Per  eye doctor was 3.3 Likely from hctz  suppl 10 meq daily and obs  No symptoms

## 2013-09-04 NOTE — Assessment & Plan Note (Signed)
Stable  Lab Results  Component Value Date   HGBA1C 6.7* 08/28/2013    Good diet and exercise  F/u 6 mo

## 2013-09-04 NOTE — Assessment & Plan Note (Signed)
Improved but still coughing enough to re sched cataract surg Will finish zpak and use cough med prn Update if not imp further

## 2013-09-06 ENCOUNTER — Other Ambulatory Visit (HOSPITAL_COMMUNITY): Payer: Self-pay | Admitting: *Deleted

## 2013-09-12 ENCOUNTER — Other Ambulatory Visit (HOSPITAL_COMMUNITY): Payer: Self-pay | Admitting: *Deleted

## 2013-09-12 DIAGNOSIS — R011 Cardiac murmur, unspecified: Secondary | ICD-10-CM

## 2013-09-28 ENCOUNTER — Other Ambulatory Visit (INDEPENDENT_AMBULATORY_CARE_PROVIDER_SITE_OTHER): Payer: Medicare Other

## 2013-09-28 ENCOUNTER — Other Ambulatory Visit: Payer: Self-pay

## 2013-09-28 DIAGNOSIS — R011 Cardiac murmur, unspecified: Secondary | ICD-10-CM

## 2013-09-28 DIAGNOSIS — I359 Nonrheumatic aortic valve disorder, unspecified: Secondary | ICD-10-CM

## 2013-09-28 DIAGNOSIS — I369 Nonrheumatic tricuspid valve disorder, unspecified: Secondary | ICD-10-CM

## 2013-09-28 DIAGNOSIS — I379 Nonrheumatic pulmonary valve disorder, unspecified: Secondary | ICD-10-CM

## 2013-10-19 ENCOUNTER — Encounter: Payer: Self-pay | Admitting: Family Medicine

## 2014-01-09 ENCOUNTER — Ambulatory Visit: Payer: Self-pay | Admitting: Ophthalmology

## 2014-01-09 LAB — POTASSIUM: Potassium: 3.5 mmol/L (ref 3.5–5.1)

## 2014-01-16 ENCOUNTER — Ambulatory Visit: Payer: Self-pay | Admitting: Ophthalmology

## 2014-01-17 LAB — HM DIABETES EYE EXAM

## 2014-01-30 ENCOUNTER — Ambulatory Visit: Payer: Self-pay | Admitting: Ophthalmology

## 2014-03-01 ENCOUNTER — Telehealth: Payer: Self-pay | Admitting: Family Medicine

## 2014-03-01 DIAGNOSIS — E7800 Pure hypercholesterolemia, unspecified: Secondary | ICD-10-CM

## 2014-03-01 DIAGNOSIS — E119 Type 2 diabetes mellitus without complications: Secondary | ICD-10-CM

## 2014-03-01 DIAGNOSIS — I10 Essential (primary) hypertension: Secondary | ICD-10-CM

## 2014-03-01 NOTE — Telephone Encounter (Signed)
-----   Message from Ellamae Sia sent at 02/21/2014  5:35 PM EST ----- Regarding: Lab orders for Friday, 1.15.16 Patient is scheduled for CPX labs, please order future labs, Thanks , Karna Christmas

## 2014-03-02 ENCOUNTER — Other Ambulatory Visit (INDEPENDENT_AMBULATORY_CARE_PROVIDER_SITE_OTHER): Payer: Medicare Other

## 2014-03-02 DIAGNOSIS — E119 Type 2 diabetes mellitus without complications: Secondary | ICD-10-CM

## 2014-03-02 DIAGNOSIS — E7800 Pure hypercholesterolemia, unspecified: Secondary | ICD-10-CM

## 2014-03-02 DIAGNOSIS — I10 Essential (primary) hypertension: Secondary | ICD-10-CM | POA: Diagnosis not present

## 2014-03-02 LAB — COMPREHENSIVE METABOLIC PANEL
ALT: 13 U/L (ref 0–35)
AST: 23 U/L (ref 0–37)
Albumin: 3.9 g/dL (ref 3.5–5.2)
Alkaline Phosphatase: 72 U/L (ref 39–117)
BILIRUBIN TOTAL: 0.5 mg/dL (ref 0.2–1.2)
BUN: 17 mg/dL (ref 6–23)
CHLORIDE: 102 meq/L (ref 96–112)
CO2: 27 mEq/L (ref 19–32)
CREATININE: 1.15 mg/dL (ref 0.40–1.20)
Calcium: 10.3 mg/dL (ref 8.4–10.5)
GFR: 59.67 mL/min — AB (ref >60.00)
GLUCOSE: 122 mg/dL — AB (ref 70–99)
POTASSIUM: 3.6 meq/L (ref 3.5–5.1)
Sodium: 140 mEq/L (ref 135–145)
TOTAL PROTEIN: 7.9 g/dL (ref 6.0–8.3)

## 2014-03-02 LAB — LIPID PANEL
CHOL/HDL RATIO: 3
Cholesterol: 200 mg/dL (ref 0–200)
HDL: 68.8 mg/dL (ref >39.00)
LDL Cholesterol: 106 mg/dL — ABNORMAL HIGH (ref 0–99)
NonHDL: 131.2
Triglycerides: 126 mg/dL (ref 0.0–149.0)
VLDL: 25.2 mg/dL (ref 0.0–40.0)

## 2014-03-02 LAB — CBC WITH DIFFERENTIAL/PLATELET
BASOS PCT: 0.4 % (ref 0.0–3.0)
Basophils Absolute: 0 10*3/uL (ref 0.0–0.1)
Eosinophils Absolute: 0.3 10*3/uL (ref 0.0–0.7)
Eosinophils Relative: 2.9 % (ref 0.0–5.0)
HCT: 39.7 % (ref 36.0–46.0)
HEMOGLOBIN: 13.1 g/dL (ref 12.0–15.0)
LYMPHS PCT: 36.8 % (ref 12.0–46.0)
Lymphs Abs: 3.3 10*3/uL (ref 0.7–4.0)
MCHC: 32.9 g/dL (ref 30.0–36.0)
MCV: 84.3 fl (ref 78.0–100.0)
Monocytes Absolute: 0.7 10*3/uL (ref 0.1–1.0)
Monocytes Relative: 8 % (ref 3.0–12.0)
Neutro Abs: 4.6 10*3/uL (ref 1.4–7.7)
Neutrophils Relative %: 51.9 % (ref 43.0–77.0)
Platelets: 315 10*3/uL (ref 150.0–400.0)
RBC: 4.7 Mil/uL (ref 3.87–5.11)
RDW: 14.3 % (ref 11.5–15.5)
WBC: 8.9 10*3/uL (ref 4.0–10.5)

## 2014-03-02 LAB — TSH: TSH: 3.67 u[IU]/mL (ref 0.35–4.50)

## 2014-03-02 LAB — HEMOGLOBIN A1C: Hgb A1c MFr Bld: 6.9 % — ABNORMAL HIGH (ref 4.6–6.5)

## 2014-03-07 ENCOUNTER — Other Ambulatory Visit: Payer: Self-pay | Admitting: Family Medicine

## 2014-03-09 ENCOUNTER — Encounter: Payer: Medicare Other | Admitting: Family Medicine

## 2014-03-19 ENCOUNTER — Other Ambulatory Visit: Payer: Self-pay | Admitting: Family Medicine

## 2014-03-21 ENCOUNTER — Encounter: Payer: Self-pay | Admitting: Family Medicine

## 2014-03-21 ENCOUNTER — Ambulatory Visit (INDEPENDENT_AMBULATORY_CARE_PROVIDER_SITE_OTHER): Payer: Medicare Other | Admitting: Family Medicine

## 2014-03-21 VITALS — BP 128/76 | HR 74 | Temp 97.8°F | Ht 61.0 in | Wt 150.8 lb

## 2014-03-21 DIAGNOSIS — Z0000 Encounter for general adult medical examination without abnormal findings: Secondary | ICD-10-CM

## 2014-03-21 DIAGNOSIS — E7800 Pure hypercholesterolemia, unspecified: Secondary | ICD-10-CM

## 2014-03-21 DIAGNOSIS — E119 Type 2 diabetes mellitus without complications: Secondary | ICD-10-CM

## 2014-03-21 DIAGNOSIS — Z23 Encounter for immunization: Secondary | ICD-10-CM | POA: Diagnosis not present

## 2014-03-21 DIAGNOSIS — I10 Essential (primary) hypertension: Secondary | ICD-10-CM | POA: Diagnosis not present

## 2014-03-21 MED ORDER — POTASSIUM CHLORIDE CRYS ER 10 MEQ PO TBCR: 10.0000 meq | EXTENDED_RELEASE_TABLET | Freq: Every day | ORAL | Status: DC

## 2014-03-21 MED ORDER — HYDROCHLOROTHIAZIDE 25 MG PO TABS: 25.0000 mg | ORAL_TABLET | Freq: Every day | ORAL | Status: DC

## 2014-03-21 MED ORDER — LISINOPRIL 40 MG PO TABS: 40.0000 mg | ORAL_TABLET | Freq: Every day | ORAL | Status: DC

## 2014-03-21 NOTE — Patient Instructions (Signed)
prevnar vaccine today  If you are interested in a shingles/zoster vaccine - call your insurance to check on coverage,( you should not get it within 1 month of other vaccines) , then call us for a prescription  for it to take to a pharmacy that gives the shot , or make a nurse visit to get it here depending on your coverage, and the health dept will likely be the cheapest if you have no coverage Please work on an advanced directive (living will)- I gave you a packet on it   Eat low cholesterol and low sugar diet  Follow up in 6 months with labs prior

## 2014-03-21 NOTE — Progress Notes (Signed)
Pre visit review using our clinic review tool, if applicable. No additional management support is needed unless otherwise documented below in the visit note. 

## 2014-03-21 NOTE — Progress Notes (Signed)
Subjective:    Patient ID: [name], female    DOB: May 06, 1940, 74 y.o.   MRN: [id number]  HPI Here for annual medicare wellness visit as well as chronic/acute medical problems   I have personally reviewed the Medicare Annual Wellness questionnaire and have noted 1. The patient's medical and social history 2. Their use of alcohol, tobacco or illicit drugs 3. Their current medications and supplements 4. The patient's functional ability including ADL's, fall risks, home safety risks and hearing or visual             impairment. 5. Diet and physical activities 6. Evidence for depression or mood disorders  The patients weight, height, BMI have been recorded in the chart and visual acuity is per eye clinic.  I have made referrals, counseling and provided education to the patient based review of the above and I have provided the pt with a written personalized care plan for preventive services.  See scanned forms.  Routine anticipatory guidance given to patient.  See health maintenance. Colon cancer screening 5/11 - 10 year recall  Breast cancer screening 3/15 nl  Self breast exam-no lumps  Flu vaccine got on 11/21/13 at [facility] Tetanus vaccine 5/15 Pneumovax 6/10 wants to get prevnar today Zoster vaccine- she will check into that at the health dept/unsure of coverage  Advance directive- she does not have /given packet to work on  Cognitive function addressed- see scanned forms- and if abnormal then additional documentation follows.  Pretty good memory/mentally sharp   PMH and SH reviewed  Meds, vitals, and allergies reviewed.   ROS: See HPI.  Otherwise negative.    Wt is up 4 lb with bmi of 28 Diet is fair   DM Last eye exam 12/15  Also had cataracts done both sides - doing well  Lab Results  Component Value Date   HGBA1C 6.9* 03/02/2014   This is up from 6.7 She ate more at the holidays and the week of her labs  Is back to normal  Walks for exercise    Hyperlipidemia Lab Results  Component Value Date   CHOL 200 03/02/2014   CHOL 207* 08/28/2013   CHOL 202* 02/28/2013   Lab Results  Component Value Date   HDL 68.80 03/02/2014   HDL 81.10 08/28/2013   HDL 73.80 02/28/2013   Lab Results  Component Value Date   LDLCALC 106* 03/02/2014   LDLCALC 110* 08/28/2013   LDLCALC 90 08/24/2012   Lab Results  Component Value Date   TRIG 126.0 03/02/2014   TRIG 79.0 08/28/2013   TRIG 78.0 02/28/2013   Lab Results  Component Value Date   CHOLHDL 3 03/02/2014   CHOLHDL 3 08/28/2013   CHOLHDL 3 02/28/2013   Lab Results  Component Value Date   LDLDIRECT 111.2 02/28/2013   LDLDIRECT 122.4 02/23/2012   LDLDIRECT 110.3 08/24/2011   will keep working on diet to get LDL under 100   bp is stable today  No cp or palpitations or headaches or edema  No side effects to medicines  BP Readings from Last 3 Encounters:  03/21/14 128/76  09/04/13 132/70  08/30/13 148/88       Patient Active Problem List   Diagnosis Date Noted  . Hypokalemia 09/04/2013  . Nonspecific abnormal electrocardiogram (ECG) (EKG) 08/30/2013  . Encounter for Medicare annual wellness exam 03/07/2013  . Fullness of supraclavicular fossa 02/27/2011  . Other screening mammogram 02/25/2011  . Gynecological examination 02/25/2011  . Post-menopausal bleeding 02/25/2011  .  CERVICAL RADICULOPATHY, LEFT 03/31/2010  . DM type 2 (diabetes mellitus, type 2) 07/06/2007  . FIBROIDS, UTERUS 03/23/2007  . HYPERCHOLESTEROLEMIA 03/23/2007  . Essential hypertension 03/23/2007  . POSTMENOPAUSAL STATUS 03/23/2007  . MURMUR 03/23/2007   Past Medical History  Diagnosis Date  . Hypertension   . 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 Hyperlipidemia   . Heart murmur   . Fibroids   . Gallstones     incidental asymptomatic gallstones   No past surgical history on file. History  Substance Use Topics  . Smoking status: Never Smoker   . Smokeless tobacco: Not on file  .  Alcohol Use: No   Family History  Problem Relation Age of Onset  . Hypertension Mother   . Diabetes Mother   . Osteoporosis Sister   . Diabetes Brother    Allergies  Allergen Reactions  . Ibuprofen     REACTION: palpitations  . Sulfamethoxazole-Trimethoprim     REACTION: rash   Current Outpatient Prescriptions on File Prior to Visit  Medication Sig Dispense Refill  . ACCU-CHEK AVIVA PLUS test strip USE AS DIRECTED TO CHECK GLUCOSE ONCE DAILY AND AS NEEDED 100 each 0  . Blood Glucose Monitoring Suppl (ACCU-CHEK AVIVA PLUS) W/DEVICE KIT Check glucose once daily and as needed for DM 250.0     . cholecalciferol (VITAMIN D) 1000 UNITS tablet Take 1,000 Units by mouth daily.    . hydrochlorothiazide (HYDRODIURIL) 25 MG tablet Take 1 tablet (25 mg total) by mouth daily. In the am 30 tablet 11  . Lancets (ACCU-CHEK MULTICLIX) lancets Check glucose once daily and as needed for diabetes DM2 250.00 100 each 3  . lisinopril (PRINIVIL,ZESTRIL) 40 MG tablet TAKE 1 TABLET BY MOUTH DAILY 30 tablet 0  . Multiple Vitamin (MULTIVITAMIN) tablet Take 1 tablet by mouth daily.      . potassium chloride (K-DUR,KLOR-CON) 10 MEQ tablet TAKE 1 TABLET BY MOUTH DAILY 30 tablet 0   No current facility-administered medications on file prior to visit.    Review of Systems Review of Systems  Constitutional: Negative for fever, appetite change, fatigue and unexpected weight change.  Eyes: Negative for pain and visual disturbance.  Respiratory: Negative for cough and shortness of breath.   Cardiovascular: Negative for cp or palpitations    Gastrointestinal: Negative for nausea, diarrhea and constipation.  Genitourinary: Negative for urgency and frequency.  Skin: Negative for pallor or rash   Neurological: Negative for weakness, light-headedness, numbness and headaches.  Hematological: Negative for adenopathy. Does not bruise/bleed easily.  Psychiatric/Behavioral: Negative for dysphoric mood. The patient is not  nervous/anxious.         Objective:   Physical Exam  Constitutional: She appears well-developed and well-nourished. No distress.  overwt and well app  HENT:  Head: Normocephalic and atraumatic.  Right Ear: External ear normal.  Left Ear: External ear normal.  Mouth/Throat: Oropharynx is clear and moist.  Eyes: Conjunctivae and EOM are normal. Pupils are equal, round, and reactive to light. No scleral icterus.  Neck: Normal range of motion. Neck supple. No JVD present. Carotid bruit is not present. No thyromegaly present.  Cardiovascular: Normal rate, regular rhythm, normal heart sounds and intact distal pulses.  Exam reveals no gallop.   Pulmonary/Chest: Effort normal and breath sounds normal. No respiratory distress. She has no wheezes. She exhibits no tenderness.  Abdominal: Soft. Bowel sounds are normal. She exhibits no distension, no abdominal bruit and no mass. There is no tenderness.  Genitourinary: No  breast swelling, tenderness, discharge or bleeding.  Breast exam: No mass, nodules, thickening, tenderness, bulging, retraction, inflamation, nipple discharge or skin changes noted.  No axillary or clavicular LA.      Musculoskeletal: Normal range of motion. She exhibits no edema or tenderness.  Lymphadenopathy:    She has no cervical adenopathy.  Neurological: She is alert. She has normal reflexes. No cranial nerve deficit. She exhibits normal muscle tone. Coordination normal.  Skin: Skin is warm and dry. No rash noted. No erythema. No pallor.  Psychiatric: She has a normal mood and affect.          Assessment & Plan:   Problem List Items Addressed This Visit      Cardiovascular and Mediastinum   Essential hypertension    bp in fair control at this time  BP Readings from Last 1 Encounters:  03/21/14 128/76   No changes needed Disc lifstyle change with low sodium diet and exercise   Lab reviewed       Relevant Medications   hydrochlorothiazide tablet   lisinopril  (PRINIVIL,ZESTRIL) tablet     Endocrine   DM type 2 (diabetes mellitus, type 2)    Lab Results  Component Value Date   HGBA1C 6.9* 03/02/2014   This is up slt but well controlled  F/u planned Disc low glycemic diet       Relevant Medications   lisinopril (PRINIVIL,ZESTRIL) tablet     Other   Encounter for Medicare annual wellness exam - Primary    Reviewed health habits including diet and exercise and skin cancer prevention Reviewed appropriate screening tests for age  Also reviewed health mt list, fam hx and immunization status , as well as social and family history   See HPI Labs reviewed prevnar today  She will check again on coverage for zoster vaccine  Also disc working on Psychologist, sport and exercise given       HYPERCHOLESTEROLEMIA    Disc goals for lipids and reasons to control them Rev labs with pt Rev low sat fat diet in detail        Relevant Medications   hydrochlorothiazide tablet   lisinopril (PRINIVIL,ZESTRIL) tablet    Other Visit Diagnoses    Need for pneumococcal vaccination        Relevant Orders    Pneumococcal conjugate vaccine 13-valent IM (Completed)

## 2014-03-21 NOTE — Assessment & Plan Note (Signed)
Reviewed health habits including diet and exercise and skin cancer prevention Reviewed appropriate screening tests for age  Also reviewed health mt list, fam hx and immunization status , as well as social and family history   See HPI Labs reviewed prevnar today  She will check again on coverage for zoster vaccine  Also disc working on Psychologist, sport and exercise given

## 2014-03-22 NOTE — Assessment & Plan Note (Signed)
bp in fair control at this time  BP Readings from Last 1 Encounters:  03/21/14 128/76   No changes needed Disc lifstyle change with low sodium diet and exercise   Lab reviewed

## 2014-03-22 NOTE — Assessment & Plan Note (Signed)
Lab Results  Component Value Date   HGBA1C 6.9* 03/02/2014   This is up slt but well controlled  F/u planned Disc low glycemic diet

## 2014-03-22 NOTE — Assessment & Plan Note (Signed)
Disc goals for lipids and reasons to control them Rev labs with pt Rev low sat fat diet in detail   

## 2014-05-08 DIAGNOSIS — Z1231 Encounter for screening mammogram for malignant neoplasm of breast: Secondary | ICD-10-CM | POA: Diagnosis not present

## 2014-05-09 ENCOUNTER — Encounter: Payer: Self-pay | Admitting: Family Medicine

## 2014-05-09 ENCOUNTER — Telehealth: Payer: Self-pay

## 2014-05-09 NOTE — Telephone Encounter (Signed)
Patient aware of normal mammogram results and recommendations.

## 2014-05-09 NOTE — Telephone Encounter (Signed)
-----   Message from Abner Greenspan, MD sent at 05/09/2014 10:09 AM EDT ----- Mammogram is normal  Please note for flow sheet if you can  Due for next screening mammogram in 1 year

## 2014-06-09 NOTE — Op Note (Signed)
PATIENT NAME:  [name], [name] MR#:  [id number] DATE OF BIRTH:  [birth year]  DATE OF PROCEDURE:  01/16/2014  LOCATION: [facility]  PREOPERATIVE DIAGNOSIS:  Nuclear sclerotic cataract of the right eye.   POSTOPERATIVE DIAGNOSIS:  Nuclear sclerotic cataract of the right eye.   OPERATIVE PROCEDURE:  Cataract extraction by phacoemulsification with implant of intraocular lens to the right eye.   SURGEON:  Birder Robson, MD   ANESTHESIA:  1. Managed anesthesia care.  2. Topical tetracaine drops followed by 2% Xylocaine jelly applied in the preoperative holding area.   COMPLICATIONS:  None.   TECHNIQUE:   Stop and chop.  DESCRIPTION OF PROCEDURE:  The patient was examined and consented in the preoperative holding area where the aforementioned topical anesthesia was applied to the right eye and then brought back to the Operating Room where the right eye was prepped and draped in the usual sterile ophthalmic fashion and a lid speculum was placed. A paracentesis was created with the side port blade and the anterior chamber was filled with viscoelastic. A near clear corneal incision was performed with the steel keratome. A continuous curvilinear capsulorrhexis was performed with a cystotome followed by the capsulorrhexis forceps. Hydrodissection and hydrodelineation were carried out with BSS on a blunt cannula. The lens was removed in a stop and chop technique and the remaining cortical material was removed with the irrigation-aspiration handpiece. The capsular bag was inflated with viscoelastic and the Tecnis ZCB00 27.0 diopter lens, serial number [id number] was placed in the capsular bag without complication. The remaining viscoelastic was removed from the eye with the irrigation-aspiration handpiece. The wounds were hydrated. The anterior chamber was flushed with Miostat and the eye was inflated to physiologic pressure. 0.1 mL of cefuroxime concentration 10 mg/mL was placed in the anterior chamber. The  wounds were found to be water tight. The eye was dressed with Vigamox.   The patient was given protective glasses to wear throughout the day and a shield with which to sleep tonight. The patient was also given drops with which to begin a drop regimen today and will follow-up with me in one day.     ____________________________ Livingston Diones. Hakiem Malizia, MD wlp:MT D: 01/16/2014 16:14:51 ET T: 01/16/2014 17:17:52 ET JOB#: [id number]  cc: Jiro Kiester L. Olla Delancey, MD, <Dictator> Livingston Diones Gerritt Galentine MD ELECTRONICALLY SIGNED 01/17/2014 12:16

## 2014-06-09 NOTE — Op Note (Signed)
PATIENT NAME:  [name], [name] MR#:  [id number] DATE OF BIRTH:  1940/10/01  DATE OF PROCEDURE:  01/30/2014  PREOPERATIVE DIAGNOSIS:  Nuclear sclerotic cataract of the left eye.   POSTOPERATIVE DIAGNOSIS:  Nuclear sclerotic cataract of the left eye.   OPERATIVE PROCEDURE:  Cataract extraction by phacoemulsification with implant of intraocular lens to left eye.   SURGEON:  Birder Robson, MD.   ANESTHESIA:  1. Managed anesthesia care.  2. Topical tetracaine drops followed by 2% Xylocaine jelly applied in the preoperative holding area.   COMPLICATIONS:  None.   TECHNIQUE:   Stop and chop   DESCRIPTION OF PROCEDURE:  The patient was examined and consented in the preoperative holding area where the aforementioned topical anesthesia was applied to the left eye and then brought back to the Operating Room where the left eye was prepped and draped in the usual sterile ophthalmic fashion and a lid speculum was placed. A paracentesis was created with the side port blade and the anterior chamber was filled with viscoelastic. A near clear corneal incision was performed with the steel keratome. A continuous curvilinear capsulorrhexis was performed with a cystotome followed by the capsulorrhexis forceps. Hydrodissection and hydrodelineation were carried out with BSS on a blunt cannula. The lens was removed in a stop and chop technique and the remaining cortical material was removed with the irrigation-aspiration handpiece. The capsular bag was inflated with viscoelastic and the Tecnis ZCB00 27.0-diopter lens, serial number [id number] was placed in the capsular bag without complication. The remaining viscoelastic was removed from the eye with the irrigation-aspiration handpiece. The wounds were hydrated. The anterior chamber was flushed with Miostat and the eye was inflated to physiologic pressure. 0.1 mL of cefuroxime concentration 10 mg/mL was placed in the anterior chamber. The wounds were found to be water  tight. The eye was dressed with Vigamox. The patient was given protective glasses to wear throughout the day and a shield with which to sleep tonight. The patient was also given drops with which to begin a drop regimen today and will follow-up with me in one day.    ____________________________ Livingston Diones. Ravin Bendall, MD wlp:jp D: 01/30/2014 22:01:29 ET T: 01/31/2014 08:32:08 ET JOB#: [id number]  cc: Ariyon Gerstenberger L. Robt Okuda, MD, <Dictator> Livingston Diones Salinda Snedeker MD ELECTRONICALLY SIGNED 01/31/2014 13:57

## 2014-06-12 ENCOUNTER — Encounter: Payer: Self-pay | Admitting: Primary Care

## 2014-06-12 ENCOUNTER — Ambulatory Visit (INDEPENDENT_AMBULATORY_CARE_PROVIDER_SITE_OTHER): Payer: Medicare Other | Admitting: Primary Care

## 2014-06-12 VITALS — BP 132/74 | HR 87 | Temp 97.9°F | Ht 61.0 in | Wt 149.8 lb

## 2014-06-12 DIAGNOSIS — Z23 Encounter for immunization: Secondary | ICD-10-CM | POA: Diagnosis not present

## 2014-06-12 MED ORDER — ZOSTER VACCINE LIVE 19400 UNT/0.65ML ~~LOC~~ SOLR: 0.6500 mL | Freq: Once | SUBCUTANEOUS | Status: DC

## 2014-06-12 NOTE — Patient Instructions (Signed)
Your rash does not appear to be related to shingles. It appears as though you may have a small insect bite with some skin irritation. Try using Hydrocortisone cream which may be purchased over the counter for itching. You've been provided your shingles vaccine prescription to be administered at your pharmacy as requested. It was a pleasure meeting you! Take care!

## 2014-06-12 NOTE — Progress Notes (Signed)
Subjective:    Patient ID: [name], female    DOB: 02-Oct-1940, 74 y.o.   MRN: [id number]  HPI  [name] is a 74 year old female who presents today with a chief complaint of rash to right side of body that she first noticed yesterday. She describes the rash as itchy and denies pain. She's tried applying vicks vapor rub without relief. This morning she noticed the rash was red. Does not have any pets in the house, reports her house to be clean house, denies changes in any detergents, soaps, shampoos. No one else in her household reports these symptoms.   Review of Systems  Constitutional: Negative for fever and chills.  Respiratory: Negative for shortness of breath.   Cardiovascular: Negative for chest pain.  Skin: Positive for rash.  Neurological: Negative for numbness.       Past Medical History  Diagnosis Date  . Hypertension   . 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 Hyperlipidemia   . Heart murmur   . Fibroids   . Gallstones     incidental asymptomatic gallstones    History   Social History  . Marital Status: Married    Spouse Name: N/A  . Number of Children: N/A  . Years of Education: N/A   Occupational History  . Not on file.   Social History Main Topics  . Smoking status: Never Smoker   . Smokeless tobacco: Not on file  . Alcohol Use: No  . Drug Use: No  . Sexual Activity: Not on file   Other Topics Concern  . Not on file   Social History Narrative    No past surgical history on file.  Family History  Problem Relation Age of Onset  . Hypertension Mother   . Diabetes Mother   . Osteoporosis Sister   . Diabetes Brother     Allergies  Allergen Reactions  . Ibuprofen     REACTION: palpitations  . Sulfamethoxazole-Trimethoprim     REACTION: rash    Current Outpatient Prescriptions on File Prior to Visit  Medication Sig Dispense Refill  . ACCU-CHEK AVIVA PLUS test strip USE AS DIRECTED TO CHECK GLUCOSE ONCE DAILY AND AS NEEDED 100 each  0  . Blood Glucose Monitoring Suppl (ACCU-CHEK AVIVA PLUS) W/DEVICE KIT Check glucose once daily and as needed for DM 250.0     . cholecalciferol (VITAMIN D) 1000 UNITS tablet Take 1,000 Units by mouth daily.    . hydrochlorothiazide (HYDRODIURIL) 25 MG tablet Take 1 tablet (25 mg total) by mouth daily. In the am 30 tablet 11  . Lancets (ACCU-CHEK MULTICLIX) lancets Check glucose once daily and as needed for diabetes DM2 250.00 100 each 3  . lisinopril (PRINIVIL,ZESTRIL) 40 MG tablet Take 1 tablet (40 mg total) by mouth daily. 30 tablet 11  . Multiple Vitamin (MULTIVITAMIN) tablet Take 1 tablet by mouth daily.      . potassium chloride (K-DUR,KLOR-CON) 10 MEQ tablet Take 1 tablet (10 mEq total) by mouth daily. 30 tablet 11   No current facility-administered medications on file prior to visit.    BP 132/74 mmHg  Pulse 87  Temp(Src) 97.9 F (36.6 C) (Oral)  Ht $R'5\' 1"'KI$  (1.549 m)  Wt 149 lb 12.8 oz (67.949 kg)  BMI 28.32 kg/m2  SpO2 97%    Objective:   Physical Exam  Constitutional: She is oriented to person, place, and time. She appears well-developed.  Neck: Neck supple.  Cardiovascular: Normal rate and  regular rhythm.   Pulmonary/Chest: Effort normal and breath sounds normal.  Lymphadenopathy:    She has no cervical adenopathy.  Neurological: She is alert and oriented to person, place, and time.  Skin: Skin is warm and dry. Rash noted.  Small rash present to right lateral trunk. Does not follow a nerve root, denies pain, does not appear to be shingles. Suspect insect bite. Redness present from scratching.           Assessment & Plan:  Rash:  Suspect insect bite. Does not appear to be shingles. Denies pain. Hydrocortisone cream OTC for itching. She is requesting a prescription for Zostavax vaccine to be administered at her local pharmacy. RX provided as she's never been vaccinated. Follow up if no improvement.

## 2014-06-12 NOTE — Progress Notes (Signed)
Pre visit review using our clinic review tool, if applicable. No additional management support is needed unless otherwise documented below in the visit note. 

## 2014-07-18 ENCOUNTER — Other Ambulatory Visit: Payer: Self-pay | Admitting: Family Medicine

## 2014-08-20 LAB — HM DIABETES EYE EXAM

## 2014-08-22 DIAGNOSIS — E119 Type 2 diabetes mellitus without complications: Secondary | ICD-10-CM | POA: Diagnosis not present

## 2014-09-19 ENCOUNTER — Telehealth: Payer: Self-pay | Admitting: Family Medicine

## 2014-09-19 DIAGNOSIS — E7800 Pure hypercholesterolemia, unspecified: Secondary | ICD-10-CM

## 2014-09-19 DIAGNOSIS — I10 Essential (primary) hypertension: Secondary | ICD-10-CM

## 2014-09-19 DIAGNOSIS — E119 Type 2 diabetes mellitus without complications: Secondary | ICD-10-CM

## 2014-09-19 NOTE — Telephone Encounter (Signed)
-----   Message from Ellamae Sia sent at 09/13/2014  3:15 PM EDT ----- Regarding: Lab orders for Thursday, 8.4.16 Lab orders for a 6 month follow up appt

## 2014-09-20 ENCOUNTER — Other Ambulatory Visit (INDEPENDENT_AMBULATORY_CARE_PROVIDER_SITE_OTHER): Payer: Medicare Other

## 2014-09-20 DIAGNOSIS — E7800 Pure hypercholesterolemia, unspecified: Secondary | ICD-10-CM

## 2014-09-20 DIAGNOSIS — E119 Type 2 diabetes mellitus without complications: Secondary | ICD-10-CM

## 2014-09-20 DIAGNOSIS — I10 Essential (primary) hypertension: Secondary | ICD-10-CM

## 2014-09-20 LAB — COMPREHENSIVE METABOLIC PANEL
ALT: 15 U/L (ref 0–35)
AST: 25 U/L (ref 0–37)
Albumin: 4 g/dL (ref 3.5–5.2)
Alkaline Phosphatase: 69 U/L (ref 39–117)
BUN: 13 mg/dL (ref 6–23)
CALCIUM: 9.8 mg/dL (ref 8.4–10.5)
CHLORIDE: 104 meq/L (ref 96–112)
CO2: 28 meq/L (ref 19–32)
Creatinine, Ser: 1.02 mg/dL (ref 0.40–1.20)
GFR: 68.42 mL/min (ref >60.00)
Glucose, Bld: 114 mg/dL — ABNORMAL HIGH (ref 70–99)
Potassium: 3.5 mEq/L (ref 3.5–5.1)
SODIUM: 141 meq/L (ref 135–145)
Total Bilirubin: 0.4 mg/dL (ref 0.2–1.2)
Total Protein: 7.7 g/dL (ref 6.0–8.3)

## 2014-09-20 LAB — LIPID PANEL
CHOL/HDL RATIO: 3
CHOLESTEROL: 205 mg/dL — AB (ref 0–200)
HDL: 69.5 mg/dL (ref >39.00)
LDL CALC: 111 mg/dL — AB (ref 0–99)
NonHDL: 135.65
Triglycerides: 123 mg/dL (ref 0.0–149.0)
VLDL: 24.6 mg/dL (ref 0.0–40.0)

## 2014-09-20 LAB — HEMOGLOBIN A1C: HEMOGLOBIN A1C: 6.6 % — AB (ref 4.6–6.5)

## 2014-09-25 ENCOUNTER — Ambulatory Visit: Payer: Medicare Other | Admitting: Family Medicine

## 2014-10-02 ENCOUNTER — Encounter: Payer: Self-pay | Admitting: Family Medicine

## 2014-10-02 ENCOUNTER — Ambulatory Visit (INDEPENDENT_AMBULATORY_CARE_PROVIDER_SITE_OTHER): Payer: Medicare Other | Admitting: Family Medicine

## 2014-10-02 VITALS — BP 134/74 | HR 66 | Temp 98.1°F | Ht 61.0 in | Wt 146.0 lb

## 2014-10-02 DIAGNOSIS — E7800 Pure hypercholesterolemia, unspecified: Secondary | ICD-10-CM

## 2014-10-02 DIAGNOSIS — E119 Type 2 diabetes mellitus without complications: Secondary | ICD-10-CM

## 2014-10-02 DIAGNOSIS — I10 Essential (primary) hypertension: Secondary | ICD-10-CM | POA: Diagnosis not present

## 2014-10-02 NOTE — Progress Notes (Signed)
Subjective:    Patient ID: [name], female    DOB: 11-16-1940, 74 y.o.   MRN: [id number]  HPI Here for f/u of chronic health problems   Has been feeling good overall   Finally got the shingles shot at a drug store- had a local reaction - took benadryl and it got better   Wt is down 3 lb with bmi of 27 Has been gardening a bit - very active    bp is stable today  No cp or palpitations or headaches or edema  No side effects to medicines  BP Readings from Last 3 Encounters:  10/02/14 134/74  06/12/14 132/74  03/21/14 128/76     Hyperlipidemia Diet controlled Lab Results  Component Value Date   CHOL 205* 09/20/2014   CHOL 200 03/02/2014   CHOL 207* 08/28/2013   Lab Results  Component Value Date   HDL 69.50 09/20/2014   HDL 68.80 03/02/2014   HDL 81.10 08/28/2013   Lab Results  Component Value Date   LDLCALC 111* 09/20/2014   LDLCALC 106* 03/02/2014   LDLCALC 110* 08/28/2013   Lab Results  Component Value Date   TRIG 123.0 09/20/2014   TRIG 126.0 03/02/2014   TRIG 79.0 08/28/2013   Lab Results  Component Value Date   CHOLHDL 3 09/20/2014   CHOLHDL 3 03/02/2014   CHOLHDL 3 08/28/2013   Lab Results  Component Value Date   LDLDIRECT 111.2 02/28/2013   LDLDIRECT 122.4 02/23/2012   LDLDIRECT 110.3 08/24/2011   HDL is high / LDL not at goal occ eats fatty foods  Does not want to take cholesterol medication at this time   Diabetes Home sugar results - usually in low 100s  DM diet - improved , occ eats sweets - no longer eats candy every day  Exercise -more lately  Symptoms-none  A1C last  Lab Results  Component Value Date   HGBA1C 6.6* 09/20/2014  this is down from 6.9  Diet controlled Renal protection on ace  Last eye exam 12/15-it was ok     Patient Active Problem List   Diagnosis Date Noted  . Hypokalemia 09/04/2013  . Nonspecific abnormal electrocardiogram (ECG) (EKG) 08/30/2013  . Encounter for Medicare annual wellness exam  03/07/2013  . Fullness of supraclavicular fossa 02/27/2011  . Other screening mammogram 02/25/2011  . Gynecological examination 02/25/2011  . Post-menopausal bleeding 02/25/2011  . CERVICAL RADICULOPATHY, LEFT 03/31/2010  . DM type 2 (diabetes mellitus, type 2) 07/06/2007  . FIBROIDS, UTERUS 03/23/2007  . HYPERCHOLESTEROLEMIA 03/23/2007  . Essential hypertension 03/23/2007  . POSTMENOPAUSAL STATUS 03/23/2007  . MURMUR 03/23/2007   Past Medical History  Diagnosis Date  . Hypertension   . 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 Hyperlipidemia   . Heart murmur   . Fibroids   . Gallstones     incidental asymptomatic gallstones   No past surgical history on file. Social History  Substance Use Topics  . Smoking status: Never Smoker   . Smokeless tobacco: None  . Alcohol Use: No   Family History  Problem Relation Age of Onset  . Hypertension Mother   . Diabetes Mother   . Osteoporosis Sister   . Diabetes Brother    Allergies  Allergen Reactions  . Ibuprofen     REACTION: palpitations  . Sulfamethoxazole-Trimethoprim     REACTION: rash   Current Outpatient Prescriptions on File Prior to Visit  Medication Sig Dispense Refill  . ACCU-CHEK AVIVA PLUS test  strip USE TO CHECK BLOOD SUGAR ONCE A DAY AS DIRECTED 100 each 1  . Blood Glucose Monitoring Suppl (ACCU-CHEK AVIVA PLUS) W/DEVICE KIT Check glucose once daily and as needed for DM 250.0     . cholecalciferol (VITAMIN D) 1000 UNITS tablet Take 1,000 Units by mouth daily.    . hydrochlorothiazide (HYDRODIURIL) 25 MG tablet Take 1 tablet (25 mg total) by mouth daily. In the am 30 tablet 11  . Lancets (ACCU-CHEK MULTICLIX) lancets Check glucose once daily and as needed for diabetes DM2 250.00 100 each 3  . lisinopril (PRINIVIL,ZESTRIL) 40 MG tablet Take 1 tablet (40 mg total) by mouth daily. 30 tablet 11  . Multiple Vitamin (MULTIVITAMIN) tablet Take 1 tablet by mouth daily.      . potassium chloride (K-DUR,KLOR-CON) 10 MEQ  tablet Take 1 tablet (10 mEq total) by mouth daily. 30 tablet 11   No current facility-administered medications on file prior to visit.    Review of Systems Review of Systems  Constitutional: Negative for fever, appetite change, fatigue and unexpected weight change.  Eyes: Negative for pain and visual disturbance.  Respiratory: Negative for cough and shortness of breath.   Cardiovascular: Negative for cp or palpitations    Gastrointestinal: Negative for nausea, diarrhea and constipation.  Genitourinary: Negative for urgency and frequency.  Skin: Negative for pallor or rash   Neurological: Negative for weakness, light-headedness, numbness and headaches.  Hematological: Negative for adenopathy. Does not bruise/bleed easily.  Psychiatric/Behavioral: Negative for dysphoric mood. The patient is not nervous/anxious.         Objective:   Physical Exam  Constitutional: She appears well-developed and well-nourished. No distress.  overwt and well app  HENT:  Head: Normocephalic and atraumatic.  Mouth/Throat: Oropharynx is clear and moist.  Eyes: Conjunctivae and EOM are normal. Pupils are equal, round, and reactive to light.  Neck: Normal range of motion. Neck supple. No JVD present. Carotid bruit is not present. No thyromegaly present.  Cardiovascular: Normal rate, regular rhythm and intact distal pulses.  Exam reveals no gallop.   Murmur heard. Pulmonary/Chest: Effort normal and breath sounds normal. No respiratory distress. She has no wheezes. She has no rales.  No crackles  Abdominal: Soft. Bowel sounds are normal. She exhibits no distension, no abdominal bruit and no mass. There is no tenderness.  Musculoskeletal: She exhibits no edema.  Lymphadenopathy:    She has no cervical adenopathy.  Neurological: She is alert. She has normal reflexes.  Skin: Skin is warm and dry. No rash noted.  Psychiatric: She has a normal mood and affect.          Assessment & Plan:   Problem List  Items Addressed This Visit    DM type 2 (diabetes mellitus, type 2)    Improved Lab Results  Component Value Date   HGBA1C 6.6* 09/20/2014   Diet control only  Rev goal for a low glycemic diet Commended exercise  F/u 6 mo       Essential hypertension - Primary    bp in fair control at this time  BP Readings from Last 1 Encounters:  10/02/14 134/74   No changes needed Disc lifstyle change with low sodium diet and exercise  Labs reviewed       HYPERCHOLESTEROLEMIA    Lipids are almost at goal and pt is resistant to medication  High HDL is good  LDL is 111- goal of less than 100  Given literature on diet -will work on lower fat  diet  F/u 71mo

## 2014-10-02 NOTE — Assessment & Plan Note (Signed)
Improved Lab Results  Component Value Date   HGBA1C 6.6* 09/20/2014   Diet control only  Rev goal for a low glycemic diet Commended exercise  F/u 6 mo

## 2014-10-02 NOTE — Assessment & Plan Note (Signed)
Lipids are almost at goal and pt is resistant to medication  High HDL is good  LDL is 111- goal of less than 100  Given literature on diet -will work on lower fat diet  F/u 21mo

## 2014-10-02 NOTE — Patient Instructions (Addendum)
To get cholesterol to goal- please watch your diet (Avoid red meat/ fried foods/ egg yolks/ fatty breakfast meats/ butter, cheese and high fat dairy/ and shellfish) For diabetes - avoid sweets and also watch portions of carbs (starches)  Blood pressure is stable  Keep up the exercise- it helps - when the garden is done try to find other exercise (walking when it is cooler)  Take care of yourself   Follow up in about 6 months for annual exam with labs prior

## 2014-10-02 NOTE — Assessment & Plan Note (Signed)
bp in fair control at this time  BP Readings from Last 1 Encounters:  10/02/14 134/74   No changes needed Disc lifstyle change with low sodium diet and exercise  Labs reviewed

## 2014-10-02 NOTE — Progress Notes (Signed)
Pre visit review using our clinic review tool, if applicable. No additional management support is needed unless otherwise documented below in the visit note. 

## 2014-12-13 ENCOUNTER — Other Ambulatory Visit: Payer: Self-pay | Admitting: Family Medicine

## 2015-03-11 ENCOUNTER — Other Ambulatory Visit: Payer: Self-pay | Admitting: Family Medicine

## 2015-03-24 ENCOUNTER — Telehealth: Payer: Self-pay | Admitting: Family Medicine

## 2015-03-24 DIAGNOSIS — E119 Type 2 diabetes mellitus without complications: Secondary | ICD-10-CM

## 2015-03-24 DIAGNOSIS — E7800 Pure hypercholesterolemia, unspecified: Secondary | ICD-10-CM

## 2015-03-24 DIAGNOSIS — I10 Essential (primary) hypertension: Secondary | ICD-10-CM

## 2015-03-24 DIAGNOSIS — E876 Hypokalemia: Secondary | ICD-10-CM

## 2015-03-24 NOTE — Telephone Encounter (Signed)
-----   Message from Ellamae Sia sent at 03/21/2015 11:18 AM EST ----- Regarding: Lab orders for Friday, 2.10.17 Patient is scheduled for CPX labs, please order future labs, Thanks , Karna Christmas

## 2015-03-25 ENCOUNTER — Other Ambulatory Visit: Payer: Self-pay | Admitting: Family Medicine

## 2015-03-29 ENCOUNTER — Other Ambulatory Visit (INDEPENDENT_AMBULATORY_CARE_PROVIDER_SITE_OTHER): Payer: Medicare Other

## 2015-03-29 DIAGNOSIS — E119 Type 2 diabetes mellitus without complications: Secondary | ICD-10-CM | POA: Diagnosis not present

## 2015-03-29 DIAGNOSIS — E876 Hypokalemia: Secondary | ICD-10-CM | POA: Diagnosis not present

## 2015-03-29 DIAGNOSIS — I10 Essential (primary) hypertension: Secondary | ICD-10-CM | POA: Diagnosis not present

## 2015-03-29 DIAGNOSIS — E7800 Pure hypercholesterolemia, unspecified: Secondary | ICD-10-CM | POA: Diagnosis not present

## 2015-03-29 LAB — COMPREHENSIVE METABOLIC PANEL
ALT: 13 U/L (ref 0–35)
AST: 19 U/L (ref 0–37)
Albumin: 4.2 g/dL (ref 3.5–5.2)
Alkaline Phosphatase: 70 U/L (ref 39–117)
BILIRUBIN TOTAL: 0.6 mg/dL (ref 0.2–1.2)
BUN: 11 mg/dL (ref 6–23)
CHLORIDE: 99 meq/L (ref 96–112)
CO2: 34 meq/L — AB (ref 19–32)
Calcium: 10.8 mg/dL — ABNORMAL HIGH (ref 8.4–10.5)
Creatinine, Ser: 1 mg/dL (ref 0.40–1.20)
GFR: 69.9 mL/min (ref >60.00)
GLUCOSE: 117 mg/dL — AB (ref 70–99)
Potassium: 3.5 mEq/L (ref 3.5–5.1)
Sodium: 139 mEq/L (ref 135–145)
Total Protein: 8.1 g/dL (ref 6.0–8.3)

## 2015-03-29 LAB — LIPID PANEL
CHOL/HDL RATIO: 3
Cholesterol: 213 mg/dL — ABNORMAL HIGH (ref 0–200)
HDL: 69.8 mg/dL (ref >39.00)
LDL CALC: 116 mg/dL — AB (ref 0–99)
NONHDL: 143.38
Triglycerides: 135 mg/dL (ref 0.0–149.0)
VLDL: 27 mg/dL (ref 0.0–40.0)

## 2015-03-29 LAB — TSH: TSH: 3.14 u[IU]/mL (ref 0.35–4.50)

## 2015-03-29 LAB — HEMOGLOBIN A1C: HEMOGLOBIN A1C: 6.7 % — AB (ref 4.6–6.5)

## 2015-03-30 LAB — CBC WITH DIFFERENTIAL/PLATELET
BASOS ABS: 0 10*3/uL (ref 0.0–0.1)
Basophils Relative: 0.4 % (ref 0.0–3.0)
Eosinophils Absolute: 0.2 10*3/uL (ref 0.0–0.7)
Eosinophils Relative: 2 % (ref 0.0–5.0)
HCT: 40.3 % (ref 36.0–46.0)
Hemoglobin: 13.1 g/dL (ref 12.0–15.0)
LYMPHS ABS: 2.9 10*3/uL (ref 0.7–4.0)
Lymphocytes Relative: 34.4 % (ref 12.0–46.0)
MCHC: 32.5 g/dL (ref 30.0–36.0)
MCV: 85.2 fl (ref 78.0–100.0)
MONO ABS: 0.7 10*3/uL (ref 0.1–1.0)
Monocytes Relative: 8.6 % (ref 3.0–12.0)
NEUTROS PCT: 54.6 % (ref 43.0–77.0)
Neutro Abs: 4.6 10*3/uL (ref 1.4–7.7)
PLATELETS: 319 10*3/uL (ref 150.0–400.0)
RBC: 4.73 Mil/uL (ref 3.87–5.11)
RDW: 14.6 % (ref 11.5–15.5)
WBC: 8.4 10*3/uL (ref 4.0–10.5)

## 2015-04-05 ENCOUNTER — Ambulatory Visit (INDEPENDENT_AMBULATORY_CARE_PROVIDER_SITE_OTHER): Payer: Medicare Other | Admitting: Family Medicine

## 2015-04-05 ENCOUNTER — Encounter: Payer: Self-pay | Admitting: Family Medicine

## 2015-04-05 VITALS — BP 125/65 | HR 73 | Temp 98.0°F | Ht 60.25 in | Wt 144.8 lb

## 2015-04-05 DIAGNOSIS — E2839 Other primary ovarian failure: Secondary | ICD-10-CM

## 2015-04-05 DIAGNOSIS — E119 Type 2 diabetes mellitus without complications: Secondary | ICD-10-CM | POA: Diagnosis not present

## 2015-04-05 DIAGNOSIS — I10 Essential (primary) hypertension: Secondary | ICD-10-CM | POA: Diagnosis not present

## 2015-04-05 DIAGNOSIS — E7800 Pure hypercholesterolemia, unspecified: Secondary | ICD-10-CM | POA: Diagnosis not present

## 2015-04-05 DIAGNOSIS — Z0000 Encounter for general adult medical examination without abnormal findings: Secondary | ICD-10-CM

## 2015-04-05 MED ORDER — POTASSIUM CHLORIDE CRYS ER 10 MEQ PO TBCR: 10.0000 meq | EXTENDED_RELEASE_TABLET | Freq: Every day | ORAL | Status: DC

## 2015-04-05 MED ORDER — HYDROCHLOROTHIAZIDE 25 MG PO TABS: 25.0000 mg | ORAL_TABLET | Freq: Every day | ORAL | Status: DC

## 2015-04-05 MED ORDER — LISINOPRIL 40 MG PO TABS: 40.0000 mg | ORAL_TABLET | Freq: Every day | ORAL | Status: DC

## 2015-04-05 NOTE — Patient Instructions (Signed)
Don't forget to schedule your annual mammogram at [facility] Stop at check out - for referral for a bone density test  Do work on Insurance account manager (the book we gave you)- living will and power of attorney Watch your diet for cholesterol (Avoid red meat/ fried foods/ egg yolks/ fatty breakfast meats/ butter, cheese and high fat dairy/ and shellfish ) We need to get your cholesterol down   Schedule follow up with labs prior in 3 mo

## 2015-04-05 NOTE — Progress Notes (Signed)
Subjective:    Patient ID: [name], female    DOB: [birth year], 75 y.o.   MRN: [id number]  HPI Here for annual medicare wellness visit as well as chronic/acute medical problems as well as annual preventative   I have personally reviewed the Medicare Annual Wellness questionnaire and have noted 1. The patient's medical and social history 2. Their use of alcohol, tobacco or illicit drugs 3. Their current medications and supplements 4. The patient's functional ability including ADL's, fall risks, home safety risks and hearing or visual             impairment. 5. Diet and physical activities 6. Evidence for depression or mood disorders  The patients weight, height, BMI have been recorded in the chart and visual acuity is per eye clinic.  I have made referrals, counseling and provided education to the patient based review of the above and I have provided the pt with a written personalized care plan for preventive services. Reviewed and updated provider list, see scanned forms.  See scanned forms.  Routine anticipatory guidance given to patient.  See health maintenance. Colon cancer screening -colonoscopy 5/11 - with poor prep - recall (pt thinks) is 6-7 year  Breast cancer screening 3/16 nl -she will schedule that at [place] breast exam-no lumps  Flu vaccine 9/16 Tetanus vaccine 5/15 Pneumovax 2/16- complete on both  Zoster vaccine 4/16 (made her arm swell) dexa nl 9/03 - is interested in that  Takes vit D and mvi  Some exercise - walking  No falls or fractures  Advance directive -does not have a living will or POA  Cognitive function addressed- see scanned forms- and if abnormal then additional documentation follows. No concerns about -memory is very good   PMH and SH reviewed  Meds, vitals, and allergies reviewed.   ROS: See HPI.  Otherwise negative.    bp is stable today - she was rushing  No cp or palpitations or headaches or edema  No side effects to medicines    BP Readings from Last 3 Encounters:  04/05/15 146/72  10/02/14 134/74  06/12/14 132/74      DM Lab Results  Component Value Date   HGBA1C 6.7* 03/29/2015  was 6.6  Overall stable  Controlled with diet  Tries to eat very healthy - occ has a sweet binge - she usually controls herself   Cholesterol Lab Results  Component Value Date   CHOL 213* 03/29/2015   CHOL 205* 09/20/2014   CHOL 200 03/02/2014   Lab Results  Component Value Date   HDL 69.80 03/29/2015   HDL 69.50 09/20/2014   HDL 68.80 03/02/2014   Lab Results  Component Value Date   LDLCALC 116* 03/29/2015   LDLCALC 111* 09/20/2014   LDLCALC 106* 03/02/2014   Lab Results  Component Value Date   TRIG 135.0 03/29/2015   TRIG 123.0 09/20/2014   TRIG 126.0 03/02/2014   Lab Results  Component Value Date   CHOLHDL 3 03/29/2015   CHOLHDL 3 09/20/2014   CHOLHDL 3 03/02/2014   Lab Results  Component Value Date   LDLDIRECT 111.2 02/28/2013   LDLDIRECT 122.4 02/23/2012   LDLDIRECT 110.3 08/24/2011   LDL is not at goal HDL is very good  Sometimes eats fatty foods  Avoids red meat  occ fried foods  Declines cholesterol med -will watch diet   Will re check in 3 mo   Patient Active Problem List   Diagnosis Date Noted  . Routine general  medical examination at a health care facility 04/05/2015  . Estrogen deficiency 04/05/2015  . Hypokalemia 09/04/2013  . Nonspecific abnormal electrocardiogram (ECG) (EKG) 08/30/2013  . Encounter for Medicare annual wellness exam 03/07/2013  . Fullness of supraclavicular fossa 02/27/2011  . Other screening mammogram 02/25/2011  . Gynecological examination 02/25/2011  . Post-menopausal bleeding 02/25/2011  . CERVICAL RADICULOPATHY, LEFT 03/31/2010  . DM type 2 (diabetes mellitus, type 2) ([place]) 07/06/2007  . FIBROIDS, UTERUS 03/23/2007  . HYPERCHOLESTEROLEMIA 03/23/2007  . Essential hypertension 03/23/2007  . POSTMENOPAUSAL STATUS 03/23/2007  . MURMUR 03/23/2007   Past  Medical History  Diagnosis Date  . Hypertension   . 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 Hyperlipidemia   . Heart murmur   . Fibroids   . Gallstones     incidental asymptomatic gallstones   No past surgical history on file. Social History  Substance Use Topics  . Smoking status: Never Smoker   . Smokeless tobacco: None  . Alcohol Use: No   Family History  Problem Relation Age of Onset  . Hypertension Mother   . Diabetes Mother   . Osteoporosis Sister   . Diabetes Brother    Allergies  Allergen Reactions  . Ibuprofen     REACTION: palpitations  . Sulfamethoxazole-Trimethoprim     REACTION: rash   Current Outpatient Prescriptions on File Prior to Visit  Medication Sig Dispense Refill  . Blood Glucose Monitoring Suppl (ACCU-CHEK AVIVA PLUS) W/DEVICE KIT Check glucose once daily and as needed for DM 250.0     . cholecalciferol (VITAMIN D) 1000 UNITS tablet Take 1,000 Units by mouth daily.    Marland Kitchen glucose blood (ACCU-CHEK AVIVA PLUS) test strip USE AS DIRECTED TO CHECK BLOOD SUGAR ONCE A DAY FOR DM (DX. CODE E11.9) 100 each 0  . Lancets (ACCU-CHEK MULTICLIX) lancets Check glucose once daily and as needed for diabetes DM2 250.00 100 each 3  . Multiple Vitamin (MULTIVITAMIN) tablet Take 1 tablet by mouth daily.       No current facility-administered medications on file prior to visit.    Review of Systems Review of Systems  Constitutional: Negative for fever, appetite change, fatigue and unexpected weight change.  Eyes: Negative for pain and visual disturbance.  Respiratory: Negative for cough and shortness of breath.   Cardiovascular: Negative for cp or palpitations    Gastrointestinal: Negative for nausea, diarrhea and constipation.  Genitourinary: Negative for urgency and frequency.  Skin: Negative for pallor or rash   Neurological: Negative for weakness, light-headedness, numbness and headaches.  Hematological: Negative for adenopathy. Does not bruise/bleed easily.    Psychiatric/Behavioral: Negative for dysphoric mood. The patient is not nervous/anxious.         Objective:   Physical Exam  Constitutional: She appears well-developed and well-nourished. No distress.  Well appearing   HENT:  Head: Normocephalic and atraumatic.  Right Ear: External ear normal.  Left Ear: External ear normal.  Mouth/Throat: Oropharynx is clear and moist.  Eyes: Conjunctivae and EOM are normal. Pupils are equal, round, and reactive to light. No scleral icterus.  Neck: Normal range of motion. Neck supple. No JVD present. Carotid bruit is not present. No thyromegaly present.  Cardiovascular: Normal rate, regular rhythm and intact distal pulses.  Exam reveals no gallop.   Murmur heard. Pulmonary/Chest: Effort normal and breath sounds normal. No respiratory distress. She has no wheezes. She exhibits no tenderness.  Abdominal: Soft. Bowel sounds are normal. She exhibits no distension, no abdominal bruit and  no mass. There is no tenderness.  Genitourinary: No breast swelling, tenderness, discharge or bleeding.  Breast exam: No mass, nodules, thickening, tenderness, bulging, retraction, inflamation, nipple discharge or skin changes noted.  No axillary or clavicular LA.      Musculoskeletal: Normal range of motion. She exhibits no edema or tenderness.  Lymphadenopathy:    She has no cervical adenopathy.  Neurological: She is alert. She has normal reflexes. No cranial nerve deficit. She exhibits normal muscle tone. Coordination normal.  Skin: Skin is warm and dry. No rash noted. No erythema. No pallor.  Psychiatric: She has a normal mood and affect.          Assessment & Plan:   Problem List Items Addressed This Visit      Cardiovascular and Mediastinum   Essential hypertension    bp in fair control at this time  BP Readings from Last 1 Encounters:  04/05/15 125/65   No changes needed Disc lifstyle change with low sodium diet and exercise  Labs reviewed        Relevant Medications   hydrochlorothiazide (HYDRODIURIL) 25 MG tablet   lisinopril (PRINIVIL,ZESTRIL) 40 MG tablet     Endocrine   DM type 2 (diabetes mellitus, type 2) ([place])    Lab Results  Component Value Date   HGBA1C 6.7* 03/29/2015   This is overall stable  Disc low glycemic diet  Continue to follow       Relevant Medications   lisinopril (PRINIVIL,ZESTRIL) 40 MG tablet     Other   Encounter for Medicare annual wellness exam - Primary    Reviewed health habits including diet and exercise and skin cancer prevention Reviewed appropriate screening tests for age  Also reviewed health mt list, fam hx and immunization status , as well as social and family history   See HPI Labs rev Don't forget to schedule your annual mammogram at [facility] Stop at check out - for referral for a bone density test  Do work on Insurance account manager (the book we gave you)- living will and power of attorney Watch your diet for cholesterol (Avoid red meat/ fried foods/ egg yolks/ fatty breakfast meats/ butter, cheese and high fat dairy/ and shellfish ) We need to get your cholesterol down   Schedule follow up with labs prior in 3 mo       Estrogen deficiency   Relevant Orders   DG Bone Density   HYPERCHOLESTEROLEMIA    Disc goals for lipids and reasons to control them Rev labs with pt Rev low sat fat diet in detail  Needs to work on habits Info given re: low sat fat diet  Re check 3 mo      Relevant Medications   hydrochlorothiazide (HYDRODIURIL) 25 MG tablet   lisinopril (PRINIVIL,ZESTRIL) 40 MG tablet   Routine general medical examination at a health care facility    Also reviewed health mt list, fam hx and immunization status , as well as social and family history   See HPI Labs rev Don't forget to schedule your annual mammogram at [facility] Stop at check out - for referral for a bone density test  Do work on Insurance account manager (the book we gave you)- living will and power of  attorney Watch your diet for cholesterol (Avoid red meat/ fried foods/ egg yolks/ fatty breakfast meats/ butter, cheese and high fat dairy/ and shellfish ) We need to get your cholesterol down   Schedule follow up with labs prior in 3  mo

## 2015-04-05 NOTE — Progress Notes (Signed)
Pre visit review using our clinic review tool, if applicable. No additional management support is needed unless otherwise documented below in the visit note. 

## 2015-04-07 NOTE — Assessment & Plan Note (Signed)
Disc goals for lipids and reasons to control them Rev labs with pt Rev low sat fat diet in detail  Needs to work on habits Info given re: low sat fat diet  Re check 3 mo

## 2015-04-07 NOTE — Assessment & Plan Note (Signed)
Lab Results  Component Value Date   HGBA1C 6.7* 03/29/2015   This is overall stable  Disc low glycemic diet  Continue to follow

## 2015-04-07 NOTE — Assessment & Plan Note (Signed)
Reviewed health habits including diet and exercise and skin cancer prevention Reviewed appropriate screening tests for age  Also reviewed health mt list, fam hx and immunization status , as well as social and family history   See HPI Labs rev Don't forget to schedule your annual mammogram at [facility] Stop at check out - for referral for a bone density test  Do work on Insurance account manager (the book we gave you)- living will and power of attorney Watch your diet for cholesterol (Avoid red meat/ fried foods/ egg yolks/ fatty breakfast meats/ butter, cheese and high fat dairy/ and shellfish ) We need to get your cholesterol down   Schedule follow up with labs prior in 3 mo

## 2015-04-07 NOTE — Assessment & Plan Note (Signed)
Also reviewed health mt list, fam hx and immunization status , as well as social and family history   See HPI Labs rev Don't forget to schedule your annual mammogram at [facility] Stop at check out - for referral for a bone density test  Do work on Insurance account manager (the book we gave you)- living will and power of attorney Watch your diet for cholesterol (Avoid red meat/ fried foods/ egg yolks/ fatty breakfast meats/ butter, cheese and high fat dairy/ and shellfish ) We need to get your cholesterol down   Schedule follow up with labs prior in 3 mo

## 2015-04-07 NOTE — Assessment & Plan Note (Signed)
bp in fair control at this time  BP Readings from Last 1 Encounters:  04/05/15 125/65   No changes needed Disc lifstyle change with low sodium diet and exercise  Labs reviewed

## 2015-04-24 ENCOUNTER — Inpatient Hospital Stay: Admission: RE | Admit: 2015-04-24 | Payer: Self-pay | Source: Ambulatory Visit

## 2015-04-30 ENCOUNTER — Ambulatory Visit (INDEPENDENT_AMBULATORY_CARE_PROVIDER_SITE_OTHER)
Admission: RE | Admit: 2015-04-30 | Discharge: 2015-04-30 | Disposition: A | Discharge status: Home / Self Care | Payer: Medicare Other | Source: Ambulatory Visit | Attending: Family Medicine | Admitting: Family Medicine

## 2015-04-30 DIAGNOSIS — E2839 Other primary ovarian failure: Secondary | ICD-10-CM

## 2015-05-10 ENCOUNTER — Encounter: Payer: Self-pay | Admitting: Family Medicine

## 2015-05-10 DIAGNOSIS — Z1231 Encounter for screening mammogram for malignant neoplasm of breast: Secondary | ICD-10-CM | POA: Diagnosis not present

## 2015-07-01 ENCOUNTER — Other Ambulatory Visit: Payer: Self-pay | Admitting: Family Medicine

## 2015-07-01 ENCOUNTER — Other Ambulatory Visit (INDEPENDENT_AMBULATORY_CARE_PROVIDER_SITE_OTHER): Payer: Medicare Other

## 2015-07-01 DIAGNOSIS — E7800 Pure hypercholesterolemia, unspecified: Secondary | ICD-10-CM | POA: Diagnosis not present

## 2015-07-01 DIAGNOSIS — E119 Type 2 diabetes mellitus without complications: Secondary | ICD-10-CM | POA: Diagnosis not present

## 2015-07-01 LAB — LIPID PANEL
Cholesterol: 209 mg/dL — ABNORMAL HIGH (ref 0–200)
HDL: 67.2 mg/dL (ref >39.00)
LDL Cholesterol: 129 mg/dL — ABNORMAL HIGH (ref 0–99)
NONHDL: 141.31
Total CHOL/HDL Ratio: 3
Triglycerides: 64 mg/dL (ref 0.0–149.0)
VLDL: 12.8 mg/dL (ref 0.0–40.0)

## 2015-07-01 LAB — HEMOGLOBIN A1C: HEMOGLOBIN A1C: 6.7 % — AB (ref 4.6–6.5)

## 2015-07-05 ENCOUNTER — Encounter: Payer: Self-pay | Admitting: Family Medicine

## 2015-07-05 ENCOUNTER — Ambulatory Visit (INDEPENDENT_AMBULATORY_CARE_PROVIDER_SITE_OTHER): Payer: Medicare Other | Admitting: Family Medicine

## 2015-07-05 VITALS — BP 122/74 | HR 69 | Temp 97.7°F | Ht 60.25 in | Wt 144.5 lb

## 2015-07-05 DIAGNOSIS — E7800 Pure hypercholesterolemia, unspecified: Secondary | ICD-10-CM

## 2015-07-05 DIAGNOSIS — I10 Essential (primary) hypertension: Secondary | ICD-10-CM

## 2015-07-05 DIAGNOSIS — E119 Type 2 diabetes mellitus without complications: Secondary | ICD-10-CM | POA: Diagnosis not present

## 2015-07-05 MED ORDER — ATORVASTATIN CALCIUM 10 MG PO TABS: 10.0000 mg | ORAL_TABLET | Freq: Every day | ORAL | Status: DC

## 2015-07-05 NOTE — Assessment & Plan Note (Signed)
Stable with diet and exercise  Enc her to keep walking Lab Results  Component Value Date   HGBA1C 6.7* 07/01/2015   This is the same

## 2015-07-05 NOTE — Assessment & Plan Note (Signed)
bp in fair control at this time  BP Readings from Last 1 Encounters:  07/05/15 122/74   No changes needed Disc lifstyle change with low sodium diet and exercise  Labs reviewed

## 2015-07-05 NOTE — Assessment & Plan Note (Signed)
LDL still not at goal of 100 or less Pt states she does not think she can change diet (enc her to minimize fried food and cheese) Disc goals for lipids and reasons to control them Rev labs with pt Rev low sat fat diet in detail Start atorvastatin 10 mg daily in eve-update if side eff Re check 6 wk lipid prof

## 2015-07-05 NOTE — Patient Instructions (Addendum)
Start atorvastatin 10 mg daily in the evening  If any side effects or problems please let me know  Eat a low fat diet    Avoid red meat/ fried foods/ egg yolks/ fatty breakfast meats/ butter, cheese and high fat dairy/ and shellfish   Schedule fasting labs in 6 weeks  Continue your vitamin D for bone health

## 2015-07-05 NOTE — Progress Notes (Signed)
Subjective:    Patient ID: [name], female    DOB: November 03, 1940, 75 y.o.   MRN: [id number]  HPI Here for f/u of chronic health problems   Wt is stable bmi of 28  DM2 -  Lab Results  Component Value Date   HGBA1C 6.7* 07/01/2015   No change at all  No high or low glucose  Has been trying to eat a healthier diet  Cholesterol Lab Results  Component Value Date   CHOL 209* 07/01/2015   CHOL 213* 03/29/2015   CHOL 205* 09/20/2014   Lab Results  Component Value Date   HDL 67.20 07/01/2015   HDL 69.80 03/29/2015   HDL 69.50 09/20/2014   Lab Results  Component Value Date   LDLCALC 129* 07/01/2015   LDLCALC 116* 03/29/2015   LDLCALC 111* 09/20/2014   Lab Results  Component Value Date   TRIG 64.0 07/01/2015   TRIG 135.0 03/29/2015   TRIG 123.0 09/20/2014   Lab Results  Component Value Date   CHOLHDL 3 07/01/2015   CHOLHDL 3 03/29/2015   CHOLHDL 3 09/20/2014   Lab Results  Component Value Date   LDLDIRECT 111.2 02/28/2013   LDLDIRECT 122.4 02/23/2012   LDLDIRECT 110.3 08/24/2011   Has never been on cholesterol med Dec her ice cream  Some fried foods - she eats fried chicken from [facility] at least 2 times per week  Not a lot of red meat  Loves cheese   Does not think it is realistic to change in diet at this time  Is open to a small dose    Patient Active Problem List   Diagnosis Date Noted  . Routine general medical examination at a health care facility 04/05/2015  . Estrogen deficiency 04/05/2015  . Hypokalemia 09/04/2013  . Nonspecific abnormal electrocardiogram (ECG) (EKG) 08/30/2013  . Encounter for Medicare annual wellness exam 03/07/2013  . Fullness of supraclavicular fossa 02/27/2011  . Other screening mammogram 02/25/2011  . Gynecological examination 02/25/2011  . Post-menopausal bleeding 02/25/2011  . CERVICAL RADICULOPATHY, LEFT 03/31/2010  . DM type 2 (diabetes mellitus, type 2) ([place]) 07/06/2007  . FIBROIDS, UTERUS 03/23/2007  .  HYPERCHOLESTEROLEMIA 03/23/2007  . Essential hypertension 03/23/2007  . POSTMENOPAUSAL STATUS 03/23/2007  . MURMUR 03/23/2007   Past Medical History  Diagnosis Date  . Hypertension   . 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 Hyperlipidemia   . Heart murmur   . Fibroids   . Gallstones     incidental asymptomatic gallstones   No past surgical history on file. Social History  Substance Use Topics  . Smoking status: Never Smoker   . Smokeless tobacco: None  . Alcohol Use: No   Family History  Problem Relation Age of Onset  . Hypertension Mother   . Diabetes Mother   . Osteoporosis Sister   . Diabetes Brother    Allergies  Allergen Reactions  . Ibuprofen     REACTION: palpitations  . Sulfamethoxazole-Trimethoprim     REACTION: rash   Current Outpatient Prescriptions on File Prior to Visit  Medication Sig Dispense Refill  . ACCU-CHEK AVIVA PLUS test strip USE AS DIRECTED TO CHECK BLOOD SUGAR ONCE A DAY. 100 each 1  . Blood Glucose Monitoring Suppl (ACCU-CHEK AVIVA PLUS) W/DEVICE KIT Check glucose once daily and as needed for DM 250.0     . cholecalciferol (VITAMIN D) 1000 UNITS tablet Take 1,000 Units by mouth daily.    . hydrochlorothiazide (HYDRODIURIL) 25 MG tablet Take  1 tablet (25 mg total) by mouth daily. In the am 90 tablet 3  . Lancets (ACCU-CHEK MULTICLIX) lancets Check glucose once daily and as needed for diabetes DM2 250.00 100 each 3  . lisinopril (PRINIVIL,ZESTRIL) 40 MG tablet Take 1 tablet (40 mg total) by mouth daily. 90 tablet 3  . Multiple Vitamin (MULTIVITAMIN) tablet Take 1 tablet by mouth daily.      . potassium chloride (K-DUR,KLOR-CON) 10 MEQ tablet Take 1 tablet (10 mEq total) by mouth daily. 90 tablet 3   No current facility-administered medications on file prior to visit.    Review of Systems Review of Systems  Constitutional: Negative for fever, appetite change, fatigue and unexpected weight change.  Eyes: Negative for pain and visual  disturbance.  Respiratory: Negative for cough and shortness of breath.   Cardiovascular: Negative for cp or palpitations    Gastrointestinal: Negative for nausea, diarrhea and constipation.  Genitourinary: Negative for urgency and frequency.  Skin: Negative for pallor or rash   Neurological: Negative for weakness, light-headedness, numbness and headaches.  Hematological: Negative for adenopathy. Does not bruise/bleed easily.  Psychiatric/Behavioral: Negative for dysphoric mood. The patient is not nervous/anxious.         Objective:   Physical Exam  Constitutional: She appears well-developed and well-nourished. No distress.  Well appearing   HENT:  Head: Normocephalic and atraumatic.  Mouth/Throat: Oropharynx is clear and moist.  Eyes: Conjunctivae and EOM are normal. Pupils are equal, round, and reactive to light.  Neck: Normal range of motion. Neck supple. No JVD present. Carotid bruit is not present. No thyromegaly present.  Cardiovascular: Normal rate, regular rhythm and intact distal pulses.  Exam reveals no gallop.   Murmur heard. Pulmonary/Chest: Effort normal and breath sounds normal. No respiratory distress. She has no wheezes. She has no rales.  No crackles  Abdominal: Soft. Bowel sounds are normal. She exhibits no distension, no abdominal bruit and no mass. There is no tenderness.  Musculoskeletal: She exhibits no edema.  Lymphadenopathy:    She has no cervical adenopathy.  Neurological: She is alert. She has normal reflexes.  Skin: Skin is warm and dry. No rash noted.  Psychiatric: She has a normal mood and affect.          Assessment & Plan:   Problem List Items Addressed This Visit      Cardiovascular and Mediastinum   Essential hypertension - Primary    bp in fair control at this time  BP Readings from Last 1 Encounters:  07/05/15 122/74   No changes needed Disc lifstyle change with low sodium diet and exercise  Labs reviewed       Relevant  Medications   aspirin 81 MG tablet   atorvastatin (LIPITOR) 10 MG tablet     Endocrine   DM type 2 (diabetes mellitus, type 2) ([place])    Stable with diet and exercise  Enc her to keep walking Lab Results  Component Value Date   HGBA1C 6.7* 07/01/2015   This is the same       Relevant Medications   aspirin 81 MG tablet   atorvastatin (LIPITOR) 10 MG tablet     Other   HYPERCHOLESTEROLEMIA    LDL still not at goal of 100 or less Pt states she does not think she can change diet (enc her to minimize fried food and cheese) Disc goals for lipids and reasons to control them Rev labs with pt Rev low sat fat diet in detail Start atorvastatin  10 mg daily in eve-update if side eff Re check 6 wk lipid prof      Relevant Medications   aspirin 81 MG tablet   atorvastatin (LIPITOR) 10 MG tablet

## 2015-07-05 NOTE — Progress Notes (Signed)
Pre visit review using our clinic review tool, if applicable. No additional management support is needed unless otherwise documented below in the visit note. 

## 2015-08-16 ENCOUNTER — Other Ambulatory Visit (INDEPENDENT_AMBULATORY_CARE_PROVIDER_SITE_OTHER): Payer: Medicare Other

## 2015-08-16 DIAGNOSIS — E7800 Pure hypercholesterolemia, unspecified: Secondary | ICD-10-CM | POA: Diagnosis not present

## 2015-08-16 LAB — LIPID PANEL
CHOL/HDL RATIO: 2
Cholesterol: 165 mg/dL (ref 0–200)
HDL: 73.6 mg/dL (ref >39.00)
LDL CALC: 74 mg/dL (ref 0–99)
NONHDL: 91.2
Triglycerides: 86 mg/dL (ref 0.0–149.0)
VLDL: 17.2 mg/dL (ref 0.0–40.0)

## 2015-08-16 LAB — ALT: ALT: 15 U/L (ref 0–35)

## 2015-08-16 LAB — AST: AST: 21 U/L (ref 0–37)

## 2015-09-11 ENCOUNTER — Telehealth: Payer: Self-pay | Admitting: Family Medicine

## 2015-09-11 NOTE — Telephone Encounter (Signed)
[facility] Patient Name: [name] DOB: [birth year] Initial Comment has pain in right leg and hip, hurts when she moves Nurse Assessment Nurse: Markus Daft, RN, Sherre Poot Date/Time (Eastern Time): 09/11/2015 11:47:18 AM Confirm and document reason for call. If symptomatic, describe symptoms. You must click the next button to save text entered. ---Caller states that she has pain in right leg and hip, and hurts when she moves. Started this weekend. Took Tylenol helped some. No injury. Pain is not in back at all. Has the patient traveled out of the country within the last 30 days? ---Not Applicable Does the patient have any new or worsening symptoms? ---Yes Will a triage be completed? ---Yes Related visit to physician within the last 2 weeks? ---No Does the PT have any chronic conditions? (i.e. diabetes, asthma, etc.) ---Yes List chronic conditions. ---pre DM, HTN Is this a behavioral health or substance abuse call? ---No Guidelines Guideline Title Affirmed Question Affirmed Notes Hip Pain [1] MODERATE pain (e.g., interferes with normal activities, limping) AND [2] present > 3 days Final Disposition User See PCP When Office is Open (within 3 days) Markus Daft, RN, [place]y Comments Appt set with PCP at 10:15 am on Friday, 09/13/15. Referrals REFERRED TO PCP OFFICE Disagree/Comply: Comply

## 2015-09-11 NOTE — Telephone Encounter (Signed)
Pt has appt with Dr Glori Bickers 09/13/15.

## 2015-09-12 NOTE — Telephone Encounter (Signed)
Aware, will see her then 

## 2015-09-13 ENCOUNTER — Ambulatory Visit (INDEPENDENT_AMBULATORY_CARE_PROVIDER_SITE_OTHER): Payer: Medicare Other | Admitting: Family Medicine

## 2015-09-13 ENCOUNTER — Encounter: Payer: Self-pay | Admitting: Family Medicine

## 2015-09-13 VITALS — BP 144/72 | HR 83 | Temp 98.1°F | Ht 60.25 in | Wt 146.5 lb

## 2015-09-13 DIAGNOSIS — M79604 Pain in right leg: Secondary | ICD-10-CM | POA: Diagnosis not present

## 2015-09-13 MED ORDER — CYCLOBENZAPRINE HCL 10 MG PO TABS: 5.0000 mg | ORAL_TABLET | Freq: Every evening | ORAL | 0 refills | Status: DC | PRN

## 2015-09-13 NOTE — Assessment & Plan Note (Addendum)
Suspect troch bursitis and piriformis spasm by exam Rev some piriformis stretches  Disc use of ice on trochanter area and heat on muscle  Handouts given  Cannot take nsaids-will continue tylenol Given flexeril for bedtime with caution of sedation  Update if no imp in 1-2 wk

## 2015-09-13 NOTE — Patient Instructions (Signed)
Keep moving - but slowly if needed Try heat on buttock (pirformis muscle spasm)  Try ice on the side of hip (to calm down bursitis) Continue tylenol as needed  Try flexeril (muscle relaxer) at night to help relax muscles  If you do not continue to improve please let me know

## 2015-09-13 NOTE — Progress Notes (Signed)
Subjective:    Patient ID: [name], female    DOB: [birth year], 75 y.o.   MRN: [id number]  HPI Here for hip and leg pain  R side  Under buttocks and pain radiates down  achey- worse when she tries to move   Getting up from a seated position  Catches sometimes when walking  She keeps it moving even if it hurts   Took some tylenol - and it did help  A whole lot better than Wednesday   Started after the weekend- perhaps she overdid it (on feet/cooking /working/some lifting) Worsened on Wednesday   No numbness  No weakness  No bowel or bladder changes   Patient Active Problem List   Diagnosis Date Noted  . Leg pain, right 09/13/2015  . Routine general medical examination at a health care facility 04/05/2015  . Estrogen deficiency 04/05/2015  . Hypokalemia 09/04/2013  . Nonspecific abnormal electrocardiogram (ECG) (EKG) 08/30/2013  . Encounter for Medicare annual wellness exam 03/07/2013  . Fullness of supraclavicular fossa 02/27/2011  . Other screening mammogram 02/25/2011  . Gynecological examination 02/25/2011  . Post-menopausal bleeding 02/25/2011  . CERVICAL RADICULOPATHY, LEFT 03/31/2010  . DM type 2 (diabetes mellitus, type 2) ([place]) 07/06/2007  . FIBROIDS, UTERUS 03/23/2007  . HYPERCHOLESTEROLEMIA 03/23/2007  . Essential hypertension 03/23/2007  . POSTMENOPAUSAL STATUS 03/23/2007  . MURMUR 03/23/2007   Past Medical History:  Diagnosis Date  . 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 Fibroids   . Gallstones    incidental asymptomatic gallstones  . Heart murmur   . Hyperlipidemia   . Hypertension    No past surgical history on file. Social History  Substance Use Topics  . Smoking status: Never Smoker  . Smokeless tobacco: Never Used  . Alcohol use No   Family History  Problem Relation Age of Onset  . Hypertension Mother   . Diabetes Mother   . Osteoporosis Sister   . Diabetes Brother    Allergies  Allergen Reactions  . Ibuprofen    REACTION: palpitations  . Sulfamethoxazole-Trimethoprim     REACTION: rash   Current Outpatient Prescriptions on File Prior to Visit  Medication Sig Dispense Refill  . ACCU-CHEK AVIVA PLUS test strip USE AS DIRECTED TO CHECK BLOOD SUGAR ONCE A DAY. 100 each 1  . aspirin 81 MG tablet Take 81 mg by mouth daily.    Marland Kitchen atorvastatin (LIPITOR) 10 MG tablet Take 1 tablet (10 mg total) by mouth daily. In evening 30 tablet 11  . Blood Glucose Monitoring Suppl (ACCU-CHEK AVIVA PLUS) W/DEVICE KIT Check glucose once daily and as needed for DM 250.0     . cholecalciferol (VITAMIN D) 1000 UNITS tablet Take 1,000 Units by mouth daily.    . hydrochlorothiazide (HYDRODIURIL) 25 MG tablet Take 1 tablet (25 mg total) by mouth daily. In the am 90 tablet 3  . Lancets (ACCU-CHEK MULTICLIX) lancets Check glucose once daily and as needed for diabetes DM2 250.00 100 each 3  . lisinopril (PRINIVIL,ZESTRIL) 40 MG tablet Take 1 tablet (40 mg total) by mouth daily. 90 tablet 3  . Multiple Vitamin (MULTIVITAMIN) tablet Take 1 tablet by mouth daily.      . potassium chloride (K-DUR,KLOR-CON) 10 MEQ tablet Take 1 tablet (10 mEq total) by mouth daily. 90 tablet 3   No current facility-administered medications on file prior to visit.     Review of Systems Review of Systems  Constitutional: Negative for fever, appetite change, fatigue and unexpected weight  change.  Eyes: Negative for pain and visual disturbance.  Respiratory: Negative for cough and shortness of breath.   Cardiovascular: Negative for cp or palpitations    Gastrointestinal: Negative for nausea, diarrhea and constipation.  Genitourinary: Negative for urgency and frequency.  Skin: Negative for pallor or rash   MSK pos for R buttock and leg pain  Neurological: Negative for weakness, light-headedness, numbness and headaches.  Hematological: Negative for adenopathy. Does not bruise/bleed easily.  Psychiatric/Behavioral: Negative for dysphoric mood. The patient  is not nervous/anxious.         Objective:   Physical Exam  Constitutional: She appears well-developed and well-nourished. No distress.  Well appearing   HENT:  Head: Normocephalic and atraumatic.  Eyes: Conjunctivae and EOM are normal. Pupils are equal, round, and reactive to light. No scleral icterus.  Neck: Normal range of motion. Neck supple.  Cardiovascular: Normal rate and regular rhythm.   Pulmonary/Chest: Effort normal and breath sounds normal. She has no wheezes. She has no rales.  Abdominal: Soft. Bowel sounds are normal. She exhibits no distension. There is no tenderness.  Musculoskeletal: She exhibits tenderness.       Right shoulder: She exhibits decreased range of motion, tenderness and spasm. She exhibits no bony tenderness, no swelling, no effusion, no crepitus, no deformity, normal pulse and normal strength.       Lumbar back: She exhibits decreased range of motion, tenderness and spasm. She exhibits no bony tenderness and no edema.  R LE- tender over piriformis muscle and also greater trochanter Pain with ext rot of hip (crossing legs)  SLR pos for upper leg pain  No LS tenderness (some loss of lordosis)  Lymphadenopathy:    She has no cervical adenopathy.  Neurological: She is alert. She has normal strength and normal reflexes. She displays no atrophy. No cranial nerve deficit or sensory deficit. She exhibits normal muscle tone. Coordination normal.  Skin: Skin is warm and dry. No rash noted. No erythema. No pallor.  Psychiatric: She has a normal mood and affect.          Assessment & Plan:   Problem List Items Addressed This Visit      Other   Leg pain, right    Suspect troch bursitis and piriformis spasm by exam Rev some piriformis stretches  Disc use of ice on trochanter area and heat on muscle  Handouts given  Cannot take nsaids-will continue tylenol Given flexeril for bedtime with caution of sedation  Update if no imp in 1-2 wk        Other  Visit Diagnoses   None.

## 2015-09-13 NOTE — Progress Notes (Signed)
Pre visit review using our clinic review tool, if applicable. No additional management support is needed unless otherwise documented below in the visit note. 

## 2015-09-27 DIAGNOSIS — E119 Type 2 diabetes mellitus without complications: Secondary | ICD-10-CM | POA: Diagnosis not present

## 2015-09-27 LAB — HM DIABETES EYE EXAM

## 2015-12-05 DIAGNOSIS — S7001XA Contusion of right hip, initial encounter: Secondary | ICD-10-CM | POA: Diagnosis not present

## 2016-03-03 ENCOUNTER — Ambulatory Visit: Payer: Self-pay | Admitting: Family Medicine

## 2016-03-06 ENCOUNTER — Ambulatory Visit: Payer: Self-pay | Admitting: Family Medicine

## 2016-03-09 ENCOUNTER — Ambulatory Visit (INDEPENDENT_AMBULATORY_CARE_PROVIDER_SITE_OTHER): Payer: Medicare Other | Admitting: Family Medicine

## 2016-03-09 ENCOUNTER — Encounter: Payer: Self-pay | Admitting: Family Medicine

## 2016-03-09 ENCOUNTER — Ambulatory Visit (INDEPENDENT_AMBULATORY_CARE_PROVIDER_SITE_OTHER)
Admission: RE | Admit: 2016-03-09 | Discharge: 2016-03-09 | Disposition: A | Discharge status: Home / Self Care | Payer: Medicare Other | Source: Ambulatory Visit | Attending: Family Medicine | Admitting: Family Medicine

## 2016-03-09 VITALS — BP 148/86 | HR 86 | Temp 98.1°F | Ht 60.25 in | Wt 140.5 lb

## 2016-03-09 DIAGNOSIS — I10 Essential (primary) hypertension: Secondary | ICD-10-CM

## 2016-03-09 DIAGNOSIS — M25511 Pain in right shoulder: Secondary | ICD-10-CM | POA: Insufficient documentation

## 2016-03-09 MED ORDER — CYCLOBENZAPRINE HCL 10 MG PO TABS: 5.0000 mg | ORAL_TABLET | Freq: Three times a day (TID) | ORAL | 1 refills | Status: DC | PRN

## 2016-03-09 NOTE — Progress Notes (Signed)
Pre visit review using our clinic review tool, if applicable. No additional management support is needed unless otherwise documented below in the visit note. 

## 2016-03-09 NOTE — Assessment & Plan Note (Signed)
bp is up today since pt has not yet taken medicine

## 2016-03-09 NOTE — Patient Instructions (Signed)
I think you strained something in your shoulder  Use ice /cold compress for 10 minutes as often as you can  Do the finger crawl up the wall exercise to keep shoulder from locking up  Try the flexeril as needed Also tylenol (acetaminophen) as needed for pain   Xray now  We should get results before the end of the day

## 2016-03-09 NOTE — Assessment & Plan Note (Signed)
Acute starting Tuesday w/no reported change in activity Hurts laterally/ superiorly and pain worse to abduct Suspect strain or tendonitis Xray today to r/o bony abn or arthritis Given flexeril  She cannot take nsaids  Prn acetaminophen is ok  inst to use ice for 10 min intervals Passive rom exercise shown to prevent frozen shoulder   Further plan with xray result

## 2016-03-09 NOTE — Progress Notes (Signed)
Subjective:    Patient ID: [name], female    DOB: [birth year], 76 y.o.   MRN: [id number]  HPI Here for R arm pain since last Tuesday   No trauma or injury known  At the base of neck and also lateral shoulder No swelling or redness or rash  achey pain - hurts all time   Worse with she abducts or reaches over her head  Most comfortable - perhaps sleeping on her back   No numbness or weakness in her arm  Has used a topical rub- for pain / ran out of it / ? Brand - helped little Tylenol eased it a while  Cannot take ibuprofen- gives her palpitations    bp is up today because she did not take her med this am yet  Patient Active Problem List   Diagnosis Date Noted  . Right shoulder pain 03/09/2016  . Leg pain, right 09/13/2015  . Routine general medical examination at a health care facility 04/05/2015  . Estrogen deficiency 04/05/2015  . Hypokalemia 09/04/2013  . Nonspecific abnormal electrocardiogram (ECG) (EKG) 08/30/2013  . Encounter for Medicare annual wellness exam 03/07/2013  . Fullness of supraclavicular fossa 02/27/2011  . Other screening mammogram 02/25/2011  . Gynecological examination 02/25/2011  . Post-menopausal bleeding 02/25/2011  . CERVICAL RADICULOPATHY, LEFT 03/31/2010  . DM type 2 (diabetes mellitus, type 2) ([place]) 07/06/2007  . FIBROIDS, UTERUS 03/23/2007  . HYPERCHOLESTEROLEMIA 03/23/2007  . Essential hypertension 03/23/2007  . POSTMENOPAUSAL STATUS 03/23/2007  . MURMUR 03/23/2007   Past Medical History:  Diagnosis Date  . 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 Fibroids   . Gallstones    incidental asymptomatic gallstones  . Heart murmur   . Hyperlipidemia   . Hypertension    No past surgical history on file. Social History  Substance Use Topics  . Smoking status: Never Smoker  . Smokeless tobacco: Never Used  . Alcohol use No   Family History  Problem Relation Age of Onset  . Hypertension Mother   . Diabetes Mother   .  Osteoporosis Sister   . Diabetes Brother    Allergies  Allergen Reactions  . Ibuprofen     REACTION: palpitations  . Sulfamethoxazole-Trimethoprim     REACTION: rash   Current Outpatient Prescriptions on File Prior to Visit  Medication Sig Dispense Refill  . ACCU-CHEK AVIVA PLUS test strip USE AS DIRECTED TO CHECK BLOOD SUGAR ONCE A DAY. 100 each 1  . aspirin 81 MG tablet Take 81 mg by mouth daily.    Marland Kitchen atorvastatin (LIPITOR) 10 MG tablet Take 1 tablet (10 mg total) by mouth daily. In evening 30 tablet 11  . Blood Glucose Monitoring Suppl (ACCU-CHEK AVIVA PLUS) W/DEVICE KIT Check glucose once daily and as needed for DM 250.0     . cholecalciferol (VITAMIN D) 1000 UNITS tablet Take 1,000 Units by mouth daily.    . hydrochlorothiazide (HYDRODIURIL) 25 MG tablet Take 1 tablet (25 mg total) by mouth daily. In the am 90 tablet 3  . Lancets (ACCU-CHEK MULTICLIX) lancets Check glucose once daily and as needed for diabetes DM2 250.00 100 each 3  . lisinopril (PRINIVIL,ZESTRIL) 40 MG tablet Take 1 tablet (40 mg total) by mouth daily. 90 tablet 3  . Multiple Vitamin (MULTIVITAMIN) tablet Take 1 tablet by mouth daily.      . potassium chloride (K-DUR,KLOR-CON) 10 MEQ tablet Take 1 tablet (10 mEq total) by mouth daily. 90 tablet 3   No  current facility-administered medications on file prior to visit.     Review of Systems Review of Systems  Constitutional: Negative for fever, appetite change, fatigue and unexpected weight change.   Pos for not taking bp med today Eyes: Negative for pain and visual disturbance.  Respiratory: Negative for cough and shortness of breath.   Cardiovascular: Negative for cp or palpitations    Gastrointestinal: Negative for nausea, diarrhea and constipation.  Genitourinary: Negative for urgency and frequency.  Skin: Negative for pallor or rash   MSK pos for R shoulder pain w/o swelling  Neurological: Negative for weakness, light-headedness, numbness and headaches.    Hematological: Negative for adenopathy. Does not bruise/bleed easily.  Psychiatric/Behavioral: Negative for dysphoric mood. The patient is not nervous/anxious.         Objective:   Physical Exam  Constitutional: She appears well-developed and well-nourished. No distress.  Well appearing  Eyes: Conjunctivae and EOM are normal. Pupils are equal, round, and reactive to light.  Neck: Normal range of motion. Neck supple.  Nl rom of neck  Some increased shoulder pain with full flex and bilat neck tilt (mild)  Musculoskeletal:       Right shoulder: She exhibits decreased range of motion, tenderness, bony tenderness and spasm. She exhibits no swelling, no effusion, no crepitus, no deformity, normal pulse and normal strength.  R shoulder: tender over biceps tendon/ acromion and trapezius  No cervical tenderness rom- cannot abduct past 90 deg and int/ext rotation limited by pain  No crepitus or skin change  Pos [facility] tests for pain   No neuro changes   Lymphadenopathy:    She has no cervical adenopathy.  Neurological: She displays no atrophy. No sensory deficit. She exhibits normal muscle tone.  Skin: Skin is warm and dry. No rash noted. No erythema.  Psychiatric: She has a normal mood and affect.          Assessment & Plan:   Problem List Items Addressed This Visit      Other   Right shoulder pain    Acute starting Tuesday w/no reported change in activity Hurts laterally/ superiorly and pain worse to abduct Suspect strain or tendonitis Xray today to r/o bony abn or arthritis Given flexeril  She cannot take nsaids  Prn acetaminophen is ok  inst to use ice for 10 min intervals Passive rom exercise shown to prevent frozen shoulder   Further plan with xray result       Relevant Orders   DG Shoulder Right

## 2016-03-10 ENCOUNTER — Telehealth: Payer: Self-pay | Admitting: Family Medicine

## 2016-03-10 DIAGNOSIS — M25511 Pain in right shoulder: Secondary | ICD-10-CM

## 2016-03-10 NOTE — Telephone Encounter (Signed)
-----   Message from Tammi Sou, [place] sent at 03/10/2016  9:31 AM EST ----- Pt notified of xray results and Dr. Marliss Coots comments. Pt agrees with referral to ortho she said it doesn't matter where she goes ([facility] or [facility] is fine) she just request who can get her in the fastest because she said her pain is worsening

## 2016-03-10 NOTE — Telephone Encounter (Signed)
Ref done  Will route to [facility] 

## 2016-03-10 NOTE — Telephone Encounter (Signed)
Appt made at [facility] on 03/11/16 at 1pm, patient aware.

## 2016-03-11 DIAGNOSIS — M7541 Impingement syndrome of right shoulder: Secondary | ICD-10-CM | POA: Diagnosis not present

## 2016-03-18 DIAGNOSIS — M25511 Pain in right shoulder: Secondary | ICD-10-CM | POA: Diagnosis not present

## 2016-03-18 DIAGNOSIS — M7541 Impingement syndrome of right shoulder: Secondary | ICD-10-CM | POA: Diagnosis not present

## 2016-03-23 DIAGNOSIS — M7541 Impingement syndrome of right shoulder: Secondary | ICD-10-CM | POA: Diagnosis not present

## 2016-03-23 DIAGNOSIS — M25511 Pain in right shoulder: Secondary | ICD-10-CM | POA: Diagnosis not present

## 2016-03-25 DIAGNOSIS — M25511 Pain in right shoulder: Secondary | ICD-10-CM | POA: Diagnosis not present

## 2016-03-25 DIAGNOSIS — M7541 Impingement syndrome of right shoulder: Secondary | ICD-10-CM | POA: Diagnosis not present

## 2016-03-28 ENCOUNTER — Other Ambulatory Visit: Payer: Self-pay | Admitting: Family Medicine

## 2016-03-30 DIAGNOSIS — M25511 Pain in right shoulder: Secondary | ICD-10-CM | POA: Diagnosis not present

## 2016-03-30 DIAGNOSIS — M7541 Impingement syndrome of right shoulder: Secondary | ICD-10-CM | POA: Diagnosis not present

## 2016-04-01 DIAGNOSIS — M25511 Pain in right shoulder: Secondary | ICD-10-CM | POA: Diagnosis not present

## 2016-04-01 DIAGNOSIS — M7541 Impingement syndrome of right shoulder: Secondary | ICD-10-CM | POA: Diagnosis not present

## 2016-04-06 DIAGNOSIS — M7541 Impingement syndrome of right shoulder: Secondary | ICD-10-CM | POA: Diagnosis not present

## 2016-04-06 DIAGNOSIS — M25511 Pain in right shoulder: Secondary | ICD-10-CM | POA: Diagnosis not present

## 2016-04-08 DIAGNOSIS — M7541 Impingement syndrome of right shoulder: Secondary | ICD-10-CM | POA: Diagnosis not present

## 2016-04-08 DIAGNOSIS — M25511 Pain in right shoulder: Secondary | ICD-10-CM | POA: Diagnosis not present

## 2016-04-13 ENCOUNTER — Other Ambulatory Visit: Payer: Self-pay | Admitting: Family Medicine

## 2016-04-13 DIAGNOSIS — M25511 Pain in right shoulder: Secondary | ICD-10-CM | POA: Diagnosis not present

## 2016-04-13 DIAGNOSIS — M7541 Impingement syndrome of right shoulder: Secondary | ICD-10-CM | POA: Diagnosis not present

## 2016-04-15 DIAGNOSIS — M7541 Impingement syndrome of right shoulder: Secondary | ICD-10-CM | POA: Diagnosis not present

## 2016-04-15 DIAGNOSIS — M25511 Pain in right shoulder: Secondary | ICD-10-CM | POA: Diagnosis not present

## 2016-04-20 DIAGNOSIS — M7541 Impingement syndrome of right shoulder: Secondary | ICD-10-CM | POA: Diagnosis not present

## 2016-04-20 DIAGNOSIS — M25511 Pain in right shoulder: Secondary | ICD-10-CM | POA: Diagnosis not present

## 2016-04-22 DIAGNOSIS — M7541 Impingement syndrome of right shoulder: Secondary | ICD-10-CM | POA: Diagnosis not present

## 2016-04-22 DIAGNOSIS — M25511 Pain in right shoulder: Secondary | ICD-10-CM | POA: Diagnosis not present

## 2016-04-24 ENCOUNTER — Other Ambulatory Visit: Payer: Self-pay | Admitting: Family Medicine

## 2016-05-27 DIAGNOSIS — Z1231 Encounter for screening mammogram for malignant neoplasm of breast: Secondary | ICD-10-CM | POA: Diagnosis not present

## 2016-06-05 ENCOUNTER — Encounter: Payer: Self-pay | Admitting: Family Medicine

## 2016-06-15 ENCOUNTER — Telehealth: Payer: Self-pay | Admitting: Family Medicine

## 2016-06-15 DIAGNOSIS — E119 Type 2 diabetes mellitus without complications: Secondary | ICD-10-CM

## 2016-06-15 DIAGNOSIS — Z0000 Encounter for general adult medical examination without abnormal findings: Secondary | ICD-10-CM

## 2016-06-15 NOTE — Telephone Encounter (Signed)
-----   Message from Eustace Pen, LPN sent at 10/16/6740  4:00 PM EDT ----- Regarding: Labs 5/3 Please place lab orders. Needs A1C per health maintenance.  [facility] Medicare

## 2016-06-18 ENCOUNTER — Ambulatory Visit (INDEPENDENT_AMBULATORY_CARE_PROVIDER_SITE_OTHER): Payer: Medicare Other

## 2016-06-18 VITALS — BP 134/80 | HR 62 | Temp 97.9°F | Ht 60.5 in | Wt 143.8 lb

## 2016-06-18 DIAGNOSIS — Z0000 Encounter for general adult medical examination without abnormal findings: Secondary | ICD-10-CM

## 2016-06-18 DIAGNOSIS — E119 Type 2 diabetes mellitus without complications: Secondary | ICD-10-CM | POA: Diagnosis not present

## 2016-06-18 LAB — CBC WITH DIFFERENTIAL/PLATELET
Basophils Absolute: 0.1 10*3/uL (ref 0.0–0.1)
Basophils Relative: 0.6 % (ref 0.0–3.0)
EOS PCT: 2.4 % (ref 0.0–5.0)
Eosinophils Absolute: 0.2 10*3/uL (ref 0.0–0.7)
HCT: 38.6 % (ref 36.0–46.0)
Hemoglobin: 13.1 g/dL (ref 12.0–15.0)
LYMPHS ABS: 2.9 10*3/uL (ref 0.7–4.0)
Lymphocytes Relative: 37.1 % (ref 12.0–46.0)
MCHC: 33.8 g/dL (ref 30.0–36.0)
MCV: 83.4 fl (ref 78.0–100.0)
MONO ABS: 0.6 10*3/uL (ref 0.1–1.0)
Monocytes Relative: 7.3 % (ref 3.0–12.0)
NEUTROS PCT: 52.6 % (ref 43.0–77.0)
Neutro Abs: 4.2 10*3/uL (ref 1.4–7.7)
Platelets: 329 10*3/uL (ref 150.0–400.0)
RBC: 4.63 Mil/uL (ref 3.87–5.11)
RDW: 14.2 % (ref 11.5–15.5)
WBC: 7.9 10*3/uL (ref 4.0–10.5)

## 2016-06-18 LAB — COMPREHENSIVE METABOLIC PANEL
ALT: 14 U/L (ref 0–35)
AST: 21 U/L (ref 0–37)
Albumin: 4.2 g/dL (ref 3.5–5.2)
Alkaline Phosphatase: 69 U/L (ref 39–117)
BILIRUBIN TOTAL: 0.5 mg/dL (ref 0.2–1.2)
BUN: 12 mg/dL (ref 6–23)
CO2: 32 meq/L (ref 19–32)
Calcium: 10.4 mg/dL (ref 8.4–10.5)
Chloride: 99 mEq/L (ref 96–112)
Creatinine, Ser: 0.99 mg/dL (ref 0.40–1.20)
GFR: 70.48 mL/min (ref >60.00)
GLUCOSE: 116 mg/dL — AB (ref 70–99)
Potassium: 3.7 mEq/L (ref 3.5–5.1)
SODIUM: 138 meq/L (ref 135–145)
TOTAL PROTEIN: 7.6 g/dL (ref 6.0–8.3)

## 2016-06-18 LAB — LIPID PANEL
CHOLESTEROL: 166 mg/dL (ref 0–200)
HDL: 73.6 mg/dL (ref >39.00)
LDL CALC: 69 mg/dL (ref 0–99)
NonHDL: 92.09
Total CHOL/HDL Ratio: 2
Triglycerides: 113 mg/dL (ref 0.0–149.0)
VLDL: 22.6 mg/dL (ref 0.0–40.0)

## 2016-06-18 LAB — TSH: TSH: 3.83 u[IU]/mL (ref 0.35–4.50)

## 2016-06-18 LAB — HEMOGLOBIN A1C: Hgb A1c MFr Bld: 7 % — ABNORMAL HIGH (ref 4.6–6.5)

## 2016-06-18 NOTE — Patient Instructions (Addendum)
[name] , Thank you for taking time to come for your Medicare Wellness Visit. I appreciate your ongoing commitment to your health goals. Please review the following plan we discussed and let me know if I can assist you in the future.   These are the goals we discussed: Goals    . Increase physical activity          When weather permits, I will attempt to walk at least 30 min 3 days per week.       This is a list of the screening recommended for you and due dates:  Health Maintenance  Topic Date Due  . Pap Smear  03/20/2020*  . Complete foot exam   07/04/2016  . Flu Shot  09/16/2016  . Eye exam for diabetics  09/26/2016  . Hemoglobin A1C  12/19/2016  . Mammogram  05/27/2017  . Colon Cancer Screening  06/17/2019  . Tetanus Vaccine  07/12/2023  . DEXA scan (bone density measurement)  Completed  . Pneumonia vaccines  Completed  *Topic was postponed. The date shown is not the original due date.   Preventive Care for Adults  A healthy lifestyle and preventive care can promote health and wellness. Preventive health guidelines for adults include the following key practices.  . A routine yearly physical is a good way to check with your health care provider about your health and preventive screening. It is a chance to share any concerns and updates on your health and to receive a thorough exam.  . Visit your dentist for a routine exam and preventive care every 6 months. Brush your teeth twice a day and floss once a day. Good oral hygiene prevents tooth decay and gum disease.  . The frequency of eye exams is based on your age, health, family medical history, use  of contact lenses, and other factors. Follow your health care provider's ecommendations for frequency of eye exams.  . Eat a healthy diet. Foods like vegetables, fruits, whole grains, low-fat dairy products, and lean protein foods contain the nutrients you need without too many calories. Decrease your intake of foods high in  solid fats, added sugars, and salt. Eat the right amount of calories for you. Get information about a proper diet from your health care provider, if necessary.  . Regular physical exercise is one of the most important things you can do for your health. Most adults should get at least 150 minutes of moderate-intensity exercise (any activity that increases your heart rate and causes you to sweat) each week. In addition, most adults need muscle-strengthening exercises on 2 or more days a week.  Silver Sneakers may be a benefit available to you. To determine eligibility, you may visit the website: www.silversneakers.com or contact program at 231-463-0328 Mon-Fri between 8AM-8PM.   . Maintain a healthy weight. The body mass index (BMI) is a screening tool to identify possible weight problems. It provides an estimate of body fat based on height and weight. Your health care provider can find your BMI and can help you achieve or maintain a healthy weight.   For adults 20 years and older: ? A BMI below 18.5 is considered underweight. ? A BMI of 18.5 to 24.9 is normal. ? A BMI of 25 to 29.9 is considered overweight. ? A BMI of 30 and above is considered obese.   . Maintain normal blood lipids and cholesterol levels by exercising and minimizing your intake of saturated fat. Eat a balanced diet with plenty of fruit  and vegetables. Blood tests for lipids and cholesterol should begin at age 79 and be repeated every 5 years. If your lipid or cholesterol levels are high, you are over 50, or you are at high risk for heart disease, you may need your cholesterol levels checked more frequently. Ongoing high lipid and cholesterol levels should be treated with medicines if diet and exercise are not working.  . If you smoke, find out from your health care provider how to quit. If you do not use tobacco, please do not start.  . If you choose to drink alcohol, please do not consume more than 2 drinks per day. One drink  is considered to be 12 ounces (355 mL) of beer, 5 ounces (148 mL) of wine, or 1.5 ounces (44 mL) of liquor.  . If you are 3-64 years old, ask your health care provider if you should take aspirin to prevent strokes.  . Use sunscreen. Apply sunscreen liberally and repeatedly throughout the day. You should seek shade when your shadow is shorter than you. Protect yourself by wearing long sleeves, pants, a wide-brimmed hat, and sunglasses year round, whenever you are outdoors.  . Once a month, do a whole body skin exam, using a mirror to look at the skin on your back. Tell your health care provider of new moles, moles that have irregular borders, moles that are larger than a pencil eraser, or moles that have changed in shape or color.

## 2016-06-18 NOTE — Progress Notes (Signed)
I reviewed health advisor's note, was available for consultation, and agree with documentation and plan.  

## 2016-06-18 NOTE — Progress Notes (Signed)
Subjective:   [name] is a 76 y.o. female who presents for Medicare Annual (Subsequent) preventive examination.  Review of Systems:  N/A Cardiac Risk Factors include: advanced age (>39mn, >>34women);diabetes mellitus;dyslipidemia;hypertension     Objective:     Vitals: BP 134/80 (BP Location: Right Arm, Patient Position: Sitting, Cuff Size: Normal)   Pulse 62   Temp 97.9 F (36.6 C) (Oral)   Ht 5' 0.5" (1.537 m) Comment: no shoes  Wt 143 lb 12 oz (65.2 kg)   SpO2 98%   BMI 27.61 kg/m   Body mass index is 27.61 kg/m.   Tobacco History  Smoking Status  . Never Smoker  Smokeless Tobacco  . Never Used     Counseling given: No   Past Medical History:  Diagnosis Date  . 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 Fibroids   . Gallstones    incidental asymptomatic gallstones  . Heart murmur   . Hyperlipidemia   . Hypertension    History reviewed. No pertinent surgical history. Family History  Problem Relation Age of Onset  . Hypertension Mother   . Diabetes Mother   . Osteoporosis Sister   . Diabetes Brother    History  Sexual Activity  . Sexual activity: Not on file    Outpatient Encounter Prescriptions as of 06/18/2016  Medication Sig  . aspirin 81 MG tablet Take 81 mg by mouth daily.  .Marland Kitchenatorvastatin (LIPITOR) 10 MG tablet Take 1 tablet (10 mg total) by mouth daily. In evening  . Blood Glucose Monitoring Suppl (ACCU-CHEK AVIVA PLUS) W/DEVICE KIT Check glucose once daily and as needed for DM 250.0   . cholecalciferol (VITAMIN D) 1000 UNITS tablet Take 1,000 Units by mouth daily.  .Marland Kitchenglucose blood (ACCU-CHEK AVIVA PLUS) test strip USE AS DIRECTED TO CHECK BLOOD SUGAR ONCE A DAY (DX. E11.9)  . hydrochlorothiazide (HYDRODIURIL) 25 MG tablet TAKE ONE TABLET BY MOUTH EVERY MORNING  . Lancets (ACCU-CHEK MULTICLIX) lancets Check glucose once daily and as needed for diabetes DM2 250.00  . lisinopril (PRINIVIL,ZESTRIL) 40 MG tablet TAKE 1 TABLET BY MOUTH DAILY  .  Multiple Vitamin (MULTIVITAMIN) tablet Take 1 tablet by mouth daily.    . potassium chloride (K-DUR,KLOR-CON) 10 MEQ tablet TAKE 1 TABLET BY MOUTH DAILY  . cyclobenzaprine (FLEXERIL) 10 MG tablet Take 0.5-1 tablets (5-10 mg total) by mouth 3 (three) times daily as needed for muscle spasms. Caution of sedation (Patient not taking: Reported on 06/18/2016)   No facility-administered encounter medications on file as of 06/18/2016.     Activities of Daily Living In your present state of health, do you have any difficulty performing the following activities: 06/18/2016  Hearing? N  Vision? N  Difficulty concentrating or making decisions? N  Walking or climbing stairs? N  Dressing or bathing? N  Doing errands, shopping? N  Preparing Food and eating ? N  Using the Toilet? N  In the past six months, have you accidently leaked urine? Y  Do you have problems with loss of bowel control? N  Managing your Medications? N  Managing your Finances? N  Housekeeping or managing your Housekeeping? N  Some recent data might be hidden    Patient Care Team: MAbner Greenspan MD as PCP - General    Assessment:     Hearing Screening   _0  _1  _2  _3  _4  _5  _6  _7  _8   Right ear:   40 40 40  40    Left ear:  0 0 40  0    Vision Screening Comments: Last vision exam in Aug 2017 with Dr. George Ina   Exercise Activities and Dietary recommendations Current Exercise Habits: The patient does not participate in regular exercise at present, Exercise limited by: None identified  Goals    . Increase physical activity          When weather permits, I will attempt to walk at least 30 min 3 days per week.      Fall Risk Fall Risk  06/18/2016 04/05/2015 03/21/2014 03/07/2013 03/01/2012  Falls in the past year? _0    Depression Screen PHQ 2/9 Scores 06/18/2016 04/05/2015 03/21/2014 03/07/2013  PHQ - 2 Score 0 0 0 0     Cognitive Function MMSE - Mini Mental State Exam 06/18/2016    Orientation to time 5  Orientation to Place 5  Registration 3  Attention/ Calculation 0  Recall 3  Language- name 2 objects 0  Language- repeat 1  Language- follow 3 step command 3  Language- read & follow direction 0  Write a sentence 0  Copy design 0  Total score 20     PLEASE NOTE: A Mini-Cog screen was completed. Maximum score is 20. A value of 0 denotes this part of [place]stein MMSE was not completed or the patient failed this part of the Mini-Cog screening.   Mini-Cog Screening Orientation to Time - Max 5 pts Orientation to Place - Max 5 pts Registration - Max 3 pts Recall - Max 3 pts Language Repeat - Max 1 pts Language Follow 3 Step Command - Max 3 pts     Immunization History  Administered Date(s) Administered  . Influenza Split 11/29/2010  . Influenza Whole 11/04/2011  . Influenza-Unspecified 11/12/2012, 11/16/2013, 11/21/2013, 11/15/2014, 11/01/2015  . Pneumococcal Conjugate-13 03/21/2014  . Pneumococcal Polysaccharide-23 08/14/2008  . Td 06/06/2003, 07/11/2013  . Tdap 07/11/2013  . Zoster 05/23/2014   Screening Tests Health Maintenance  Topic Date Due  . PAP SMEAR  03/20/2020 (Originally 02/25/2012)  . FOOT EXAM  07/04/2016  . INFLUENZA VACCINE  09/16/2016  . OPHTHALMOLOGY EXAM  09/26/2016  . HEMOGLOBIN A1C  12/19/2016  . MAMMOGRAM  05/27/2017  . COLONOSCOPY  06/17/2019  . TETANUS/TDAP  07/12/2023  . DEXA SCAN  Completed  . PNA vac Low Risk Adult  Completed      Plan:     I have personally reviewed and addressed the Medicare Annual Wellness questionnaire and have noted the following in the patient's chart:  A. Medical and social history B. Use of alcohol, tobacco or illicit drugs  C. Current medications and supplements D. Functional ability and status E.  Nutritional status F.  Physical activity G. Advance directives H. List of other physicians I.  Hospitalizations, surgeries, and ER visits in previous 12 months J.  [place] to  include hearing, vision, cognitive, depression L. Referrals and appointments - none  In addition, I have reviewed and discussed with patient certain preventive protocols, quality metrics, and best practice recommendations. A written personalized care plan for preventive services as well as general preventive health recommendations were provided to patient.  See attached scanned questionnaire for additional information.   Signed,   Lindell Noe, MHA, BS, LPN Health Coach

## 2016-06-18 NOTE — Progress Notes (Signed)
Pre visit review using our clinic review tool, if applicable. No additional management support is needed unless otherwise documented below in the visit note. 

## 2016-06-18 NOTE — Progress Notes (Signed)
PCP notes:   Health maintenance:   A1C - completed  Abnormal screenings:   Hearing - failed  Patient concerns:   None  Nurse concerns:  None  Next PCP appt:   06/24/16 @ 1730

## 2016-06-24 ENCOUNTER — Encounter: Payer: Self-pay | Admitting: Family Medicine

## 2016-06-24 ENCOUNTER — Ambulatory Visit (INDEPENDENT_AMBULATORY_CARE_PROVIDER_SITE_OTHER): Payer: Medicare Other | Admitting: Family Medicine

## 2016-06-24 VITALS — BP 120/74 | HR 80 | Temp 98.2°F | Ht 60.5 in | Wt 143.5 lb

## 2016-06-24 DIAGNOSIS — E7800 Pure hypercholesterolemia, unspecified: Secondary | ICD-10-CM | POA: Diagnosis not present

## 2016-06-24 DIAGNOSIS — E119 Type 2 diabetes mellitus without complications: Secondary | ICD-10-CM

## 2016-06-24 DIAGNOSIS — Z0000 Encounter for general adult medical examination without abnormal findings: Secondary | ICD-10-CM | POA: Diagnosis not present

## 2016-06-24 DIAGNOSIS — I10 Essential (primary) hypertension: Secondary | ICD-10-CM | POA: Diagnosis not present

## 2016-06-24 MED ORDER — LISINOPRIL 40 MG PO TABS: 40.0000 mg | ORAL_TABLET | Freq: Every day | ORAL | 3 refills | Status: DC

## 2016-06-24 MED ORDER — ATORVASTATIN CALCIUM 10 MG PO TABS: 10.0000 mg | ORAL_TABLET | Freq: Every day | ORAL | 3 refills | Status: DC

## 2016-06-24 MED ORDER — HYDROCHLOROTHIAZIDE 25 MG PO TABS: 25.0000 mg | ORAL_TABLET | Freq: Every morning | ORAL | 3 refills | Status: DC

## 2016-06-24 MED ORDER — POTASSIUM CHLORIDE CRYS ER 10 MEQ PO TBCR: 10.0000 meq | EXTENDED_RELEASE_TABLET | Freq: Every day | ORAL | 3 refills | Status: DC

## 2016-06-24 NOTE — Assessment & Plan Note (Signed)
Lab Results  Component Value Date   HGBA1C 7.0 (H) 06/18/2016   This is up  Disc eye and foot care  Disc dietary changes and she will look for her inst materials from DM teaching  See AVS f/u 3 mo with a1c prior Will disc metformin if no improvement

## 2016-06-24 NOTE — Progress Notes (Signed)
Subjective:    Patient ID: [name], female    DOB: 22-Aug-1940, 76 y.o.   MRN: [id number]  HPI Here for health maintenance exam and to review chronic medical problems    Feeling good   Had amw 5/3 Failed hearing screen L ear  She does not notice it /not causing a problem   Wt Readings from Last 3 Encounters:  06/24/16 143 lb 8 oz (65.1 kg)  06/18/16 143 lb 12 oz (65.2 kg)  03/09/16 140 lb 8 oz (63.7 kg)  overall stable  Wants to start exercising more - really likes to walk  bmi 27.5  Mammogram 4/18-negative Self breast exam - no lumps or changes No gyn problems or vaginal bleeding   Colonoscopy 5/11 (redundant colon/poor prep - but no abn noted)  dexa 3/17-BMD is in the normal range  No falls or fractures Taking calcium and vitamin D  Had zostavax 4/16  bp is stable today  No cp or palpitations or headaches or edema  No side effects to medicines  BP Readings from Last 3 Encounters:  06/24/16 120/74  06/18/16 134/80  03/09/16 (!) 148/86      DM2 Lab Results  Component Value Date   HGBA1C 7.0 (H) 06/18/2016   This is up from 6.7 Morning blood sugars are around 120s in am fasting  Does not check in pm  Diet is not great  Drinking sweet tea and eating crackers and pb   Hx of hyperlipidemia Lab Results  Component Value Date   CHOL 166 06/18/2016   CHOL 165 08/16/2015   CHOL 209 (H) 07/01/2015   Lab Results  Component Value Date   HDL 73.60 06/18/2016   HDL 73.60 08/16/2015   HDL 67.20 07/01/2015   Lab Results  Component Value Date   LDLCALC 69 06/18/2016   LDLCALC 74 08/16/2015   LDLCALC 129 (H) 07/01/2015   Lab Results  Component Value Date   TRIG 113.0 06/18/2016   TRIG 86.0 08/16/2015   TRIG 64.0 07/01/2015   Lab Results  Component Value Date   CHOLHDL 2 06/18/2016   CHOLHDL 2 08/16/2015   CHOLHDL 3 07/01/2015   Lab Results  Component Value Date   LDLDIRECT 111.2 02/28/2013   LDLDIRECT 122.4 02/23/2012   LDLDIRECT 110.3  08/24/2011   atorvastatin and diet  Very good profile/at goal     Chemistry      Component Value Date/Time   NA 138 06/18/2016 0822   K 3.7 06/18/2016 0822   K 3.5 01/09/2014 1519   CL 99 06/18/2016 0822   CO2 32 06/18/2016 0822   BUN 12 06/18/2016 0822   CREATININE 0.99 06/18/2016 0822      Component Value Date/Time   CALCIUM 10.4 06/18/2016 0822   ALKPHOS 69 06/18/2016 0822   AST 21 06/18/2016 0822   ALT 14 06/18/2016 0822   BILITOT 0.5 06/18/2016 0822      Lab Results  Component Value Date   WBC 7.9 06/18/2016   HGB 13.1 06/18/2016   HCT 38.6 06/18/2016   MCV 83.4 06/18/2016   PLT 329.0 06/18/2016   Lab Results  Component Value Date   TSH 3.83 06/18/2016    Patient Active Problem List   Diagnosis Date Noted  . Right shoulder pain 03/09/2016  . Leg pain, right 09/13/2015  . Routine general medical examination at a health care facility 04/05/2015  . Estrogen deficiency 04/05/2015  . Hypokalemia 09/04/2013  . Nonspecific abnormal electrocardiogram (ECG) (EKG) 08/30/2013  .  Encounter for Medicare annual wellness exam 03/07/2013  . Fullness of supraclavicular fossa 02/27/2011  . Other screening mammogram 02/25/2011  . Gynecological examination 02/25/2011  . CERVICAL RADICULOPATHY, LEFT 03/31/2010  . DM type 2 (diabetes mellitus, type 2) ([place]) 07/06/2007  . FIBROIDS, UTERUS 03/23/2007  . HYPERCHOLESTEROLEMIA 03/23/2007  . Essential hypertension 03/23/2007  . POSTMENOPAUSAL STATUS 03/23/2007  . MURMUR 03/23/2007   Past Medical History:  Diagnosis Date  . 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 Fibroids   . Gallstones    incidental asymptomatic gallstones  . Heart murmur   . Hyperlipidemia   . Hypertension    No past surgical history on file. Social History  Substance Use Topics  . Smoking status: Never Smoker  . Smokeless tobacco: Never Used  . Alcohol use No   Family History  Problem Relation Age of Onset  . Hypertension Mother   . Diabetes  Mother   . Osteoporosis Sister   . Diabetes Brother    Allergies  Allergen Reactions  . Ibuprofen     REACTION: palpitations  . Sulfamethoxazole-Trimethoprim     REACTION: rash   Current Outpatient Prescriptions on File Prior to Visit  Medication Sig Dispense Refill  . aspirin 81 MG tablet Take 81 mg by mouth daily.    . Blood Glucose Monitoring Suppl (ACCU-CHEK AVIVA PLUS) W/DEVICE KIT Check glucose once daily and as needed for DM 250.0     . cholecalciferol (VITAMIN D) 1000 UNITS tablet Take 1,000 Units by mouth daily.    Marland Kitchen glucose blood (ACCU-CHEK AVIVA PLUS) test strip USE AS DIRECTED TO CHECK BLOOD SUGAR ONCE A DAY (DX. E11.9) 100 each 1  . Lancets (ACCU-CHEK MULTICLIX) lancets Check glucose once daily and as needed for diabetes DM2 250.00 100 each 3  . Multiple Vitamin (MULTIVITAMIN) tablet Take 1 tablet by mouth daily.       No current facility-administered medications on file prior to visit.     Review of Systems    Review of Systems  Constitutional: Negative for fever, appetite change, fatigue and unexpected weight change.  Eyes: Negative for pain and visual disturbance.  Respiratory: Negative for cough and shortness of breath.   Cardiovascular: Negative for cp or palpitations    Gastrointestinal: Negative for nausea, diarrhea and constipation.  Genitourinary: Negative for urgency and frequency.  Skin: Negative for pallor or rash   Neurological: Negative for weakness, light-headedness, numbness and headaches.  MSK pos for R shoulder pain with improvement after therapy Hematological: Negative for adenopathy. Does not bruise/bleed easily.  Psychiatric/Behavioral: Negative for dysphoric mood. The patient is not nervous/anxious.      Objective:   Physical Exam  Constitutional: She appears well-developed and well-nourished. No distress.  Well appearing   HENT:  Head: Normocephalic and atraumatic.  Right Ear: External ear normal.  Left Ear: External ear normal.    Mouth/Throat: Oropharynx is clear and moist.  Eyes: Conjunctivae and EOM are normal. Pupils are equal, round, and reactive to light. No scleral icterus.  Neck: Normal range of motion. Neck supple. No JVD present. Carotid bruit is not present. No thyromegaly present.  Cardiovascular: Normal rate, regular rhythm and intact distal pulses.  Exam reveals no gallop.   Murmur heard. Pulmonary/Chest: Effort normal and breath sounds normal. No respiratory distress. She has no wheezes. She exhibits no tenderness.  Abdominal: Soft. Bowel sounds are normal. She exhibits no distension, no abdominal bruit and no mass. There is no tenderness.  Genitourinary: No breast swelling, tenderness,  discharge or bleeding.  Genitourinary Comments: Breast exam: No mass, nodules, thickening, tenderness, bulging, retraction, inflamation, nipple discharge or skin changes noted.  No axillary or clavicular LA.      Musculoskeletal: Normal range of motion. She exhibits no edema or tenderness.  Lymphadenopathy:    She has no cervical adenopathy.  Neurological: She is alert. She has normal reflexes. No cranial nerve deficit. She exhibits normal muscle tone. Coordination normal.  Skin: Skin is warm and dry. No rash noted. No erythema. No pallor.  Some lentigines    Psychiatric: She has a normal mood and affect.          Assessment & Plan:   Problem List Items Addressed This Visit      Cardiovascular and Mediastinum   Essential hypertension - Primary    bp in fair control at this time  BP Readings from Last 1 Encounters:  06/24/16 120/74   No changes needed Disc lifstyle change with low sodium diet and exercise  Labs reviewed        Relevant Medications   atorvastatin (LIPITOR) 10 MG tablet   hydrochlorothiazide (HYDRODIURIL) 25 MG tablet   lisinopril (PRINIVIL,ZESTRIL) 40 MG tablet     Endocrine   DM type 2 (diabetes mellitus, type 2) ([facility])    Lab Results  Component Value Date   HGBA1C 7.0 (H)  06/18/2016   This is up  Disc eye and foot care  Disc dietary changes and she will look for her inst materials from DM teaching  See AVS f/u 3 mo with a1c prior Will disc metformin if no improvement       Relevant Medications   atorvastatin (LIPITOR) 10 MG tablet   lisinopril (PRINIVIL,ZESTRIL) 40 MG tablet     Other   HYPERCHOLESTEROLEMIA    Disc goals for lipids and reasons to control them Rev labs with pt Rev low sat fat diet in detail  Continue atorvastatin and diet       Relevant Medications   atorvastatin (LIPITOR) 10 MG tablet   hydrochlorothiazide (HYDRODIURIL) 25 MG tablet   lisinopril (PRINIVIL,ZESTRIL) 40 MG tablet   Routine general medical examination at a health care facility    Reviewed health habits including diet and exercise and skin cancer prevention Reviewed appropriate screening tests for age  Also reviewed health mt list, fam hx and immunization status , as well as social and family history    Rev AMW Signed up for cologuard for colon cancer screening  Reviewed dexa  Disc diet changes for diabetes and plan for exercise

## 2016-06-24 NOTE — Assessment & Plan Note (Signed)
Disc goals for lipids and reasons to control them Rev labs with pt Rev low sat fat diet in detail Continue atorvastatin and diet  

## 2016-06-24 NOTE — Progress Notes (Signed)
Pre visit review using our clinic review tool, if applicable. No additional management support is needed unless otherwise documented below in the visit note. 

## 2016-06-24 NOTE — Assessment & Plan Note (Signed)
bp in fair control at this time  BP Readings from Last 1 Encounters:  06/24/16 120/74   No changes needed Disc lifstyle change with low sodium diet and exercise  Labs reviewed

## 2016-06-24 NOTE — Patient Instructions (Addendum)
Your diabetes is worse  Check blood sugar 2 hours after either lunch or dinner 3 days per week  Start eating a lower sugar/lower carbohydrate diet  Try to get your carbohydrates from produce and eat less bread/pasta/crackers/snack foods /rice/sweetened drinks and sweet Sweet potatoes are better than white potatoes  Try to eat out less  You would do best to keep out of the middle of the supermarket  Get your diabetic education materials out and review them  Make a goal of 30 minutes or more of walking per day   I will set you up for the cologuard program for colon cancer screening  Follow up in 3 months with labs prior for your diabetes If needed we will start some medication

## 2016-06-24 NOTE — Assessment & Plan Note (Signed)
Reviewed health habits including diet and exercise and skin cancer prevention Reviewed appropriate screening tests for age  Also reviewed health mt list, fam hx and immunization status , as well as social and family history    Rev AMW Signed up for cologuard for colon cancer screening  Reviewed dexa  Disc diet changes for diabetes and plan for exercise

## 2016-07-06 ENCOUNTER — Encounter: Payer: Self-pay | Admitting: Family Medicine

## 2016-07-06 DIAGNOSIS — Z1211 Encounter for screening for malignant neoplasm of colon: Secondary | ICD-10-CM | POA: Diagnosis not present

## 2016-07-06 DIAGNOSIS — Z1212 Encounter for screening for malignant neoplasm of rectum: Secondary | ICD-10-CM | POA: Diagnosis not present

## 2016-07-06 LAB — COLOGUARD

## 2016-07-15 ENCOUNTER — Encounter: Payer: Self-pay | Admitting: *Deleted

## 2016-09-21 ENCOUNTER — Other Ambulatory Visit (INDEPENDENT_AMBULATORY_CARE_PROVIDER_SITE_OTHER): Payer: Medicare Other

## 2016-09-21 DIAGNOSIS — E119 Type 2 diabetes mellitus without complications: Secondary | ICD-10-CM | POA: Diagnosis not present

## 2016-09-21 NOTE — Addendum Note (Signed)
Addended by: Daralene Milch C on: 09/21/2016 10:20 AM   Modules accepted: Orders

## 2016-09-21 NOTE — Addendum Note (Signed)
Addended by: Marchia Bond on: 09/21/2016 10:19 AM   Modules accepted: Orders

## 2016-09-22 LAB — HEMOGLOBIN A1C
HEMOGLOBIN A1C: 6.4 % — AB (ref <5.7)
MEAN PLASMA GLUCOSE: 137 mg/dL

## 2016-09-30 ENCOUNTER — Ambulatory Visit (INDEPENDENT_AMBULATORY_CARE_PROVIDER_SITE_OTHER): Payer: Medicare Other | Admitting: Family Medicine

## 2016-09-30 ENCOUNTER — Encounter: Payer: Self-pay | Admitting: Family Medicine

## 2016-09-30 VITALS — BP 124/62 | HR 75 | Temp 98.1°F | Ht 60.5 in | Wt 140.5 lb

## 2016-09-30 DIAGNOSIS — E119 Type 2 diabetes mellitus without complications: Secondary | ICD-10-CM

## 2016-09-30 DIAGNOSIS — M25561 Pain in right knee: Secondary | ICD-10-CM | POA: Diagnosis not present

## 2016-09-30 DIAGNOSIS — I10 Essential (primary) hypertension: Secondary | ICD-10-CM

## 2016-09-30 NOTE — Patient Instructions (Signed)
Keep eating low sugar and avoiding sodas  Glucose control is improved   Elevate leg-put ice on and behind the knee for 10 minutes at a time Wear the knee band when you are up and around  Update [place] if no improvement in several weeks  Follow up in early February

## 2016-09-30 NOTE — Progress Notes (Signed)
Subjective:    Patient ID: [name], female    DOB: [birth year], 76 y.o.   MRN: [id number]  HPI Here for f/u of chronic health problems  Wt Readings from Last 3 Encounters:  09/30/16 140 lb 8 oz (63.7 kg)  06/24/16 143 lb 8 oz (65.1 kg)  06/18/16 143 lb 12 oz (65.2 kg)  quitting candy and sweets  Exercise- has to walk with a cane (having knee pain recently)  Wearing a knee brace    bp is stable today  No cp or palpitations or headaches or edema  No side effects to medicines  BP Readings from Last 3 Encounters:  09/30/16 124/62  06/24/16 120/74  06/18/16 134/80      DM 2 Lab Results  Component Value Date   HGBA1C 6.4 (H) 09/21/2016   This is down from 7.0 Improved  She is not eating as many carbs / sweets and less soda  Diet controlled   DM eye exam 8/17 -has it planned for October (no vision change)  Ace for renal protection  lipitor for cholesterol control  Lab Results  Component Value Date   CHOL 166 06/18/2016   HDL 73.60 06/18/2016   LDLCALC 69 06/18/2016   LDLDIRECT 111.2 02/28/2013   TRIG 113.0 06/18/2016   CHOLHDL 2 06/18/2016    Patient Active Problem List   Diagnosis Date Noted  . Posterior knee pain, right 09/30/2016  . Right shoulder pain 03/09/2016  . Leg pain, right 09/13/2015  . Routine general medical examination at a health care facility 04/05/2015  . Estrogen deficiency 04/05/2015  . Hypokalemia 09/04/2013  . Nonspecific abnormal electrocardiogram (ECG) (EKG) 08/30/2013  . Encounter for Medicare annual wellness exam 03/07/2013  . Fullness of supraclavicular fossa 02/27/2011  . Other screening mammogram 02/25/2011  . Gynecological examination 02/25/2011  . CERVICAL RADICULOPATHY, LEFT 03/31/2010  . DM type 2 (diabetes mellitus, type 2) ([place]) 07/06/2007  . FIBROIDS, UTERUS 03/23/2007  . HYPERCHOLESTEROLEMIA 03/23/2007  . Essential hypertension 03/23/2007  . POSTMENOPAUSAL STATUS 03/23/2007  . MURMUR 03/23/2007   Past Medical  History:  Diagnosis Date  . 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 Fibroids   . Gallstones    incidental asymptomatic gallstones  . Heart murmur   . Hyperlipidemia   . Hypertension    No past surgical history on file. Social History  Substance Use Topics  . Smoking status: Never Smoker  . Smokeless tobacco: Never Used  . Alcohol use No   Family History  Problem Relation Age of Onset  . Hypertension Mother   . Diabetes Mother   . Osteoporosis Sister   . Diabetes Brother    Allergies  Allergen Reactions  . Ibuprofen     REACTION: palpitations  . Sulfamethoxazole-Trimethoprim     REACTION: rash   Current Outpatient Prescriptions on File Prior to Visit  Medication Sig Dispense Refill  . aspirin 81 MG tablet Take 81 mg by mouth daily.    Marland Kitchen atorvastatin (LIPITOR) 10 MG tablet Take 1 tablet (10 mg total) by mouth daily. In evening 90 tablet 3  . Blood Glucose Monitoring Suppl (ACCU-CHEK AVIVA PLUS) W/DEVICE KIT Check glucose once daily and as needed for DM 250.0     . cholecalciferol (VITAMIN D) 1000 UNITS tablet Take 1,000 Units by mouth daily.    Marland Kitchen glucose blood (ACCU-CHEK AVIVA PLUS) test strip USE AS DIRECTED TO CHECK BLOOD SUGAR ONCE A DAY (DX. E11.9) 100 each 1  . hydrochlorothiazide (HYDRODIURIL) 25  MG tablet Take 1 tablet (25 mg total) by mouth every morning. 90 tablet 3  . Lancets (ACCU-CHEK MULTICLIX) lancets Check glucose once daily and as needed for diabetes DM2 250.00 100 each 3  . lisinopril (PRINIVIL,ZESTRIL) 40 MG tablet Take 1 tablet (40 mg total) by mouth daily. 90 tablet 3  . Multiple Vitamin (MULTIVITAMIN) tablet Take 1 tablet by mouth daily.      . potassium chloride (K-DUR,KLOR-CON) 10 MEQ tablet Take 1 tablet (10 mEq total) by mouth daily. 90 tablet 3   No current facility-administered medications on file prior to visit.     Review of Systems Review of Systems  Constitutional: Negative for fever, appetite change, fatigue and unexpected weight change.    Eyes: Negative for pain and visual disturbance.  Respiratory: Negative for cough and shortness of breath.   Cardiovascular: Negative for cp or palpitations    Gastrointestinal: Negative for nausea, diarrhea and constipation.  Genitourinary: Negative for urgency and frequency.  Skin: Negative for pallor or rash   MSK pos for R knee pain  Neurological: Negative for weakness, light-headedness, numbness and headaches.  Hematological: Negative for adenopathy. Does not bruise/bleed easily.  Psychiatric/Behavioral: Negative for dysphoric mood. The patient is not nervous/anxious.         Objective:   Physical Exam  Constitutional: She appears well-developed and well-nourished. No distress.  Well appearing  HENT:  Head: Normocephalic and atraumatic.  Mouth/Throat: Oropharynx is clear and moist.  Eyes: Pupils are equal, round, and reactive to light. Conjunctivae and EOM are normal.  Neck: Normal range of motion. Neck supple. No JVD present. Carotid bruit is not present. No thyromegaly present.  Cardiovascular: Normal rate, regular rhythm and intact distal pulses.  Exam reveals no gallop.   Murmur heard. Pulmonary/Chest: Effort normal and breath sounds normal. No respiratory distress. She has no wheezes. She has no rales.  No crackles  Abdominal: Soft. Bowel sounds are normal. She exhibits no distension, no abdominal bruit and no mass. There is no tenderness.  Musculoskeletal: She exhibits no edema.       Right knee: She exhibits decreased range of motion. She exhibits no swelling, no effusion, no ecchymosis, no deformity, no erythema, normal alignment, no LCL laxity, normal patellar mobility, no bony tenderness, normal meniscus and no MCL laxity. Tenderness found. No medial joint line and no lateral joint line tenderness noted.  Lymphadenopathy:    She has no cervical adenopathy.  Neurological: She is alert. She has normal reflexes.  Skin: Skin is warm and dry. No rash noted.  Psychiatric:  She has a normal mood and affect.          Assessment & Plan:   Problem List Items Addressed This Visit      Cardiovascular and Mediastinum   Essential hypertension - Primary    bp in fair control at this time  BP Readings from Last 1 Encounters:  09/30/16 124/62   No changes needed Disc lifstyle change with low sodium diet and exercise  Continue hctz and lisinopril Labs rev F/u 96mo       Endocrine   DM type 2 [place]di[place] 2) [place]    Lab Results  Component Value Date   HGBA1C 6.4 (H) 09/21/2016   This is down significantly with better diet  No meds at this time  Nl foot exam Eye exam planned for October Enc her to keep working on low glycemic diet and exercise         Other  Posterior knee pain, right    With mild tenderness/ no M felt  Nl rom with pain on full extension No joint line tenderness Adv ice/elevation and relative rest  Alert if no further imp in 1-2 wk

## 2016-10-01 NOTE — Assessment & Plan Note (Signed)
bp in fair control at this time  BP Readings from Last 1 Encounters:  09/30/16 124/62   No changes needed Disc lifstyle change with low sodium diet and exercise  Continue hctz and lisinopril Labs rev F/u 48mo

## 2016-10-01 NOTE — Assessment & Plan Note (Signed)
With mild tenderness/ no M felt  Nl rom with pain on full extension No joint line tenderness Adv ice/elevation and relative rest  Alert if no further imp in 1-2 wk

## 2016-10-01 NOTE — Assessment & Plan Note (Signed)
Lab Results  Component Value Date   HGBA1C 6.4 (H) 09/21/2016   This is down significantly with better diet  No meds at this time  Nl foot exam Eye exam planned for October Enc her to keep working on low glycemic diet and exercise

## 2016-11-16 ENCOUNTER — Other Ambulatory Visit: Payer: Self-pay | Admitting: Family Medicine

## 2016-11-23 DIAGNOSIS — E119 Type 2 diabetes mellitus without complications: Secondary | ICD-10-CM | POA: Diagnosis not present

## 2016-11-23 LAB — HM DIABETES EYE EXAM

## 2016-11-24 ENCOUNTER — Other Ambulatory Visit: Payer: Self-pay

## 2016-11-24 NOTE — Patient Outreach (Signed)
 [facility]) Care Management  11/24/2016  Aemilia Dedrick Jul 15, 1940 [id number]   Medication Adherence call to Mrs. Keyanah Foglio the reason for this call is because Mrs. Roland is showing past due under [place]. On her Atorvastatin 10 mg spoke to patient she said sometimes she forgets to take her medication, she has a pill box she ask if we can call her pharmacy to order her medication which we did we call [facility] ask them to fill her prescription for a 90 days supply pharmacy will have it ready for patient to pick up.   [facility] Management Direct Dial [phone]  Fax [phone] [email or web address]

## 2016-12-03 ENCOUNTER — Encounter: Payer: Self-pay | Admitting: Family Medicine

## 2016-12-21 ENCOUNTER — Other Ambulatory Visit: Payer: Self-pay

## 2017-02-16 ENCOUNTER — Other Ambulatory Visit: Payer: Self-pay

## 2017-02-16 ENCOUNTER — Ambulatory Visit (HOSPITAL_COMMUNITY)
Admission: EM | Admit: 2017-02-16 | Discharge: 2017-02-16 | Disposition: A | Discharge status: Home / Self Care | Payer: Medicare Other | Attending: Physician Assistant | Admitting: Physician Assistant

## 2017-02-16 ENCOUNTER — Encounter (HOSPITAL_COMMUNITY): Payer: Self-pay | Admitting: *Deleted

## 2017-02-16 DIAGNOSIS — S29012A Strain of muscle and tendon of back wall of thorax, initial encounter: Secondary | ICD-10-CM

## 2017-02-16 MED ORDER — CYCLOBENZAPRINE HCL 5 MG PO TABS: 2.5000 mg | ORAL_TABLET | Freq: Three times a day (TID) | ORAL | 0 refills | Status: DC | PRN

## 2017-02-16 NOTE — ED Triage Notes (Signed)
Per pt left torso pain

## 2017-02-16 NOTE — ED Provider Notes (Signed)
[facility]    CSN: [id number] Arrival date & time: 02/16/17  1730     History   Chief Complaint Chief Complaint  Patient presents with  . Generalized Body Aches    HPI [name] is a 77 y.o. female.   HPI  Pulled a muslce in her left back a few days ago and notes it seems to be getting worse.  REaching overhead makes the pain worse.  Cant take NSAIDS due to palpitations. Is taking tylenol and this is helping.  Tried a left over flexeril and this was helpful as well.   Past Medical History:  Diagnosis Date  . 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 Fibroids   . Gallstones    incidental asymptomatic gallstones  . Heart murmur   . Hyperlipidemia   . Hypertension     Patient Active Problem List   Diagnosis Date Noted  . Posterior knee pain, right 09/30/2016  . Right shoulder pain 03/09/2016  . Leg pain, right 09/13/2015  . Routine general medical examination at a health care facility 04/05/2015  . Estrogen deficiency 04/05/2015  . Hypokalemia 09/04/2013  . Nonspecific abnormal electrocardiogram (ECG) (EKG) 08/30/2013  . Encounter for Medicare annual wellness exam 03/07/2013  . Fullness of supraclavicular fossa 02/27/2011  . Other screening mammogram 02/25/2011  . Gynecological examination 02/25/2011  . CERVICAL RADICULOPATHY, LEFT 03/31/2010  . DM type 2 (diabetes mellitus, type 2) ([place]) 07/06/2007  . FIBROIDS, UTERUS 03/23/2007  . HYPERCHOLESTEROLEMIA 03/23/2007  . Essential hypertension 03/23/2007  . POSTMENOPAUSAL STATUS 03/23/2007  . MURMUR 03/23/2007    History reviewed. No pertinent surgical history.  OB History    No data available       Home Medications    Prior to Admission medications   Medication Sig Start Date End Date Taking? Authorizing Provider  ACCU-CHEK AVIVA PLUS test strip USE AS DIRECTED TO CHECK BLOOD SUGARS ONCE DAILY 11/16/16   Tower, Wynelle Fanny, MD  aspirin 81 MG tablet Take 81 mg by mouth daily.    [provider]  atorvastatin (LIPITOR) 10 MG tablet Take 1 tablet (10 mg total) by mouth daily. In evening 06/24/16   Tower, Wynelle Fanny, MD  Blood Glucose Monitoring Suppl (ACCU-CHEK AVIVA PLUS) W/DEVICE KIT Check glucose once daily and as needed for DM 250.0     [provider]  cholecalciferol (VITAMIN D) 1000 UNITS tablet Take 1,000 Units by mouth daily.    [provider]  hydrochlorothiazide (HYDRODIURIL) 25 MG tablet Take 1 tablet (25 mg total) by mouth every morning. 06/24/16   Tower, Wynelle Fanny, MD  Lancets (ACCU-CHEK MULTICLIX) lancets Check glucose once daily and as needed for diabetes DM2 250.00 03/07/13   Tower, Wynelle Fanny, MD  lisinopril (PRINIVIL,ZESTRIL) 40 MG tablet Take 1 tablet (40 mg total) by mouth daily. 06/24/16   Tower, Wynelle Fanny, MD  Multiple Vitamin (MULTIVITAMIN) tablet Take 1 tablet by mouth daily.      [provider]  potassium chloride (K-DUR,KLOR-CON) 10 MEQ tablet Take 1 tablet (10 mEq total) by mouth daily. 06/24/16   Tower, Wynelle Fanny, MD    Family History Family History  Problem Relation Age of Onset  . Hypertension Mother   . Diabetes Mother   . Osteoporosis Sister   . Diabetes Brother     Social History Social History   Tobacco Use  . Smoking status: Never Smoker  . Smokeless tobacco: Never Used  Substance Use Topics  . Alcohol use:  No    Alcohol/week: 0.0 oz  . Drug use: No     Allergies   Ibuprofen and Sulfamethoxazole-trimethoprim   Review of Systems Review of Systems  Respiratory: Negative for chest tightness.   Cardiovascular: Negative for chest pain.  Musculoskeletal: Positive for back pain. Negative for joint swelling and neck pain.  Neurological: Negative for dizziness.     Physical Exam Triage Vital Signs ED Triage Vitals  Enc Vitals Group     BP 02/16/17 1832 140/61     Pulse Rate 02/16/17 1832 77     Resp --      Temp 02/16/17 1832 99 F (37.2 C)     Temp src --      SpO2 02/16/17 1832 100 %     Weight --       Height --      Head Circumference --      Peak Flow --      Pain Score 02/16/17 1834 4     Pain Loc --      Pain Edu? --      Excl. in [place]? --    No data found.  Updated Vital Signs BP 140/61 (BP Location: Left Arm)   Pulse 77   Temp 99 F (37.2 C)   SpO2 100%   Visual Acuity Right Eye Distance:   Left Eye Distance:   Bilateral Distance:    Right Eye Near:   Left Eye Near:    Bilateral Near:     Physical Exam  Constitutional: She is oriented to person, place, and time. She appears well-nourished. No distress.  Eyes: EOM are normal. Pupils are equal, round, and reactive to light.  Cardiovascular: Normal rate.  Pulmonary/Chest: Effort normal.  Abdominal: She exhibits no distension.  Musculoskeletal:       Back:  Neurological: She is alert and oriented to person, place, and time. No cranial nerve deficit. Gait normal.  Skin: Skin is dry. She is not diaphoretic.  Psychiatric: She has a normal mood and affect.  Vitals reviewed.    UC Treatments / Results  Labs (all labs ordered are listed, but only abnormal results are displayed) Labs Reviewed - No data to display  EKG  EKG Interpretation None       Radiology No results found.  Procedures Procedures (including critical care time)  Medications Ordered in UC Medications - No data to display   Initial Impression / Assessment and Plan / UC Course  I have reviewed the triage vital signs and the nursing notes.  Pertinent labs & imaging results that were available during my care of the patient were reviewed by me and considered in my medical decision making (see chart for details).     No red flags.  Good lung sounds and no cough.  Has some tenderness about the left lat tenderness. Will try her on flexeril because she said this helped.   Final Clinical Impressions(s) / UC Diagnoses   Final diagnoses:  Strain of latissimus dorsi muscle, initial encounter    ED Discharge Orders    None        Controlled Substance Prescriptions [place] Controlled Substance Registry consulted? No   Hillis Range 02/16/17 1912

## 2017-04-02 ENCOUNTER — Ambulatory Visit: Payer: Medicare Other | Admitting: Family Medicine

## 2017-04-02 ENCOUNTER — Encounter: Payer: Self-pay | Admitting: Family Medicine

## 2017-04-02 VITALS — BP 126/66 | HR 79 | Temp 97.6°F | Ht 60.5 in | Wt 142.1 lb

## 2017-04-02 DIAGNOSIS — I10 Essential (primary) hypertension: Secondary | ICD-10-CM

## 2017-04-02 DIAGNOSIS — E7800 Pure hypercholesterolemia, unspecified: Secondary | ICD-10-CM | POA: Diagnosis not present

## 2017-04-02 DIAGNOSIS — E119 Type 2 diabetes mellitus without complications: Secondary | ICD-10-CM

## 2017-04-02 LAB — COMPREHENSIVE METABOLIC PANEL
ALT: 15 U/L (ref 0–35)
AST: 24 U/L (ref 0–37)
Albumin: 4.3 g/dL (ref 3.5–5.2)
Alkaline Phosphatase: 71 U/L (ref 39–117)
BILIRUBIN TOTAL: 0.6 mg/dL (ref 0.2–1.2)
BUN: 17 mg/dL (ref 6–23)
CHLORIDE: 100 meq/L (ref 96–112)
CO2: 30 meq/L (ref 19–32)
CREATININE: 1.01 mg/dL (ref 0.40–1.20)
Calcium: 10 mg/dL (ref 8.4–10.5)
GFR: 68.73 mL/min (ref >60.00)
Glucose, Bld: 122 mg/dL — ABNORMAL HIGH (ref 70–99)
Potassium: 3.2 mEq/L — ABNORMAL LOW (ref 3.5–5.1)
SODIUM: 139 meq/L (ref 135–145)
Total Protein: 7.9 g/dL (ref 6.0–8.3)

## 2017-04-02 LAB — LIPID PANEL
CHOL/HDL RATIO: 2
Cholesterol: 174 mg/dL (ref 0–200)
HDL: 75.7 mg/dL (ref >39.00)
LDL CALC: 83 mg/dL (ref 0–99)
NonHDL: 98.1
Triglycerides: 75 mg/dL (ref 0.0–149.0)
VLDL: 15 mg/dL (ref 0.0–40.0)

## 2017-04-02 LAB — HEMOGLOBIN A1C: HEMOGLOBIN A1C: 6.8 % — AB (ref 4.6–6.5)

## 2017-04-02 MED ORDER — ATORVASTATIN CALCIUM 10 MG PO TABS: 10.0000 mg | ORAL_TABLET | Freq: Every day | ORAL | 3 refills | Status: DC

## 2017-04-02 MED ORDER — LISINOPRIL 40 MG PO TABS: 40.0000 mg | ORAL_TABLET | Freq: Every day | ORAL | 3 refills | Status: DC

## 2017-04-02 MED ORDER — POTASSIUM CHLORIDE CRYS ER 10 MEQ PO TBCR: 10.0000 meq | EXTENDED_RELEASE_TABLET | Freq: Every day | ORAL | 3 refills | Status: DC

## 2017-04-02 MED ORDER — HYDROCHLOROTHIAZIDE 25 MG PO TABS: 25.0000 mg | ORAL_TABLET | Freq: Every morning | ORAL | 3 refills | Status: DC

## 2017-04-02 NOTE — Progress Notes (Signed)
Subjective:    Patient ID: [name], female    DOB: 06-25-1940, 77 y.o.   MRN: [id number]  HPI Here for f/u of chronic health problems  Wt Readings from Last 3 Encounters:  04/02/17 142 lb 1.9 oz (64.5 kg)  09/30/16 140 lb 8 oz (63.7 kg)  06/24/16 143 lb 8 oz (65.1 kg)  trying to eat healthy  [facility]  27.30 kg/m   bp is stable today  No cp or palpitations or headaches or edema  No side effects to medicines  BP Readings from Last 3 Encounters:  04/02/17 126/66  02/16/17 140/61  09/30/16 124/62     Diabetes Home sugar results -usually 110 in ams  DM diet - pretty good overall / sometimes overindulges at night (gets hungry) - occ pimento cheese  Eats fruits and vegetables  Eats occ sweets  [facility] - [facility] [facility]  Symptoms-none  A1C last  Lab Results  Component Value Date   HGBA1C 6.4 (H) 09/21/2016  due for labs   No blood sugar medication  Renal protection ace  Last eye exam  10/18  Hyperlipidemia Lab Results  Component Value Date   CHOL 166 06/18/2016   HDL 73.60 06/18/2016   LDLCALC 69 06/18/2016   LDLDIRECT 111.2 02/28/2013   TRIG 113.0 06/18/2016   CHOLHDL 2 06/18/2016   On atorvastatin and diet  Last night she missed her dose (tries not to)   Patient Active Problem List   Diagnosis Date Noted  . Posterior knee pain, right 09/30/2016  . Right shoulder pain 03/09/2016  . Leg pain, right 09/13/2015  . Routine general medical examination at a health care facility 04/05/2015  . Estrogen deficiency 04/05/2015  . Hypokalemia 09/04/2013  . Nonspecific abnormal electrocardiogram (ECG) (EKG) 08/30/2013  . Encounter for Medicare annual wellness exam 03/07/2013  . Fullness of supraclavicular fossa 02/27/2011  . Other screening mammogram 02/25/2011  . Gynecological examination 02/25/2011  . CERVICAL RADICULOPATHY, LEFT 03/31/2010  . DM type 2 (diabetes mellitus, type 2) ([place])  07/06/2007  . FIBROIDS, UTERUS 03/23/2007  . HYPERCHOLESTEROLEMIA 03/23/2007  . Essential hypertension 03/23/2007  . POSTMENOPAUSAL STATUS 03/23/2007  . MURMUR 03/23/2007   Past Medical History:  Diagnosis Date  . 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 Fibroids   . Gallstones    incidental asymptomatic gallstones  . Heart murmur   . Hyperlipidemia   . Hypertension    History reviewed. No pertinent surgical history. Social History   Tobacco Use  . Smoking status: Never Smoker  . Smokeless tobacco: Never Used  Substance Use Topics  . Alcohol use: No    Alcohol/week: 0.0 oz  . Drug use: No   Family History  Problem Relation Age of Onset  . Hypertension Mother   . Diabetes Mother   . Osteoporosis Sister   . Diabetes Brother    Allergies  Allergen Reactions  . Ibuprofen     REACTION: palpitations  . Sulfamethoxazole-Trimethoprim     REACTION: rash   Current Outpatient Medications on File Prior to Visit  Medication Sig Dispense Refill  . ACCU-CHEK AVIVA PLUS test strip USE AS DIRECTED TO CHECK BLOOD SUGARS ONCE DAILY 100 each 1  . aspirin 81 MG tablet Take 81 mg by mouth daily.    . Blood Glucose Monitoring Suppl (ACCU-CHEK AVIVA PLUS) W/DEVICE KIT Check glucose once daily and as needed for DM 250.0     .  cholecalciferol (VITAMIN D) 1000 UNITS tablet Take 1,000 Units by mouth daily.    . cyclobenzaprine (FLEXERIL) 5 MG tablet Take 0.5-1 tablets (2.5-5 mg total) by mouth 3 (three) times daily as needed for muscle spasms. Do not mix with narcotics. May cause drowsiness. 30 tablet 0  . Lancets (ACCU-CHEK MULTICLIX) lancets Check glucose once daily and as needed for diabetes DM2 250.00 100 each 3  . Multiple Vitamin (MULTIVITAMIN) tablet Take 1 tablet by mouth daily.       No current facility-administered medications on file prior to visit.      Review of Systems  Constitutional: Negative for activity change, appetite change, fatigue, fever and unexpected weight change.    HENT: Negative for congestion, ear pain, rhinorrhea, sinus pressure and sore throat.   Eyes: Negative for pain, redness and visual disturbance.  Respiratory: Negative for cough, shortness of breath and wheezing.   Cardiovascular: Negative for chest pain and palpitations.  Gastrointestinal: Negative for abdominal pain, blood in stool, constipation and diarrhea.  Endocrine: Negative for polydipsia and polyuria.  Genitourinary: Negative for dysuria, frequency and urgency.  Musculoskeletal: Negative for arthralgias, back pain and myalgias.  Skin: Negative for pallor and rash.  Allergic/Immunologic: Negative for environmental allergies.  Neurological: Negative for dizziness, syncope and headaches.  Hematological: Negative for adenopathy. Does not bruise/bleed easily.  Psychiatric/Behavioral: Negative for decreased concentration and dysphoric mood. The patient is not nervous/anxious.        Objective:   Physical Exam  Constitutional: She appears well-developed and well-nourished. No distress.  overwt and well appearing   HENT:  Head: Normocephalic and atraumatic.  Mouth/Throat: Oropharynx is clear and moist.  Eyes: Conjunctivae and EOM are normal. Pupils are equal, round, and reactive to light.  Neck: Normal range of motion. Neck supple. No JVD present. Carotid bruit is not present. No thyromegaly present.  Cardiovascular: Normal rate, regular rhythm and intact distal pulses. Exam reveals no gallop.  Murmur heard. Pulmonary/Chest: Effort normal and breath sounds normal. No respiratory distress. She has no wheezes. She has no rales.  No crackles  Abdominal: Soft. Bowel sounds are normal. She exhibits no distension, no abdominal bruit and no mass. There is no tenderness.  Musculoskeletal: She exhibits no edema.  Lymphadenopathy:    She has no cervical adenopathy.  Neurological: She is alert. She has normal reflexes.  Skin: Skin is warm and dry. No rash noted. No pallor.  Psychiatric: She  has a normal mood and affect.          Assessment & Plan:   Problem List Items Addressed This Visit      Cardiovascular and Mediastinum   Essential hypertension    bp in fair control at this time  BP Readings from Last 1 Encounters:  04/02/17 126/66   No changes needed Disc lifstyle change with low sodium diet and exercise  Labs reviewed       Relevant Medications   atorvastatin (LIPITOR) 10 MG tablet   hydrochlorothiazide (HYDRODIURIL) 25 MG tablet   lisinopril (PRINIVIL,ZESTRIL) 40 MG tablet   Other Relevant Orders   Comprehensive metabolic panel (Completed)   Lipid panel (Completed)     Endocrine   DM type 2 (diabetes mellitus, type 2) ([place]) - Primary    A1C today  Disc diet/exercise /low glycemic [facility]  Eye and foot care discussed F/u in 6 mo      Relevant Medications   atorvastatin (LIPITOR) 10 MG tablet   lisinopril (PRINIVIL,ZESTRIL) 40 MG  tablet   Other Relevant Orders   Comprehensive metabolic panel (Completed)   Hemoglobin A1c (Completed)     Other   HYPERCHOLESTEROLEMIA    Disc goals for lipids and reasons to control them Rev labs with pt Rev low sat fat diet in detail Has missed some doses of atorvastatin-urged compliance Lab today      Relevant Medications   atorvastatin (LIPITOR) 10 MG tablet   hydrochlorothiazide (HYDRODIURIL) 25 MG tablet   lisinopril (PRINIVIL,ZESTRIL) 40 MG tablet   Other Relevant Orders   Lipid panel (Completed)

## 2017-04-02 NOTE — Patient Instructions (Signed)
Labs today  Keep working on [employer] and exercise  Enjoy the [employer] today

## 2017-04-03 ENCOUNTER — Telehealth: Payer: Self-pay | Admitting: Family Medicine

## 2017-04-03 NOTE — Assessment & Plan Note (Signed)
bp in fair control at this time  BP Readings from Last 1 Encounters:  04/02/17 126/66   No changes needed Disc lifstyle change with low sodium diet and exercise  Labs reviewed

## 2017-04-03 NOTE — Assessment & Plan Note (Signed)
A1C today  Disc diet/exercise /low glycemic [facility]  Eye and foot care discussed F/u in 6 mo

## 2017-04-03 NOTE — Assessment & Plan Note (Signed)
Disc goals for lipids and reasons to control them Rev labs with pt Rev low sat fat diet in detail Has missed some doses of atorvastatin-urged compliance Lab today

## 2017-04-05 ENCOUNTER — Telehealth: Payer: Self-pay | Admitting: *Deleted

## 2017-04-05 MED ORDER — POTASSIUM CHLORIDE CRYS ER 20 MEQ PO TBCR: 20.0000 meq | EXTENDED_RELEASE_TABLET | Freq: Every day | ORAL | 3 refills | Status: DC

## 2017-04-05 NOTE — Telephone Encounter (Signed)
Pt hasn't missed any does of K so new dose sent to pharmacy, pt will take 2 tabs daily until she runs out then will increase to the 20 meq that was sent in to pharmacy also pt already has her CPE scheduled so we can re check levels then

## 2017-04-05 NOTE — Telephone Encounter (Signed)
-----   Message from Abner Greenspan, MD sent at 04/03/2017  1:36 PM EST ----- K is down  If she missed doses-get back on track If not - then inc her dose to 2 pills daily -send in new directions for 6 mo of refills  F/u 6 mo  Released on mychart

## 2017-04-26 NOTE — Telephone Encounter (Signed)
Error

## 2017-05-28 ENCOUNTER — Encounter: Payer: Self-pay | Admitting: Family Medicine

## 2017-05-28 DIAGNOSIS — Z1231 Encounter for screening mammogram for malignant neoplasm of breast: Secondary | ICD-10-CM | POA: Diagnosis not present

## 2017-06-08 ENCOUNTER — Telehealth: Payer: Self-pay | Admitting: Family Medicine

## 2017-06-08 MED ORDER — ACCU-CHEK MULTICLIX LANCETS MISC: 3 refills | Status: DC

## 2017-06-08 NOTE — Telephone Encounter (Signed)
Copied from [place] [id number]. Topic: Quick Communication - Rx Refill/Question >> Jun 08, 2017  8:40 AM Antonieta Iba C wrote: Medication: Lancets (ACCU-CHEK MULTICLIX) lancets   Has the patient contacted their pharmacy? Yes   (Agent: If no, request that the patient contact the pharmacy for the refill.)  Preferred Pharmacy (with phone number or street name): [id number] - [place], [place] [phone] (Phone) [phone] (Fax)     Agent: Please be advised that RX refills may take up to 3 business days. We ask that you follow-up with your pharmacy.

## 2017-06-10 ENCOUNTER — Encounter (HOSPITAL_COMMUNITY): Payer: Self-pay | Admitting: Emergency Medicine

## 2017-06-10 ENCOUNTER — Other Ambulatory Visit: Payer: Self-pay

## 2017-06-10 ENCOUNTER — Ambulatory Visit (HOSPITAL_COMMUNITY)
Admission: EM | Admit: 2017-06-10 | Discharge: 2017-06-10 | Disposition: A | Discharge status: Home / Self Care | Payer: Medicare Other | Attending: Internal Medicine | Admitting: Internal Medicine

## 2017-06-10 DIAGNOSIS — S40862A Insect bite (nonvenomous) of left upper arm, initial encounter: Secondary | ICD-10-CM | POA: Diagnosis not present

## 2017-06-10 DIAGNOSIS — W57XXXA Bitten or stung by nonvenomous insect and other nonvenomous arthropods, initial encounter: Secondary | ICD-10-CM

## 2017-06-10 MED ORDER — ACCU-CHEK MULTICLIX LANCETS MISC: 2 refills | Status: DC

## 2017-06-10 MED ORDER — HYDROCORTISONE VALERATE 0.2 % EX OINT: 1.0000 "application " | TOPICAL_OINTMENT | Freq: Two times a day (BID) | CUTANEOUS | 0 refills | Status: DC

## 2017-06-10 MED ORDER — CETIRIZINE HCL 10 MG PO TABS: 10.0000 mg | ORAL_TABLET | Freq: Every day | ORAL | 0 refills | Status: DC

## 2017-06-10 NOTE — ED Provider Notes (Signed)
[facility]    CSN: [id number] Arrival date & time: 06/10/17  [place]     History   Chief Complaint Chief Complaint  Patient presents with  . Insect Bite    HPI [name] is a 77 y.o. female.   77 year old female comes in for 2-day history of insect bite.  States has a left upper arm swelling, redness, itching.  States initially just with itching, has been taking Benadryl with good relief, but came in for evaluation due to swelling and redness.  She denies any pain.  Denies fever, chills, night sweats.     Past Medical History:  Diagnosis Date  . 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 Fibroids   . Gallstones    incidental asymptomatic gallstones  . Heart murmur   . Hyperlipidemia   . Hypertension     Patient Active Problem List   Diagnosis Date Noted  . Leg pain, right 09/13/2015  . Routine general medical examination at a health care facility 04/05/2015  . Estrogen deficiency 04/05/2015  . Hypokalemia 09/04/2013  . Nonspecific abnormal electrocardiogram (ECG) (EKG) 08/30/2013  . Encounter for Medicare annual wellness exam 03/07/2013  . Fullness of supraclavicular fossa 02/27/2011  . Other screening mammogram 02/25/2011  . Gynecological examination 02/25/2011  . CERVICAL RADICULOPATHY, LEFT 03/31/2010  . DM type 2 (diabetes mellitus, type 2) ([place]) 07/06/2007  . FIBROIDS, UTERUS 03/23/2007  . HYPERCHOLESTEROLEMIA 03/23/2007  . Essential hypertension 03/23/2007  . POSTMENOPAUSAL STATUS 03/23/2007  . MURMUR 03/23/2007    History reviewed. No pertinent surgical history.  OB History   None      Home Medications    Prior to Admission medications   Medication Sig Start Date End Date Taking? Authorizing Provider  ACCU-CHEK AVIVA PLUS test strip USE AS DIRECTED TO CHECK BLOOD SUGARS ONCE DAILY 11/16/16   Tower, Wynelle Fanny, MD  aspirin 81 MG tablet Take 81 mg by mouth daily.    [provider]  atorvastatin (LIPITOR) 10 MG tablet Take 1  tablet (10 mg total) by mouth daily. In evening 04/02/17   Tower, Wynelle Fanny, MD  Blood Glucose Monitoring Suppl (ACCU-CHEK AVIVA PLUS) W/DEVICE KIT Check glucose once daily and as needed for DM 250.0     [provider]  cetirizine (ZYRTEC) 10 MG tablet Take 1 tablet (10 mg total) by mouth daily. 06/10/17   Tasia Catchings, Amy V, PA-C  cholecalciferol (VITAMIN D) 1000 UNITS tablet Take 1,000 Units by mouth daily.    [provider]  cyclobenzaprine (FLEXERIL) 5 MG tablet Take 0.5-1 tablets (2.5-5 mg total) by mouth 3 (three) times daily as needed for muscle spasms. Do not mix with narcotics. May cause drowsiness. 02/16/17   Tereasa Coop, PA-C  hydrochlorothiazide (HYDRODIURIL) 25 MG tablet Take 1 tablet (25 mg total) by mouth every morning. 04/02/17   Tower, Wynelle Fanny, MD  hydrocortisone valerate ointment (WESTCORT) 0.2 % Apply 1 application topically 2 (two) times daily. 06/10/17   Tasia Catchings, Amy V, PA-C  Lancets (ACCU-CHEK MULTICLIX) lancets Check glucose once daily (Dx. E11.9) 06/10/17   Tower, Wynelle Fanny, MD  lisinopril (PRINIVIL,ZESTRIL) 40 MG tablet Take 1 tablet (40 mg total) by mouth daily. 04/02/17   Tower, Wynelle Fanny, MD  Multiple Vitamin (MULTIVITAMIN) tablet Take 1 tablet by mouth daily.      [provider]  potassium chloride SA (K-DUR,KLOR-CON) 20 MEQ tablet Take 1 tablet (20 mEq total) by mouth daily. 04/05/17   Tower, Wynelle Fanny, MD  Family History Family History  Problem Relation Age of Onset  . Hypertension Mother   . Diabetes Mother   . Osteoporosis Sister   . Diabetes Brother     Social History Social History   Tobacco Use  . Smoking status: Never Smoker  . Smokeless tobacco: Never Used  Substance Use Topics  . Alcohol use: No    Alcohol/week: 0.0 oz  . Drug use: No     Allergies   Ibuprofen and Sulfamethoxazole-trimethoprim   Review of Systems Review of Systems  Reason unable to perform ROS: See HPI as above.     Physical Exam Triage Vital Signs ED Triage  Vitals  Enc Vitals Group     BP 06/10/17 1719 (!) 141/79     Pulse Rate 06/10/17 1719 74     Resp 06/10/17 1719 18     Temp 06/10/17 1719 98.2 F (36.8 C)     Temp Source 06/10/17 1719 Oral     SpO2 06/10/17 1719 100 %     Weight --      Height --      Head Circumference --      Peak Flow --      Pain Score 06/10/17 1718 7     Pain Loc --      Pain Edu? --      Excl. in [place]? --    No data found.  Updated Vital Signs BP (!) 141/79 (BP Location: Right Arm)   Pulse 74   Temp 98.2 F (36.8 C) (Oral)   Resp 18   SpO2 100%    Physical Exam  Constitutional: She is oriented to person, place, and time. She appears well-developed and well-nourished. No distress.  HENT:  Head: Normocephalic and atraumatic.  Eyes: Pupils are equal, round, and reactive to light. Conjunctivae are normal.  Neurological: She is alert and oriented to person, place, and time.  Skin: Skin is warm and dry.  4cm x5cm swelling with erythema and mild increased warmth. No tenderness to palpation, no fluctuance felt.     UC Treatments / Results  Labs (all labs ordered are listed, but only abnormal results are displayed) Labs Reviewed - No data to display  EKG None Radiology No results found.  Procedures Procedures (including critical care time)  Medications Ordered in UC Medications - No data to display   Initial Impression / Assessment and Plan / UC Course  I have reviewed the triage vital signs and the nursing notes.  Pertinent labs & imaging results that were available during my care of the patient were reviewed by me and considered in my medical decision making (see chart for details).    Swelling more consistent with inflammation.  Given no pain, less suspicious for cellulitis.  No fluctuance felt.  Will have patient take Zyrtec as directed.  Westcort as directed on affected area.  Can supplement with Benadryl if continues to be pruritic.  Return precautions given.  Patient expresses  understanding and agrees to plan.  Final Clinical Impressions(s) / UC Diagnoses   Final diagnoses:  Insect bite of left upper arm, initial encounter    ED Discharge Orders        Ordered    hydrocortisone valerate ointment (WESTCORT) 0.2 %  2 times daily     06/10/17 1747    cetirizine (ZYRTEC) 10 MG tablet  Daily     06/10/17 1747        Ok Edwards, PA-C 06/10/17 1757

## 2017-06-10 NOTE — ED Triage Notes (Signed)
2 days ago noticed swelling to left upper arm, visible swelling, redness and itching.

## 2017-06-10 NOTE — Addendum Note (Signed)
Addended by: Tammi Sou on: 06/10/2017 12:23 PM   Modules accepted: Orders

## 2017-06-10 NOTE — Discharge Instructions (Signed)
No signs of skin infection right now. Start zyrtec for itching. You can take benadryl at night if you need any additional relief to itching. Can apply westcort to affected area to help with inflammation and itching. Monitor for any spreading redness, increased warmth, pain, fever, follow up for reevaluation needed.

## 2017-06-21 ENCOUNTER — Telehealth: Payer: Self-pay

## 2017-06-21 ENCOUNTER — Ambulatory Visit (INDEPENDENT_AMBULATORY_CARE_PROVIDER_SITE_OTHER): Payer: Medicare Other

## 2017-06-21 VITALS — BP 122/72 | HR 72 | Temp 98.6°F | Ht 60.0 in | Wt 140.0 lb

## 2017-06-21 DIAGNOSIS — I10 Essential (primary) hypertension: Secondary | ICD-10-CM | POA: Diagnosis not present

## 2017-06-21 DIAGNOSIS — Z0000 Encounter for general adult medical examination without abnormal findings: Secondary | ICD-10-CM | POA: Diagnosis not present

## 2017-06-21 DIAGNOSIS — E876 Hypokalemia: Secondary | ICD-10-CM

## 2017-06-21 LAB — COMPREHENSIVE METABOLIC PANEL
ALBUMIN: 4.2 g/dL (ref 3.5–5.2)
ALT: 16 U/L (ref 0–35)
AST: 24 U/L (ref 0–37)
Alkaline Phosphatase: 64 U/L (ref 39–117)
BUN: 16 mg/dL (ref 6–23)
CHLORIDE: 100 meq/L (ref 96–112)
CO2: 29 meq/L (ref 19–32)
CREATININE: 1.04 mg/dL (ref 0.40–1.20)
Calcium: 10.1 mg/dL (ref 8.4–10.5)
GFR: 66.4 mL/min (ref >60.00)
Glucose, Bld: 108 mg/dL — ABNORMAL HIGH (ref 70–99)
POTASSIUM: 3.4 meq/L — AB (ref 3.5–5.1)
SODIUM: 138 meq/L (ref 135–145)
Total Bilirubin: 0.7 mg/dL (ref 0.2–1.2)
Total Protein: 7.8 g/dL (ref 6.0–8.3)

## 2017-06-21 LAB — CBC WITH DIFFERENTIAL/PLATELET
BASOS ABS: 0.1 10*3/uL (ref 0.0–0.1)
BASOS PCT: 1 % (ref 0.0–3.0)
EOS ABS: 0.2 10*3/uL (ref 0.0–0.7)
Eosinophils Relative: 2.9 % (ref 0.0–5.0)
HCT: 37.4 % (ref 36.0–46.0)
Hemoglobin: 12.9 g/dL (ref 12.0–15.0)
Lymphocytes Relative: 30.3 % (ref 12.0–46.0)
Lymphs Abs: 2.4 10*3/uL (ref 0.7–4.0)
MCHC: 34.4 g/dL (ref 30.0–36.0)
MCV: 81.8 fl (ref 78.0–100.0)
MONO ABS: 0.6 10*3/uL (ref 0.1–1.0)
Monocytes Relative: 7.6 % (ref 3.0–12.0)
NEUTROS ABS: 4.7 10*3/uL (ref 1.4–7.7)
Neutrophils Relative %: 58.2 % (ref 43.0–77.0)
PLATELETS: 301 10*3/uL (ref 150.0–400.0)
RBC: 4.57 Mil/uL (ref 3.87–5.11)
RDW: 14.2 % (ref 11.5–15.5)
WBC: 8.1 10*3/uL (ref 4.0–10.5)

## 2017-06-21 LAB — TSH: TSH: 2.25 u[IU]/mL (ref 0.35–4.50)

## 2017-06-21 MED ORDER — ACCU-CHEK FASTCLIX LANCETS MISC: 1 refills | Status: DC

## 2017-06-21 NOTE — Progress Notes (Signed)
PCP notes:   Health maintenance:  No gaps identified.  Abnormal screenings:   None  Patient concerns:   None  Nurse concerns:  None  Next PCP appt:   06/25/2017 @ 0930  I reviewed health advisor's note, was available for consultation, and agree with documentation and plan. Loura Pardon MD

## 2017-06-21 NOTE — Telephone Encounter (Signed)
Rx sent 

## 2017-06-21 NOTE — Telephone Encounter (Signed)
Patient in office today for AWV.  Has order for AccuCheck Multiclix lancets but needs AccuCheck FastClix lancets for her glucometer. Please send ASAP as patient has one lancet remaining.   Pharmacy of choice is [facility] [phone] [place].

## 2017-06-21 NOTE — Patient Instructions (Signed)
[name] , Thank you for taking time to come for your Medicare Wellness Visit. I appreciate your ongoing commitment to your health goals. Please review the following plan we discussed and let me know if I can assist you in the future.   These are the goals we discussed: Goals    . Increase physical activity     Starting 06/21/2017, I will continue to exercise for 30 minutes 2 days per week.        This is a list of the screening recommended for you and due dates:  Health Maintenance  Topic Date Due  . Pap Smear  03/20/2020*  . Flu Shot  09/16/2017  . Hemoglobin A1C  09/30/2017  . Eye exam for diabetics  11/23/2017  . Complete foot exam   04/02/2018  . Mammogram  05/29/2018  . Colon Cancer Screening  06/17/2019  . Tetanus Vaccine  07/12/2023  . DEXA scan (bone density measurement)  Completed  . Pneumonia vaccines  Completed  *Topic was postponed. The date shown is not the original due date.   Preventive Care for Adults  A healthy lifestyle and preventive care can promote health and wellness. Preventive health guidelines for adults include the following key practices.  . A routine yearly physical is a good way to check with your health care provider about your health and preventive screening. It is a chance to share any concerns and updates on your health and to receive a thorough exam.  . Visit your dentist for a routine exam and preventive care every 6 months. Brush your teeth twice a day and floss once a day. Good oral hygiene prevents tooth decay and gum disease.  . The frequency of eye exams is based on your age, health, family medical history, use  of contact lenses, and other factors. Follow your health care provider's recommendations for frequency of eye exams.  . Eat a healthy diet. Foods like vegetables, fruits, whole grains, low-fat dairy products, and lean protein foods contain the nutrients you need without too many calories. Decrease your intake of foods high in solid  fats, added sugars, and salt. Eat the right amount of calories for you. Get information about a proper diet from your health care provider, if necessary.  . Regular physical exercise is one of the most important things you can do for your health. Most adults should get at least 150 minutes of moderate-intensity exercise (any activity that increases your heart rate and causes you to sweat) each week. In addition, most adults need muscle-strengthening exercises on 2 or more days a week.  Silver Sneakers may be a benefit available to you. To determine eligibility, you may visit the website: www.silversneakers.com or contact program at 210-767-4391 Mon-Fri between 8AM-8PM.   . Maintain a healthy weight. The body mass index (BMI) is a screening tool to identify possible weight problems. It provides an estimate of body fat based on height and weight. Your health care provider can find your BMI and can help you achieve or maintain a healthy weight.   For adults 20 years and older: ? A BMI below 18.5 is considered underweight. ? A BMI of 18.5 to 24.9 is normal. ? A BMI of 25 to 29.9 is considered overweight. ? A BMI of 30 and above is considered obese.   . Maintain normal blood lipids and cholesterol levels by exercising and minimizing your intake of saturated fat. Eat a balanced diet with plenty of fruit and vegetables. Blood tests for lipids  and cholesterol should begin at age 72 and be repeated every 5 years. If your lipid or cholesterol levels are high, you are over 50, or you are at high risk for heart disease, you may need your cholesterol levels checked more frequently. Ongoing high lipid and cholesterol levels should be treated with medicines if diet and exercise are not working.  . If you smoke, find out from your health care provider how to quit. If you do not use tobacco, please do not start.  . If you choose to drink alcohol, please do not consume more than 2 drinks per day. One drink is  considered to be 12 ounces (355 mL) of beer, 5 ounces (148 mL) of wine, or 1.5 ounces (44 mL) of liquor.  . If you are 28-42 years old, ask your health care provider if you should take aspirin to prevent strokes.  . Use sunscreen. Apply sunscreen liberally and repeatedly throughout the day. You should seek shade when your shadow is shorter than you. Protect yourself by wearing long sleeves, pants, a wide-brimmed hat, and sunglasses year round, whenever you are outdoors.  . Once a month, do a whole body skin exam, using a mirror to look at the skin on your back. Tell your health care provider of new moles, moles that have irregular borders, moles that are larger than a pencil eraser, or moles that have changed in shape or color.

## 2017-06-21 NOTE — Progress Notes (Signed)
Subjective:   [name] is a 77 y.o. female who presents for Medicare Annual (Subsequent) preventive examination.  Review of Systems:  N/A Cardiac Risk Factors include: advanced age (>95mn, >>75women);diabetes mellitus;dyslipidemia;hypertension     Objective:     Vitals: BP 122/72 (BP Location: Right Arm, Patient Position: Sitting, Cuff Size: Normal)   Pulse 72   Temp 98.6 F (37 C) (Oral)   Ht 5' (1.524 m) Comment: no shoes  Wt 140 lb (63.5 kg)   SpO2 98%   BMI 27.34 kg/m   Body mass index is 27.34 kg/m.  Advanced Directives 06/21/2017 06/18/2016  Does Patient Have a Medical Advance Directive? No Yes  Type of [place]  [place]? - No - copy requested  Would patient like information on creating a medical advance directive? No - Patient declined -    Tobacco Social History   Tobacco Use  Smoking Status Never Smoker  Smokeless Tobacco Never Used     Counseling given: No   Clinical Intake:  Pre-visit preparation completed: Yes  Pain : No/denies pain Pain Score: 0-No pain     Nutritional Status: BMI of 19-24  Normal Nutritional Risks: None Diabetes: No  How often do you need to have someone help you when you read instructions, pamphlets, or other written materials from your doctor or pharmacy?: 1 - Never What is the last grade level you completed in school?: 12th grade  Interpreter Needed?: No  Comments: pt is a widow and lives alone Information entered by :: LPinson, LPN  Past Medical History:  Diagnosis Date  . 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 Fibroids   . Gallstones    incidental asymptomatic gallstones  . Heart murmur   . Hyperlipidemia   . Hypertension    History reviewed. No pertinent surgical history. Family History  Problem Relation Age of Onset  . Hypertension Mother   . Diabetes Mother   . Osteoporosis Sister   . Diabetes Brother     Social History   Socioeconomic History  . Marital status: Married    Spouse name: Not on file  . Number of children: Not on file  . Years of education: Not on file  . Highest education level: Not on file  Occupational History  . Not on file  Social Needs  . Financial resource strain: Not on file  . Food insecurity:    Worry: Not on file    Inability: Not on file  . Transportation needs:    Medical: Not on file    Non-medical: Not on file  Tobacco Use  . Smoking status: Never Smoker  . Smokeless tobacco: Never Used  Substance and Sexual Activity  . Alcohol use: No    Alcohol/week: 0.0 oz  . Drug use: No  . Sexual activity: Not Currently  Lifestyle  . Physical activity:    Days per week: Not on file    Minutes per session: Not on file  . Stress: Not on file  Relationships  . Social connections:    Talks on phone: Not on file    Gets together: Not on file    Attends religious service: Not on file    Active member of club or organization: Not on file    Attends meetings of clubs or organizations: Not on file    Relationship status: Not on file  Other Topics Concern  . Not on  file  Social History Narrative  . Not on file    Outpatient Encounter Medications as of 06/21/2017  Medication Sig  . ACCU-CHEK AVIVA PLUS test strip USE AS DIRECTED TO CHECK BLOOD SUGARS ONCE DAILY  . aspirin 81 MG tablet Take 81 mg by mouth daily.  Marland Kitchen atorvastatin (LIPITOR) 10 MG tablet Take 1 tablet (10 mg total) by mouth daily. In evening  . Blood Glucose Monitoring Suppl (ACCU-CHEK AVIVA PLUS) W/DEVICE KIT Check glucose once daily and as needed for DM 250.0   . cetirizine (ZYRTEC) 10 MG tablet Take 1 tablet (10 mg total) by mouth daily.  . cholecalciferol (VITAMIN D) 1000 UNITS tablet Take 1,000 Units by mouth daily.  . cyclobenzaprine (FLEXERIL) 5 MG tablet Take 0.5-1 tablets (2.5-5 mg total) by mouth 3 (three) times daily as needed for muscle spasms. Do not mix with narcotics. May cause  drowsiness.  . hydrochlorothiazide (HYDRODIURIL) 25 MG tablet Take 1 tablet (25 mg total) by mouth every morning.  . hydrocortisone valerate ointment (WESTCORT) 0.2 % Apply 1 application topically 2 (two) times daily.  Marland Kitchen lisinopril (PRINIVIL,ZESTRIL) 40 MG tablet Take 1 tablet (40 mg total) by mouth daily.  . Multiple Vitamin (MULTIVITAMIN) tablet Take 1 tablet by mouth daily.    . potassium chloride SA (K-DUR,KLOR-CON) 20 MEQ tablet Take 1 tablet (20 mEq total) by mouth daily.  . [DISCONTINUED] Lancets (ACCU-CHEK MULTICLIX) lancets Check glucose once daily (Dx. E11.9) (Patient not taking: Reported on 06/21/2017)   No facility-administered encounter medications on file as of 06/21/2017.     Activities of Daily Living In your present state of health, do you have any difficulty performing the following activities: 06/21/2017  Hearing? N  Vision? N  Difficulty concentrating or making decisions? N  Walking or climbing stairs? N  Dressing or bathing? N  Doing errands, shopping? N  Preparing Food and eating ? N  Using the Toilet? N  In the past six months, have you accidently leaked urine? Y  Do you have problems with loss of bowel control? N  Managing your Medications? N  Managing your Finances? N  Housekeeping or managing your Housekeeping? N  Some recent data might be hidden    Patient Care Team: Tower, Wynelle Fanny, MD as PCP - General    Assessment:   This is a routine wellness examination for Brian[place].  Hearing Screening   _0  _1  _2  _3  _4  _5  _6  _7  _8   Right ear:   40 40 40  40    Left ear:   40 40 40  40    Vision Screening Comments: Last vision exam in Aug 2018 with Dr. George Ina   Exercise Activities and Dietary recommendations Current Exercise Habits: Home exercise routine, Type of exercise: Other - see comments(stationary bike), Time (Minutes): 30, Frequency (Times/Week): 2, Weekly Exercise (Minutes/Week): 60, Intensity: Moderate, Exercise limited by:  None identified  Goals    . Increase physical activity     Starting 06/21/2017, I will continue to exercise for 30 minutes 2 days per week.        Fall Risk Fall Risk  06/21/2017 06/18/2016 04/05/2015 03/21/2014 03/07/2013  Falls in the past year? _9    Depression Screen PHQ 2/9 Scores 06/21/2017 06/18/2016 04/05/2015 03/21/2014  PHQ - 2 Score 0 0 0 0  PHQ- 9 Score 0 - - -     Cognitive Function MMSE - Mini Mental State Exam 06/21/2017 06/18/2016  Orientation to time 5 5  Orientation to  Place 5 5  Registration 3 3  Attention/ Calculation 0 0  Recall 3 3  Language- name 2 objects 0 0  Language- repeat 1 1  Language- follow 3 step command 3 3  Language- read & follow direction 0 0  Write a sentence 0 0  Copy design 0 0  Total score 20 20     PLEASE NOTE: A Mini-Cog screen was completed. Maximum score is 20. A value of 0 denotes this part of [place]stein MMSE was not completed or the patient failed this part of the Mini-Cog screening.   Mini-Cog Screening Orientation to Time - Max 5 pts Orientation to Place - Max 5 pts Registration - Max 3 pts Recall - Max 3 pts Language Repeat - Max 1 pts Language Follow 3 Step Command - Max 3 pts     Immunization History  Administered Date(s) Administered  . Influenza Split 11/29/2010  . Influenza Whole 11/04/2011  . Influenza, Quadrivalent, Recombinant, Inj, Pf 10/31/2016  . Influenza,inj,quad, With Preservative 10/18/2015  . Influenza-Unspecified 11/12/2012, 11/16/2013, 11/21/2013, 11/15/2014, 11/01/2015  . Pneumococcal Conjugate-13 03/21/2014  . Pneumococcal Polysaccharide-23 08/14/2008  . Td 06/06/2003, 07/11/2013  . Tdap 07/11/2013  . Zoster 05/23/2014    Screening Tests Health Maintenance  Topic Date Due  . PAP SMEAR  03/20/2020 (Originally 02/25/2012)  . INFLUENZA VACCINE  09/16/2017  . HEMOGLOBIN A1C  09/30/2017  . OPHTHALMOLOGY EXAM  11/23/2017  . FOOT EXAM  04/02/2018  . MAMMOGRAM  05/29/2018  . COLONOSCOPY  06/17/2019    . TETANUS/TDAP  07/12/2023  . DEXA SCAN  Completed  . PNA vac Low Risk Adult  Completed       Plan:     I have personally reviewed, addressed, and noted the following in the patient's chart:  A. Medical and social history B. Use of alcohol, tobacco or illicit drugs  C. Current medications and supplements D. Functional ability and status E.  Nutritional status F.  Physical activity G. Advance directives H. List of other physicians I.  Hospitalizations, surgeries, and ER visits in previous 12 months J.  [place] to include hearing, vision, cognitive, depression L. Referrals and appointments - none  In addition, I have reviewed and discussed with patient certain preventive protocols, quality metrics, and best practice recommendations. A written personalized care plan for preventive services as well as general preventive health recommendations were provided to patient.  See attached scanned questionnaire for additional information.   Signed,   Lindell Noe, MHA, BS, LPN Health Coach

## 2017-06-25 ENCOUNTER — Encounter: Payer: Self-pay | Admitting: Family Medicine

## 2017-06-25 ENCOUNTER — Ambulatory Visit (INDEPENDENT_AMBULATORY_CARE_PROVIDER_SITE_OTHER): Payer: Medicare Other | Admitting: Family Medicine

## 2017-06-25 VITALS — BP 130/74 | HR 69 | Temp 98.0°F | Ht 60.0 in | Wt 141.2 lb

## 2017-06-25 DIAGNOSIS — E7800 Pure hypercholesterolemia, unspecified: Secondary | ICD-10-CM | POA: Diagnosis not present

## 2017-06-25 DIAGNOSIS — I10 Essential (primary) hypertension: Secondary | ICD-10-CM | POA: Diagnosis not present

## 2017-06-25 DIAGNOSIS — E119 Type 2 diabetes mellitus without complications: Secondary | ICD-10-CM

## 2017-06-25 DIAGNOSIS — Z0000 Encounter for general adult medical examination without abnormal findings: Secondary | ICD-10-CM

## 2017-06-25 MED ORDER — ATORVASTATIN CALCIUM 10 MG PO TABS: 10.0000 mg | ORAL_TABLET | Freq: Every day | ORAL | 3 refills | Status: DC

## 2017-06-25 MED ORDER — LISINOPRIL 40 MG PO TABS: 40.0000 mg | ORAL_TABLET | Freq: Every day | ORAL | 3 refills | Status: DC

## 2017-06-25 MED ORDER — HYDROCHLOROTHIAZIDE 25 MG PO TABS: 25.0000 mg | ORAL_TABLET | Freq: Every morning | ORAL | 3 refills | Status: DC

## 2017-06-25 MED ORDER — POTASSIUM CHLORIDE CRYS ER 20 MEQ PO TBCR: 20.0000 meq | EXTENDED_RELEASE_TABLET | Freq: Every day | ORAL | 3 refills | Status: DC

## 2017-06-25 NOTE — Progress Notes (Signed)
Subjective:    Patient ID: [name], female    DOB: 07/09/1940, 77 y.o.   MRN: [id number]  HPI  Here for health maintenance exam and to review chronic medical problems    Doing well  Feels fine    Had amw 5/6 No concerns  Wt Readings from Last 3 Encounters:  06/25/17 141 lb 4 oz (64.1 kg)  06/21/17 140 lb (63.5 kg)  04/02/17 142 lb 1.9 oz (64.5 kg)  down a few lb from last year  27.59 kg/m   Eye exam 10/18 Mammogram 4/19- normal  Self breast exam -no lumps   Colonoscopy 5/11- no abn (but had poor prep)  cologuard neg 5/18    dexa 3/17-normal range  bmd  No falls or fx On ca and D  bp is stable today  No cp or palpitations or headaches or edema  No side effects to medicines  BP Readings from Last 3 Encounters:  06/25/17 130/74  06/21/17 122/72  06/10/17 (!) 141/79      Zostavax 4/16 Had a local reaction to it - she does not want shingrix   DM2 Lab Results  Component Value Date   HGBA1C 6.8 (H) 04/02/2017  still eating healthy for the most part  [facility]  Some walking outdoors and also push mows the lawn   Hyperlipidemia Lab Results  Component Value Date   CHOL 174 04/02/2017   CHOL 166 06/18/2016   CHOL 165 08/16/2015   Lab Results  Component Value Date   HDL 75.70 04/02/2017   HDL 73.60 06/18/2016   HDL 73.60 08/16/2015   Lab Results  Component Value Date   LDLCALC 83 04/02/2017   LDLCALC 69 06/18/2016   LDLCALC 74 08/16/2015   Lab Results  Component Value Date   TRIG 75.0 04/02/2017   TRIG 113.0 06/18/2016   TRIG 86.0 08/16/2015   Lab Results  Component Value Date   CHOLHDL 2 04/02/2017   CHOLHDL 2 06/18/2016   CHOLHDL 2 08/16/2015   Lab Results  Component Value Date   LDLDIRECT 111.2 02/28/2013   LDLDIRECT 122.4 02/23/2012   LDLDIRECT 110.3 08/24/2011  atorvastatin and diet  occ misses a dose if she falls asleep  Tries hard to stay away from fried foods and fatty Vivi Ferns foods   Lab Results    Component Value Date   CREATININE 1.04 06/21/2017   BUN 16 06/21/2017   NA 138 06/21/2017   K 3.4 (L) 06/21/2017   CL 100 06/21/2017   CO2 29 06/21/2017    Lab Results  Component Value Date   ALT 16 06/21/2017   AST 24 06/21/2017   ALKPHOS 64 06/21/2017   BILITOT 0.7 06/21/2017    Lab Results  Component Value Date   WBC 8.1 06/21/2017   HGB 12.9 06/21/2017   HCT 37.4 06/21/2017   MCV 81.8 06/21/2017   PLT 301.0 06/21/2017   Lab Results  Component Value Date   TSH 2.25 06/21/2017     Patient Active Problem List   Diagnosis Date Noted  . Leg pain, right 09/13/2015  . Routine general medical examination at a health care facility 04/05/2015  . Estrogen deficiency 04/05/2015  . Hypokalemia 09/04/2013  . Nonspecific abnormal electrocardiogram (ECG) (EKG) 08/30/2013  . Encounter for Medicare annual wellness exam 03/07/2013  . Fullness of supraclavicular fossa 02/27/2011  . Other screening mammogram 02/25/2011  . Gynecological examination 02/25/2011  . CERVICAL RADICULOPATHY, LEFT 03/31/2010  . DM type 2 (  diabetes mellitus, type 2) ([place]) 07/06/2007  . FIBROIDS, UTERUS 03/23/2007  . HYPERCHOLESTEROLEMIA 03/23/2007  . Essential hypertension 03/23/2007  . POSTMENOPAUSAL STATUS 03/23/2007  . MURMUR 03/23/2007   Past Medical History:  Diagnosis Date  . 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 Fibroids   . Gallstones    incidental asymptomatic gallstones  . Heart murmur   . Hyperlipidemia   . Hypertension    History reviewed. No pertinent surgical history. Social History   Tobacco Use  . Smoking status: Never Smoker  . Smokeless tobacco: Never Used  Substance Use Topics  . Alcohol use: No    Alcohol/week: 0.0 oz  . Drug use: No   Family History  Problem Relation Age of Onset  . Hypertension Mother   . Diabetes Mother   . Osteoporosis Sister   . Diabetes Brother    Allergies  Allergen Reactions  . Ibuprofen     REACTION: palpitations  .  Sulfamethoxazole-Trimethoprim     REACTION: rash   Current Outpatient Medications on File Prior to Visit  Medication Sig Dispense Refill  . ACCU-CHEK AVIVA PLUS test strip USE AS DIRECTED TO CHECK BLOOD SUGARS ONCE DAILY 100 each 1  . ACCU-CHEK FASTCLIX LANCETS MISC Check glucose once daily (Dx. E11.9) 102 each 1  . aspirin 81 MG tablet Take 81 mg by mouth daily.    . Blood Glucose Monitoring Suppl (ACCU-CHEK AVIVA PLUS) W/DEVICE KIT Check glucose once daily and as needed for DM 250.0     . cetirizine (ZYRTEC) 10 MG tablet Take 1 tablet (10 mg total) by mouth daily. 15 tablet 0  . cholecalciferol (VITAMIN D) 1000 UNITS tablet Take 1,000 Units by mouth daily.    . cyclobenzaprine (FLEXERIL) 5 MG tablet Take 0.5-1 tablets (2.5-5 mg total) by mouth 3 (three) times daily as needed for muscle spasms. Do not mix with narcotics. May cause drowsiness. 30 tablet 0  . hydrocortisone valerate ointment (WESTCORT) 0.2 % Apply 1 application topically 2 (two) times daily. 45 g 0  . Multiple Vitamin (MULTIVITAMIN) tablet Take 1 tablet by mouth daily.       No current facility-administered medications on file prior to visit.     Review of Systems  Constitutional: Negative for activity change, appetite change, fatigue, fever and unexpected weight change.  HENT: Negative for congestion, ear pain, rhinorrhea, sinus pressure and sore throat.   Eyes: Negative for pain, redness and visual disturbance.  Respiratory: Negative for cough, shortness of breath and wheezing.   Cardiovascular: Negative for chest pain and palpitations.  Gastrointestinal: Negative for abdominal pain, blood in stool, constipation and diarrhea.  Endocrine: Negative for polydipsia and polyuria.  Genitourinary: Negative for dysuria, frequency and urgency.  Musculoskeletal: Negative for arthralgias, back pain and myalgias.  Skin: Negative for pallor and rash.  Allergic/Immunologic: Negative for environmental allergies.  Neurological:  Negative for dizziness, syncope and headaches.  Hematological: Negative for adenopathy. Does not bruise/bleed easily.  Psychiatric/Behavioral: Negative for decreased concentration and dysphoric mood. The patient is not nervous/anxious.        Objective:   Physical Exam  Constitutional: She appears well-developed and well-nourished. No distress.  HENT:  Head: Normocephalic and atraumatic.  Mouth/Throat: Oropharynx is clear and moist.  Eyes: Pupils are equal, round, and reactive to light. Conjunctivae and EOM are normal.  Neck: Normal range of motion. Neck supple. No JVD present. Carotid bruit is not present. No thyromegaly present.  Cardiovascular: Normal rate, regular rhythm and intact distal pulses. Exam  reveals no gallop.  Murmur heard. Pulmonary/Chest: Effort normal and breath sounds normal. No respiratory distress. She has no wheezes. She has no rales.  No crackles  Abdominal: Soft. Bowel sounds are normal. She exhibits no distension, no abdominal bruit and no mass. There is no tenderness.  Musculoskeletal: She exhibits no edema.  Lymphadenopathy:    She has no cervical adenopathy.  Neurological: She is alert. She has normal reflexes.  Skin: Skin is warm and dry. No rash noted.  Psychiatric: She has a normal mood and affect.  Pleasant           Assessment & Plan:   Problem List Items Addressed This Visit      Cardiovascular and Mediastinum   Essential hypertension    bp in fair control at this time  BP Readings from Last 1 Encounters:  06/25/17 130/74   No changes needed Disc lifstyle change with low sodium diet and exercise  Labs rev       Relevant Medications   atorvastatin (LIPITOR) 10 MG tablet   hydrochlorothiazide (HYDRODIURIL) 25 MG tablet   lisinopril (PRINIVIL,ZESTRIL) 40 MG tablet     Endocrine   DM type 2 (diabetes mellitus, type 2) ([facility])    Lab Results  Component Value Date   HGBA1C 6.8 (H) 04/02/2017   Stable  Has been well controlled with  diet  Continue to work on this  Disc eye and foot care       Relevant Medications   atorvastatin (LIPITOR) 10 MG tablet   lisinopril (PRINIVIL,ZESTRIL) 40 MG tablet     Other   HYPERCHOLESTEROLEMIA    Disc goals for lipids and reasons to control them Rev labs with pt Rev low sat fat diet in detail  LDL in 80s-enc to be compliant with statin doses       Relevant Medications   atorvastatin (LIPITOR) 10 MG tablet   hydrochlorothiazide (HYDRODIURIL) 25 MG tablet   lisinopril (PRINIVIL,ZESTRIL) 40 MG tablet   Routine general medical examination at a health care facility - Primary    Reviewed health habits including diet and exercise and skin cancer prevention Reviewed appropriate screening tests for age  Also reviewed health mt list, fam hx and immunization status , as well as social and family history   See HPI Labs rev  amw rev  Will not get shingrix- had rxn to zostavax  utd with cologuard  utd eye exam

## 2017-06-25 NOTE — Patient Instructions (Addendum)
Take care of yourself   Keep working on a healthy diabetic diet and low cholesterol as well  Keep exercising  Wear sunscreen outdoors   Try not to miss cholesterol medicine doses    Follow up in 6 months

## 2017-06-27 NOTE — Assessment & Plan Note (Signed)
bp in fair control at this time  BP Readings from Last 1 Encounters:  06/25/17 130/74   No changes needed Disc lifstyle change with low sodium diet and exercise  Labs rev

## 2017-06-27 NOTE — Assessment & Plan Note (Signed)
Lab Results  Component Value Date   HGBA1C 6.8 (H) 04/02/2017   Stable  Has been well controlled with diet  Continue to work on this  Disc eye and foot care

## 2017-06-27 NOTE — Assessment & Plan Note (Signed)
Reviewed health habits including diet and exercise and skin cancer prevention Reviewed appropriate screening tests for age  Also reviewed health mt list, fam hx and immunization status , as well as social and family history   See HPI Labs rev  amw rev  Will not get shingrix- had rxn to zostavax  utd with cologuard  utd eye exam

## 2017-06-27 NOTE — Assessment & Plan Note (Addendum)
Disc goals for lipids and reasons to control them Rev labs with pt Rev low sat fat diet in detail  LDL in 80s-enc to be compliant with statin doses

## 2017-07-03 ENCOUNTER — Other Ambulatory Visit: Payer: Self-pay | Admitting: Family Medicine

## 2017-10-04 ENCOUNTER — Telehealth (INDEPENDENT_AMBULATORY_CARE_PROVIDER_SITE_OTHER): Payer: Medicare Other | Admitting: Family Medicine

## 2017-10-04 ENCOUNTER — Other Ambulatory Visit (INDEPENDENT_AMBULATORY_CARE_PROVIDER_SITE_OTHER): Payer: Medicare Other

## 2017-10-04 DIAGNOSIS — E119 Type 2 diabetes mellitus without complications: Secondary | ICD-10-CM | POA: Diagnosis not present

## 2017-10-04 DIAGNOSIS — E7800 Pure hypercholesterolemia, unspecified: Secondary | ICD-10-CM

## 2017-10-04 DIAGNOSIS — I10 Essential (primary) hypertension: Secondary | ICD-10-CM | POA: Diagnosis not present

## 2017-10-04 LAB — COMPREHENSIVE METABOLIC PANEL
ALT: 15 U/L (ref 0–35)
AST: 19 U/L (ref 0–37)
Albumin: 4.1 g/dL (ref 3.5–5.2)
Alkaline Phosphatase: 65 U/L (ref 39–117)
BILIRUBIN TOTAL: 0.6 mg/dL (ref 0.2–1.2)
BUN: 14 mg/dL (ref 6–23)
CALCIUM: 10.3 mg/dL (ref 8.4–10.5)
CO2: 29 mEq/L (ref 19–32)
Chloride: 102 mEq/L (ref 96–112)
Creatinine, Ser: 1.15 mg/dL (ref 0.40–1.20)
GFR: 59.08 mL/min — AB (ref >60.00)
GLUCOSE: 112 mg/dL — AB (ref 70–99)
POTASSIUM: 3.5 meq/L (ref 3.5–5.1)
Sodium: 140 mEq/L (ref 135–145)
TOTAL PROTEIN: 7.6 g/dL (ref 6.0–8.3)

## 2017-10-04 LAB — LIPID PANEL
CHOLESTEROL: 148 mg/dL (ref 0–200)
HDL: 74.6 mg/dL (ref >39.00)
LDL Cholesterol: 58 mg/dL (ref 0–99)
NONHDL: 73.16
TRIGLYCERIDES: 76 mg/dL (ref 0.0–149.0)
Total CHOL/HDL Ratio: 2
VLDL: 15.2 mg/dL (ref 0.0–40.0)

## 2017-10-04 LAB — HEMOGLOBIN A1C: Hgb A1c MFr Bld: 6.7 % — ABNORMAL HIGH (ref 4.6–6.5)

## 2017-10-04 NOTE — Telephone Encounter (Signed)
Lab order

## 2017-10-11 ENCOUNTER — Encounter: Payer: Self-pay | Admitting: Family Medicine

## 2017-10-11 ENCOUNTER — Ambulatory Visit (INDEPENDENT_AMBULATORY_CARE_PROVIDER_SITE_OTHER): Payer: Medicare Other | Admitting: Family Medicine

## 2017-10-11 VITALS — BP 136/78 | HR 71 | Temp 97.8°F | Ht 60.0 in | Wt 140.8 lb

## 2017-10-11 DIAGNOSIS — E7800 Pure hypercholesterolemia, unspecified: Secondary | ICD-10-CM | POA: Diagnosis not present

## 2017-10-11 DIAGNOSIS — E119 Type 2 diabetes mellitus without complications: Secondary | ICD-10-CM

## 2017-10-11 DIAGNOSIS — I10 Essential (primary) hypertension: Secondary | ICD-10-CM

## 2017-10-11 NOTE — Assessment & Plan Note (Signed)
bp in fair control at this time  BP Readings from Last 1 Encounters:  10/11/17 136/78   No changes needed Most recent labs reviewed  Disc lifstyle change with low sodium diet and exercise

## 2017-10-11 NOTE — Patient Instructions (Addendum)
I think you are due for next eye exam in October   [facility]   Don't forget to get your flu shot in the fall   We will see you back on May

## 2017-10-11 NOTE — Assessment & Plan Note (Signed)
Lab Results  Component Value Date   HGBA1C 6.7 (H) 10/04/2017   This is stable Diet control  Will schedule eye exam for oct  Flu shot in the fall  F/u 6 mo

## 2017-10-11 NOTE — Progress Notes (Signed)
Subjective:    Patient ID: [name], female    DOB: 1940-04-23, 77 y.o.   MRN: [id number]  HPI  Here for f/u of chronic health problems   Feeling good  Taking care of herself   Wt Readings from Last 3 Encounters:  10/11/17 140 lb 12.8 oz (63.9 kg)  06/25/17 141 lb 4 oz (64.1 kg)  06/21/17 140 lb (63.5 kg)  stable  27.50 kg/m   bp is stable today  No cp or palpitations or headaches or edema  No side effects to medicines  BP Readings from Last 3 Encounters:  10/11/17 136/78  06/25/17 130/74  06/21/17 122/72     DM2 Lab Results  Component Value Date   HGBA1C 6.7 (H) 10/04/2017   This is down from 6.8 Diet control - is careful not to eat sweets (has a bit occasionally)- tries not to eat candy  Loves bread- but cutting back  Eye exam 10/18 Glucose 112 No big ups or downs in glucose at home  A [facility] with [facility]- [facility]  occ tingling in hands   Cholesterol  Lab Results  Component Value Date   CHOL 148 10/04/2017   CHOL 174 04/02/2017   CHOL 166 06/18/2016   Lab Results  Component Value Date   HDL 74.60 10/04/2017   HDL 75.70 04/02/2017   HDL 73.60 06/18/2016   Lab Results  Component Value Date   LDLCALC 58 10/04/2017   LDLCALC 83 04/02/2017   LDLCALC 69 06/18/2016   Lab Results  Component Value Date   TRIG 76.0 10/04/2017   TRIG 75.0 04/02/2017   TRIG 113.0 06/18/2016   Lab Results  Component Value Date   CHOLHDL 2 10/04/2017   CHOLHDL 2 04/02/2017   CHOLHDL 2 06/18/2016   Lab Results  Component Value Date   LDLDIRECT 111.2 02/28/2013   LDLDIRECT 122.4 02/23/2012   LDLDIRECT 110.3 08/24/2011   atorvastatin and diet  Does watch her diet  Grilled foods instead of fried    Patient Active Problem List   Diagnosis Date Noted  . Routine general medical examination at a health care facility 04/05/2015  . Estrogen deficiency 04/05/2015  . Hypokalemia 09/04/2013  . Nonspecific abnormal electrocardiogram (ECG) (EKG) 08/30/2013  .  Encounter for Medicare annual wellness exam 03/07/2013  . Fullness of supraclavicular fossa 02/27/2011  . Other screening mammogram 02/25/2011  . Gynecological examination 02/25/2011  . CERVICAL RADICULOPATHY, LEFT 03/31/2010  . DM type 2 (diabetes mellitus, type 2) ([place]) 07/06/2007  . FIBROIDS, UTERUS 03/23/2007  . HYPERCHOLESTEROLEMIA 03/23/2007  . Essential hypertension 03/23/2007  . POSTMENOPAUSAL STATUS 03/23/2007  . MURMUR 03/23/2007   Past Medical History:  Diagnosis Date  . 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 Fibroids   . Gallstones    incidental asymptomatic gallstones  . Heart murmur   . Hyperlipidemia   . Hypertension    History reviewed. No pertinent surgical history. Social History   Tobacco Use  . Smoking status: Never Smoker  . Smokeless tobacco: Never Used  Substance Use Topics  . Alcohol use: No    Alcohol/week: 0.0 standard drinks  . Drug use: No   Family History  Problem Relation Age of Onset  . Hypertension Mother   . Diabetes Mother   . Osteoporosis Sister   . Diabetes Brother    Allergies  Allergen Reactions  . Ibuprofen     REACTION: palpitations  . Sulfamethoxazole-Trimethoprim     REACTION: rash   Current Outpatient  Medications on File Prior to Visit  Medication Sig Dispense Refill  . ACCU-CHEK FASTCLIX LANCETS MISC Check glucose once daily (Dx. E11.9) 102 each 1  . aspirin 81 MG tablet Take 81 mg by mouth daily.    Marland Kitchen atorvastatin (LIPITOR) 10 MG tablet Take 1 tablet (10 mg total) by mouth daily. In evening 90 tablet 3  . Blood Glucose Monitoring Suppl (ACCU-CHEK AVIVA PLUS) W/DEVICE KIT Check glucose once daily and as needed for DM 250.0     . cetirizine (ZYRTEC) 10 MG tablet Take 1 tablet (10 mg total) by mouth daily. 15 tablet 0  . cholecalciferol (VITAMIN D) 1000 UNITS tablet Take 1,000 Units by mouth daily.    . cyclobenzaprine (FLEXERIL) 5 MG tablet Take 0.5-1 tablets (2.5-5 mg total) by mouth 3 (three) times daily as needed for  muscle spasms. Do not mix with narcotics. May cause drowsiness. 30 tablet 0  . glucose blood (ACCU-CHEK AVIVA PLUS) test strip Use to check blood sugar once daliy (dx. E11.9) 100 each 1  . hydrochlorothiazide (HYDRODIURIL) 25 MG tablet Take 1 tablet (25 mg total) by mouth every morning. 90 tablet 3  . hydrocortisone valerate ointment (WESTCORT) 0.2 % Apply 1 application topically 2 (two) times daily. 45 g 0  . lisinopril (PRINIVIL,ZESTRIL) 40 MG tablet Take 1 tablet (40 mg total) by mouth daily. 90 tablet 3  . Multiple Vitamin (MULTIVITAMIN) tablet Take 1 tablet by mouth daily.      . potassium chloride SA (K-DUR,KLOR-CON) 20 MEQ tablet Take 1 tablet (20 mEq total) by mouth daily. 90 tablet 3   No current facility-administered medications on file prior to visit.      Review of Systems  Constitutional: Negative for activity change, appetite change, fatigue, fever and unexpected weight change.  HENT: Negative for congestion, ear pain, rhinorrhea, sinus pressure and sore throat.   Eyes: Negative for pain, redness and visual disturbance.  Respiratory: Negative for cough, shortness of breath and wheezing.   Cardiovascular: Negative for chest pain and palpitations.  Gastrointestinal: Negative for abdominal pain, blood in stool, constipation and diarrhea.  Endocrine: Negative for polydipsia and polyuria.  Genitourinary: Negative for dysuria, frequency and urgency.  Musculoskeletal: Negative for arthralgias, back pain and myalgias.  Skin: Negative for pallor and rash.  Allergic/Immunologic: Negative for environmental allergies.  Neurological: Negative for dizziness, syncope and headaches.  Hematological: Negative for adenopathy. Does not bruise/bleed easily.  Psychiatric/Behavioral: Negative for decreased concentration and dysphoric mood. The patient is not nervous/anxious.        Objective:   Physical Exam  Constitutional: She appears well-developed and well-nourished. No distress.  Well  appearing   HENT:  Head: Normocephalic and atraumatic.  Mouth/Throat: Oropharynx is clear and moist.  Eyes: Pupils are equal, round, and reactive to light. Conjunctivae and EOM are normal.  Neck: Normal range of motion. Neck supple. No JVD present. Carotid bruit is not present. No thyromegaly present.  Cardiovascular: Normal rate, regular rhythm and intact distal pulses. Exam reveals no gallop.  Murmur heard. Pulmonary/Chest: Effort normal and breath sounds normal. No respiratory distress. She has no wheezes. She has no rales.  No crackles  Abdominal: Soft. Bowel sounds are normal. She exhibits no distension, no abdominal bruit and no mass. There is no tenderness.  Musculoskeletal: She exhibits no edema.  Lymphadenopathy:    She has no cervical adenopathy.  Neurological: She is alert. She has normal reflexes. She displays normal reflexes. No cranial nerve deficit. Coordination normal.  Skin: Skin is warm  and dry. No rash noted.  Psychiatric: She has a normal mood and affect.          Assessment & Plan:   Problem List Items Addressed This Visit      Cardiovascular and Mediastinum   Essential hypertension - Primary    bp in fair control at this time  BP Readings from Last 1 Encounters:  10/11/17 136/78   No changes needed Most recent labs reviewed  Disc lifstyle change with low sodium diet and exercise          Endocrine   DM type 2 (diabetes mellitus, type 2) ([place])    Lab Results  Component Value Date   HGBA1C 6.7 (H) 10/04/2017   This is stable Diet control  Will schedule eye exam for oct  Flu shot in the fall  F/u 6 mo        Other   HYPERCHOLESTEROLEMIA    Disc goals for lipids and reasons to control them Rev last labs with pt Rev low sat fat diet in detail Continue atorvastatin and diet  Well controlled

## 2017-10-11 NOTE — Assessment & Plan Note (Signed)
Disc goals for lipids and reasons to control them Rev last labs with pt Rev low sat fat diet in detail Continue atorvastatin and diet  Well controlled

## 2017-12-29 ENCOUNTER — Other Ambulatory Visit: Payer: Self-pay | Admitting: *Deleted

## 2017-12-29 MED ORDER — GLUCOSE BLOOD VI STRP: ORAL_STRIP | 1 refills | Status: DC

## 2018-01-07 DIAGNOSIS — E119 Type 2 diabetes mellitus without complications: Secondary | ICD-10-CM | POA: Diagnosis not present

## 2018-01-07 LAB — HM DIABETES EYE EXAM

## 2018-02-01 ENCOUNTER — Encounter: Payer: Self-pay | Admitting: Family Medicine

## 2018-06-23 ENCOUNTER — Telehealth: Payer: Self-pay | Admitting: Family Medicine

## 2018-06-23 DIAGNOSIS — I10 Essential (primary) hypertension: Secondary | ICD-10-CM

## 2018-06-23 DIAGNOSIS — E7800 Pure hypercholesterolemia, unspecified: Secondary | ICD-10-CM

## 2018-06-23 DIAGNOSIS — E119 Type 2 diabetes mellitus without complications: Secondary | ICD-10-CM

## 2018-06-23 NOTE — Telephone Encounter (Signed)
-----   Message from Cloyd Stagers, RT sent at 06/20/2018  1:16 PM EDT ----- Regarding: Lab Orders for Friday 5.8.2020 Please place lab orders for Friday 5.8.2020, [facility] visit on Tuesday 5.12.2020 Thank you, Dyke Maes RT(R)

## 2018-06-24 ENCOUNTER — Ambulatory Visit (INDEPENDENT_AMBULATORY_CARE_PROVIDER_SITE_OTHER): Payer: Medicare Other

## 2018-06-24 ENCOUNTER — Ambulatory Visit (INDEPENDENT_AMBULATORY_CARE_PROVIDER_SITE_OTHER): Payer: Medicare Other | Admitting: Family Medicine

## 2018-06-24 ENCOUNTER — Other Ambulatory Visit (INDEPENDENT_AMBULATORY_CARE_PROVIDER_SITE_OTHER): Payer: Medicare Other

## 2018-06-24 ENCOUNTER — Other Ambulatory Visit: Payer: Self-pay

## 2018-06-24 ENCOUNTER — Encounter: Payer: Self-pay | Admitting: Family Medicine

## 2018-06-24 DIAGNOSIS — M79604 Pain in right leg: Secondary | ICD-10-CM | POA: Diagnosis not present

## 2018-06-24 DIAGNOSIS — I10 Essential (primary) hypertension: Secondary | ICD-10-CM

## 2018-06-24 DIAGNOSIS — E119 Type 2 diabetes mellitus without complications: Secondary | ICD-10-CM | POA: Diagnosis not present

## 2018-06-24 DIAGNOSIS — Z0000 Encounter for general adult medical examination without abnormal findings: Secondary | ICD-10-CM

## 2018-06-24 DIAGNOSIS — E7800 Pure hypercholesterolemia, unspecified: Secondary | ICD-10-CM | POA: Diagnosis not present

## 2018-06-24 LAB — COMPREHENSIVE METABOLIC PANEL
ALT: 22 U/L (ref 0–35)
AST: 26 U/L (ref 0–37)
Albumin: 4.4 g/dL (ref 3.5–5.2)
Alkaline Phosphatase: 73 U/L (ref 39–117)
BUN: 12 mg/dL (ref 6–23)
CO2: 27 mEq/L (ref 19–32)
Calcium: 10 mg/dL (ref 8.4–10.5)
Chloride: 97 mEq/L (ref 96–112)
Creatinine, Ser: 1 mg/dL (ref 0.40–1.20)
GFR: 65.19 mL/min (ref >60.00)
Glucose, Bld: 103 mg/dL — ABNORMAL HIGH (ref 70–99)
Potassium: 3.7 mEq/L (ref 3.5–5.1)
Sodium: 136 mEq/L (ref 135–145)
Total Bilirubin: 0.5 mg/dL (ref 0.2–1.2)
Total Protein: 8.1 g/dL (ref 6.0–8.3)

## 2018-06-24 LAB — CBC WITH DIFFERENTIAL/PLATELET
Basophils Absolute: 0.1 10*3/uL (ref 0.0–0.1)
Basophils Relative: 1 % (ref 0.0–3.0)
Eosinophils Absolute: 0.2 10*3/uL (ref 0.0–0.7)
Eosinophils Relative: 2.1 % (ref 0.0–5.0)
HCT: 39.7 % (ref 36.0–46.0)
Hemoglobin: 13.8 g/dL (ref 12.0–15.0)
Lymphocytes Relative: 34.3 % (ref 12.0–46.0)
Lymphs Abs: 2.9 10*3/uL (ref 0.7–4.0)
MCHC: 34.6 g/dL (ref 30.0–36.0)
MCV: 81.4 fl (ref 78.0–100.0)
Monocytes Absolute: 0.7 10*3/uL (ref 0.1–1.0)
Monocytes Relative: 8.6 % (ref 3.0–12.0)
Neutro Abs: 4.6 10*3/uL (ref 1.4–7.7)
Neutrophils Relative %: 54 % (ref 43.0–77.0)
Platelets: 319 10*3/uL (ref 150.0–400.0)
RBC: 4.88 Mil/uL (ref 3.87–5.11)
RDW: 14.4 % (ref 11.5–15.5)
WBC: 8.6 10*3/uL (ref 4.0–10.5)

## 2018-06-24 LAB — LIPID PANEL
Cholesterol: 172 mg/dL (ref 0–200)
HDL: 73.4 mg/dL (ref >39.00)
LDL Cholesterol: 72 mg/dL (ref 0–99)
NonHDL: 98.81
Total CHOL/HDL Ratio: 2
Triglycerides: 132 mg/dL (ref 0.0–149.0)
VLDL: 26.4 mg/dL (ref 0.0–40.0)

## 2018-06-24 LAB — TSH: TSH: 5.02 u[IU]/mL — ABNORMAL HIGH (ref 0.35–4.50)

## 2018-06-24 LAB — HEMOGLOBIN A1C: Hgb A1c MFr Bld: 6.8 % — ABNORMAL HIGH (ref 4.6–6.5)

## 2018-06-24 MED ORDER — CYCLOBENZAPRINE HCL 5 MG PO TABS: 2.5000 mg | ORAL_TABLET | Freq: Three times a day (TID) | ORAL | 0 refills | Status: DC | PRN

## 2018-06-24 NOTE — Patient Instructions (Signed)
[name] , Thank you for taking time to come for your Medicare Wellness Visit. I appreciate your ongoing commitment to your health goals. Please review the following plan we discussed and let me know if I can assist you in the future.   These are the goals we discussed: Goals    . Increase physical activity     When schedule permits, I will resume exercise for 30 minutes 2 days per week.        This is a list of the screening recommended for you and due dates:  Health Maintenance  Topic Date Due  . Mammogram  02/16/2019*  . Pap Smear  03/20/2020*  . Flu Shot  09/17/2018  . Complete foot exam   10/12/2018  . Hemoglobin A1C  12/25/2018  . Eye exam for diabetics  01/08/2019  . Tetanus Vaccine  07/12/2023  . DEXA scan (bone density measurement)  Completed  . Pneumonia vaccines  Completed  *Topic was postponed. The date shown is not the original due date.   Preventive Care for Adults  A healthy lifestyle and preventive care can promote health and wellness. Preventive health guidelines for adults include the following key practices.  . A routine yearly physical is a good way to check with your health care provider about your health and preventive screening. It is a chance to share any concerns and updates on your health and to receive a thorough exam.  . Visit your dentist for a routine exam and preventive care every 6 months. Brush your teeth twice a day and floss once a day. Good oral hygiene prevents tooth decay and gum disease.  . The frequency of eye exams is based on your age, health, family medical history, use  of contact lenses, and other factors. Follow your health care provider's recommendations for frequency of eye exams.  . Eat a healthy diet. Foods like vegetables, fruits, whole grains, low-fat dairy products, and lean protein foods contain the nutrients you need without too many calories. Decrease your intake of foods high in solid fats, added sugars, and salt. Eat the  right amount of calories for you. Get information about a proper diet from your health care provider, if necessary.  . Regular physical exercise is one of the most important things you can do for your health. Most adults should get at least 150 minutes of moderate-intensity exercise (any activity that increases your heart rate and causes you to sweat) each week. In addition, most adults need muscle-strengthening exercises on 2 or more days a week.  Silver Sneakers may be a benefit available to you. To determine eligibility, you may visit the website: www.silversneakers.com or contact program at 8027759425 Mon-Fri between 8AM-8PM.   . Maintain a healthy weight. The body mass index (BMI) is a screening tool to identify possible weight problems. It provides an estimate of body fat based on height and weight. Your health care provider can find your BMI and can help you achieve or maintain a healthy weight.   For adults 20 years and older: ? A BMI below 18.5 is considered underweight. ? A BMI of 18.5 to 24.9 is normal. ? A BMI of 25 to 29.9 is considered overweight. ? A BMI of 30 and above is considered obese.   . Maintain normal blood lipids and cholesterol levels by exercising and minimizing your intake of saturated fat. Eat a balanced diet with plenty of fruit and vegetables. Blood tests for lipids and cholesterol should begin at age 65  and be repeated every 5 years. If your lipid or cholesterol levels are high, you are over 50, or you are at high risk for heart disease, you may need your cholesterol levels checked more frequently. Ongoing high lipid and cholesterol levels should be treated with medicines if diet and exercise are not working.  . If you smoke, find out from your health care provider how to quit. If you do not use tobacco, please do not start.  . If you choose to drink alcohol, please do not consume more than 2 drinks per day. One drink is considered to be 12 ounces (355 mL) of  beer, 5 ounces (148 mL) of wine, or 1.5 ounces (44 mL) of liquor.  . If you are 89-10 years old, ask your health care provider if you should take aspirin to prevent strokes.  . Use sunscreen. Apply sunscreen liberally and repeatedly throughout the day. You should seek shade when your shadow is shorter than you. Protect yourself by wearing long sleeves, pants, a wide-brimmed hat, and sunglasses year round, whenever you are outdoors.  . Once a month, do a whole body skin exam, using a mirror to look at the skin on your back. Tell your health care provider of new moles, moles that have irregular borders, moles that are larger than a pencil eraser, or moles that have changed in shape or color.

## 2018-06-24 NOTE — Assessment & Plan Note (Signed)
F/u planned for annual on tues

## 2018-06-24 NOTE — Progress Notes (Signed)
Virtual Visit via Video Note  I connected with [name] on 06/24/18 at 11:15 AM EDT by a video enabled telemedicine application and verified that I am speaking with the correct person using two identifiers.  Location: Patient: home Provider: office  A doxy.me video visit was attempted and failed  The visit is conducted by phone    I discussed the limitations of evaluation and management by telemedicine and the availability of in person appointments. The patient expressed understanding and agreed to proceed.  History of Present Illness: Pt presents with R leg soreness   It started last Thursday  She had been cleaning cabinets (up and down) - step stool   R leg Hurts in middle of her thigh- to the side /outside - about 1/2 way down Sore to the touch  Hurst worse to walk  Not swelling  No bruising  No redness or mass  No warmth to the touch   When she is still it does not bother her - sitting  Standing bothers her after a while  Rotating hips is worse   Back is not bothering her  No buttock pain  No groin pain or abd pain   Cannot take nsaids  Took [employer] acetaminophen (helped a bit)  Did not help a lot  She has tried ice and heat - they both help a little   Has tolerated flexeril in the past    No numbness  No weakness   Review of Systems  Constitutional: Negative for chills, fever, malaise/fatigue and weight loss.  Respiratory: Negative for cough and shortness of breath.   Cardiovascular: Negative for chest pain, palpitations, claudication and leg swelling.  Gastrointestinal: Negative for abdominal pain and nausea.  Musculoskeletal: Positive for myalgias. Negative for back pain and falls.  Skin: Negative for itching and rash.  Neurological: Negative for tingling, sensory change and focal weakness.  Endo/Heme/Allergies: Does not bruise/bleed easily.    Patient Active Problem List   Diagnosis Date Noted  . Routine general medical examination at a  health care facility 04/05/2015  . Estrogen deficiency 04/05/2015  . Hypokalemia 09/04/2013  . Nonspecific abnormal electrocardiogram (ECG) (EKG) 08/30/2013  . Encounter for Medicare annual wellness exam 03/07/2013  . Fullness of supraclavicular fossa 02/27/2011  . Other screening mammogram 02/25/2011  . Gynecological examination 02/25/2011  . CERVICAL RADICULOPATHY, LEFT 03/31/2010  . DM type 2 (diabetes mellitus, type 2) ([place]) 07/06/2007  . FIBROIDS, UTERUS 03/23/2007  . HYPERCHOLESTEROLEMIA 03/23/2007  . Essential hypertension 03/23/2007  . POSTMENOPAUSAL STATUS 03/23/2007  . MURMUR 03/23/2007   Past Medical History:  Diagnosis Date  . 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 Fibroids   . Gallstones    incidental asymptomatic gallstones  . Heart murmur   . Hyperlipidemia   . Hypertension    History reviewed. No pertinent surgical history. Social History   Tobacco Use  . Smoking status: Never Smoker  . Smokeless tobacco: Never Used  Substance Use Topics  . Alcohol use: No    Alcohol/week: 0.0 standard drinks  . Drug use: No   Family History  Problem Relation Age of Onset  . Hypertension Mother   . Diabetes Mother   . Osteoporosis Sister   . Diabetes Brother    Allergies  Allergen Reactions  . Ibuprofen     REACTION: palpitations  . Sulfamethoxazole-Trimethoprim     REACTION: rash   Current Outpatient Medications on File Prior to Visit  Medication Sig Dispense Refill  . ACCU-CHEK  FASTCLIX LANCETS MISC Check glucose once daily (Dx. E11.9) 102 each 1  . aspirin 81 MG tablet Take 81 mg by mouth daily.    Marland Kitchen atorvastatin (LIPITOR) 10 MG tablet Take 1 tablet (10 mg total) by mouth daily. In evening 90 tablet 3  . Blood Glucose Monitoring Suppl (ACCU-CHEK AVIVA PLUS) W/DEVICE KIT Check glucose once daily and as needed for DM 250.0     . cholecalciferol (VITAMIN D) 1000 UNITS tablet Take 1,000 Units by mouth daily.    Marland Kitchen glucose blood (ACCU-CHEK AVIVA PLUS) test strip  Use to check blood sugar once daliy (dx. E11.9) 100 each 1  . hydrochlorothiazide (HYDRODIURIL) 25 MG tablet Take 1 tablet (25 mg total) by mouth every morning. 90 tablet 3  . lisinopril (PRINIVIL,ZESTRIL) 40 MG tablet Take 1 tablet (40 mg total) by mouth daily. 90 tablet 3  . Multiple Vitamin (MULTIVITAMIN) tablet Take 1 tablet by mouth daily.      . potassium chloride SA (K-DUR,KLOR-CON) 20 MEQ tablet Take 1 tablet (20 mEq total) by mouth daily. 90 tablet 3   No current facility-administered medications on file prior to visit.     Observations/Objective: Patient sounds like her usual self  Mentally sharp/good historian Normal affect/pleasant  Not in distress Not hoarse  Not sob with speech and no cough  Assessment and Plan: Problem List Items Addressed This Visit      Endocrine   DM type 2 (diabetes mellitus, type 2) ([place])    F/u planned for annual on tues        Other   RESOLVED: Leg pain, right - Primary    Pt c/o upper/lateral R leg pain -unsure if same pain she had prev Occurred after many hours on a step stool /up and down Per hx sounds like muscle strain vs trochanteric bursitis On acetaminophen (cannot take nsaid)  Ice/heat recommended Relative rest (no climbing) and elevation when able Flexeril (she tol well) with caution of sedation to help pain and spasm  If no imp next week need to examine  Pt will watch for skin change/rash/redness/warmth swelling or worse symptoms in the meantime            Follow Up Instructions: Stop climbing on the step stool or ladder / avoid stairs when you can  Daily activity is ok but rest your leg when you can  When sitting-elevate your leg Also use ice and heat on and off for 10 minutes at a time (whichever works best)  Continue acetaminophen as needed /as directed  I sent in flexeril to use as needed since it has helped in the past  If no improvement next week we will have you in the office for an exam  In the meantime watch  for skin change like rash or redness /bruising or warmth  Watch for swelling or a lump  Watch for back pain or numbness or tingling of legs  Call if symptoms worsen at any time   We will touch base on Tuesday as planned    I discussed the assessment and treatment plan with the patient. The patient was provided an opportunity to ask questions and all were answered. The patient agreed with the plan and demonstrated an understanding of the instructions.   The patient was advised to call back or seek an in-person evaluation if the symptoms worsen or if the condition fails to improve as anticipated.     Loura Pardon, MD

## 2018-06-24 NOTE — Assessment & Plan Note (Signed)
Pt c/o upper/lateral R leg pain -unsure if same pain she had prev Occurred after many hours on a step stool /up and down Per hx sounds like muscle strain vs trochanteric bursitis On acetaminophen (cannot take nsaid)  Ice/heat recommended Relative rest (no climbing) and elevation when able Flexeril (she tol well) with caution of sedation to help pain and spasm  If no imp next week need to examine  Pt will watch for skin change/rash/redness/warmth swelling or worse symptoms in the meantime

## 2018-06-24 NOTE — Progress Notes (Signed)
PCP notes:   Health maintenance:  A1C - completed Mammogram - completed   Abnormal screenings:   None  Patient concerns:   Patient states she may have pulled a muscle in right leg. Same day appt scheduled with PCP.  Nurse concerns:  None  Next PCP appt:   06/24/18 @ 1115  I reviewed health advisor's note, was available for consultation, and agree with documentation and plan. Loura Pardon MD

## 2018-06-24 NOTE — Patient Instructions (Signed)
Stop climbing on the step stool or ladder / avoid stairs when you can  Daily activity is ok but rest your leg when you can  When sitting-elevate your leg Also use ice and heat on and off for 10 minutes at a time (whichever works best)  Continue acetaminophen as needed /as directed  I sent in flexeril to use as needed since it has helped in the past  If no improvement next week we will have you in the office for an exam  In the meantime watch for skin change like rash or redness /bruising or warmth  Watch for swelling or a lump  Watch for back pain or numbness or tingling of legs  Call if symptoms worsen at any time   We will touch base on Tuesday as planned

## 2018-06-24 NOTE — Progress Notes (Signed)
Subjective:   [name] is a 78 y.o. female who presents for Medicare Annual (Subsequent) preventive examination.  Review of Systems:  N/A Cardiac Risk Factors include: advanced age (>26mn, >>6women);diabetes mellitus;dyslipidemia;hypertension     Objective:     Vitals: There were no vitals taken for this visit.  There is no height or weight on file to calculate BMI.  Advanced Directives 06/24/2018 06/21/2017 06/18/2016  Does Patient Have a Medical Advance Directive? No No Yes  Type of Advance Directive - -Public librarianLiving will  Copy of HCasmaliain Chart? - - No - copy requested  Would patient like information on creating a medical advance directive? No - Patient declined No - Patient declined -    Tobacco Social History   Tobacco Use  Smoking Status Never Smoker  Smokeless Tobacco Never Used     Counseling given: No   Clinical Intake:  Pre-visit preparation completed: Yes  Pain : No/denies pain Pain Score: 0-No pain     Nutritional Status: BMI of 19-24  Normal Nutritional Risks: None Diabetes: Yes CBG done?: No Did pt. bring in CBG monitor from home?: No  How often do you need to have someone help you when you read instructions, pamphlets, or other written materials from your doctor or pharmacy?: 1 - Never What is the last grade level you completed in school?: 12th grade  Interpreter Needed?: No  Comments: pt is a widow and lives alone Information entered by :: L Kelechi Astarita, LPN  Past Medical History:  Diagnosis Date  . 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 Fibroids   . Gallstones    incidental asymptomatic gallstones  . Heart murmur   . Hyperlipidemia   . Hypertension    History reviewed. No pertinent surgical history. Family History  Problem Relation Age of Onset  . Hypertension Mother   . Diabetes Mother   . Osteoporosis Sister   . Diabetes Brother    Social History   Socioeconomic History  .  Marital status: Married    Spouse name: Not on file  . Number of children: Not on file  . Years of education: Not on file  . Highest education level: Not on file  Occupational History  . Not on file  Social Needs  . Financial resource strain: Not on file  . Food insecurity:    Worry: Not on file    Inability: Not on file  . Transportation needs:    Medical: Not on file    Non-medical: Not on file  Tobacco Use  . Smoking status: Never Smoker  . Smokeless tobacco: Never Used  Substance and Sexual Activity  . Alcohol use: No    Alcohol/week: 0.0 standard drinks  . Drug use: No  . Sexual activity: Not Currently  Lifestyle  . Physical activity:    Days per week: Not on file    Minutes per session: Not on file  . Stress: Not on file  Relationships  . Social connections:    Talks on phone: Not on file    Gets together: Not on file    Attends religious service: Not on file    Active member of club or organization: Not on file    Attends meetings of clubs or organizations: Not on file    Relationship status: Not on file  Other Topics Concern  . Not on file  Social History Narrative  . Not on file    Outpatient  Encounter Medications as of 06/24/2018  Medication Sig  . ACCU-CHEK FASTCLIX LANCETS MISC Check glucose once daily (Dx. E11.9)  . aspirin 81 MG tablet Take 81 mg by mouth daily.  Marland Kitchen atorvastatin (LIPITOR) 10 MG tablet Take 1 tablet (10 mg total) by mouth daily. In evening  . Blood Glucose Monitoring Suppl (ACCU-CHEK AVIVA PLUS) W/DEVICE KIT Check glucose once daily and as needed for DM 250.0   . cholecalciferol (VITAMIN D) 1000 UNITS tablet Take 1,000 Units by mouth daily.  Marland Kitchen glucose blood (ACCU-CHEK AVIVA PLUS) test strip Use to check blood sugar once daliy (dx. E11.9)  . hydrochlorothiazide (HYDRODIURIL) 25 MG tablet Take 1 tablet (25 mg total) by mouth every morning.  Marland Kitchen lisinopril (PRINIVIL,ZESTRIL) 40 MG tablet Take 1 tablet (40 mg total) by mouth daily.  . Multiple  Vitamin (MULTIVITAMIN) tablet Take 1 tablet by mouth daily.    . potassium chloride SA (K-DUR,KLOR-CON) 20 MEQ tablet Take 1 tablet (20 mEq total) by mouth daily.  . hydrocortisone valerate ointment (WESTCORT) 0.2 % Apply 1 application topically 2 (two) times daily. (Patient not taking: Reported on 06/24/2018)  . [DISCONTINUED] cetirizine (ZYRTEC) 10 MG tablet Take 1 tablet (10 mg total) by mouth daily.  . [DISCONTINUED] cyclobenzaprine (FLEXERIL) 5 MG tablet Take 0.5-1 tablets (2.5-5 mg total) by mouth 3 (three) times daily as needed for muscle spasms. Do not mix with narcotics. May cause drowsiness.   No facility-administered encounter medications on file as of 06/24/2018.     Activities of Daily Living In your present state of health, do you have any difficulty performing the following activities: 06/24/2018  Hearing? N  Vision? N  Difficulty concentrating or making decisions? N  Walking or climbing stairs? N  Dressing or bathing? N  Doing errands, shopping? N  Preparing Food and eating ? N  Using the Toilet? N  In the past six months, have you accidently leaked urine? N  Do you have problems with loss of bowel control? N  Managing your Medications? N  Managing your Finances? N  Housekeeping or managing your Housekeeping? N  Some recent data might be hidden    Patient Care Team: Tower, Wynelle Fanny, MD as PCP - General    Assessment:   This is a routine wellness examination for Jaanvi.  Exercise Activities and Dietary recommendations Current Exercise Habits: Home exercise routine, Type of exercise: walking, Time (Minutes): 10, Frequency (Times/Week): 7, Weekly Exercise (Minutes/Week): 70, Intensity: Mild, Exercise limited by: None identified  Goals    . Increase physical activity     When schedule permits, I will resume exercise for 30 minutes 2 days per week.        Fall Risk Fall Risk  06/24/2018 06/21/2017 06/18/2016 04/05/2015 03/21/2014  Falls in the past year? 0 No No No No    Depression Screen PHQ 2/9 Scores 06/24/2018 06/21/2017 06/18/2016 04/05/2015  PHQ - 2 Score 0 0 0 0  PHQ- 9 Score 0 0 - -     Cognitive Function MMSE - Mini Mental State Exam 06/21/2017 06/18/2016  Orientation to time 5 5  Orientation to Place 5 5  Registration 3 3  Attention/ Calculation 0 0  Recall 3 3  Language- name 2 objects 0 0  Language- repeat 1 1  Language- follow 3 step command 3 3  Language- read & follow direction 0 0  Write a sentence 0 0  Copy design 0 0  Total score 20 20     PLEASE NOTE: A  Mini-Cog screen was completed. Maximum score is 17. A value of 0 denotes this part of [place]stein MMSE was not completed or the patient failed this part of the Mini-Cog screening.   Mini-Cog Screening Orientation to Time - Max 5 pts Orientation to Place - Max 5 pts Registration - Max 3 pts Recall - Max 3 pts Language Repeat - Max 1 pts      Immunization History  Administered Date(s) Administered  . Influenza Split 11/29/2010  . Influenza Whole 11/04/2011  . Influenza, High Dose Seasonal PF 11/19/2016, 12/06/2017  . Influenza,inj,quad, With Preservative 10/18/2015  . Influenza-Unspecified 11/12/2012, 11/16/2013, 11/21/2013, 11/15/2014, 11/01/2015  . Pneumococcal Conjugate-13 03/21/2014  . Pneumococcal Polysaccharide-23 08/14/2008  . Td 06/06/2003, 07/11/2013  . Tdap 07/11/2013  . Zoster 05/23/2014   Screening Tests Health Maintenance  Topic Date Due  . MAMMOGRAM  02/16/2019 (Originally 05/29/2018)  . PAP SMEAR-Modifier  03/20/2020 (Originally 02/25/2012)  . INFLUENZA VACCINE  09/17/2018  . FOOT EXAM  10/12/2018  . HEMOGLOBIN A1C  12/25/2018  . OPHTHALMOLOGY EXAM  01/08/2019  . TETANUS/TDAP  07/12/2023  . DEXA SCAN  Completed  . PNA vac Low Risk Adult  Completed      Plan:     I have personally reviewed, addressed, and noted the following in the patient's chart:  A. Medical and social history B. Use of alcohol, tobacco or illicit drugs  C. Current medications and  supplements D. Functional ability and status E.  Nutritional status F.  Physical activity G. Advance directives H. List of other physicians I.  Hospitalizations, surgeries, and ER visits in previous 12 months J.  Vitals (unless it is a telemedicine encounter) K. Screenings to include cognitive, depression, hearing, vision (NOTE: hearing and vision screenings not completed in telemedicine encounter) L. Referrals and appointments   In addition, I have reviewed and discussed with patient certain preventive protocols, quality metrics, and best practice recommendations. A written personalized care plan for preventive services and recommendations were provided to patient.  With patient's permission, we connected on 06/24/18 at 10:00 AM EDT by a video enabled telemedicine application. Two patient identifiers were used to ensure the encounter occurred with the correct person.    Patient was in home and writer was in office.   Signed,   Lindell Noe, MHA, BS, LPN Health Coach

## 2018-06-28 ENCOUNTER — Encounter: Payer: Self-pay | Admitting: Family Medicine

## 2018-06-28 ENCOUNTER — Ambulatory Visit (INDEPENDENT_AMBULATORY_CARE_PROVIDER_SITE_OTHER): Payer: Medicare Other | Admitting: Family Medicine

## 2018-06-28 ENCOUNTER — Other Ambulatory Visit: Payer: Self-pay | Admitting: Family Medicine

## 2018-06-28 DIAGNOSIS — E7800 Pure hypercholesterolemia, unspecified: Secondary | ICD-10-CM

## 2018-06-28 DIAGNOSIS — M79604 Pain in right leg: Secondary | ICD-10-CM | POA: Diagnosis not present

## 2018-06-28 DIAGNOSIS — R7989 Other specified abnormal findings of blood chemistry: Secondary | ICD-10-CM

## 2018-06-28 DIAGNOSIS — E119 Type 2 diabetes mellitus without complications: Secondary | ICD-10-CM | POA: Diagnosis not present

## 2018-06-28 DIAGNOSIS — I10 Essential (primary) hypertension: Secondary | ICD-10-CM

## 2018-06-28 MED ORDER — POTASSIUM CHLORIDE CRYS ER 20 MEQ PO TBCR: 20.0000 meq | EXTENDED_RELEASE_TABLET | Freq: Every day | ORAL | 3 refills | Status: DC

## 2018-06-28 MED ORDER — HYDROCHLOROTHIAZIDE 25 MG PO TABS: 25.0000 mg | ORAL_TABLET | Freq: Every morning | ORAL | 3 refills | Status: DC

## 2018-06-28 MED ORDER — ATORVASTATIN CALCIUM 10 MG PO TABS: 10.0000 mg | ORAL_TABLET | Freq: Every day | ORAL | 3 refills | Status: DC

## 2018-06-28 MED ORDER — LISINOPRIL 40 MG PO TABS: 40.0000 mg | ORAL_TABLET | Freq: Every day | ORAL | 3 refills | Status: DC

## 2018-06-28 MED ORDER — GLUCOSE BLOOD VI STRP: ORAL_STRIP | 3 refills | Status: DC

## 2018-06-28 NOTE — Assessment & Plan Note (Signed)
This gradually improves Pt is staying off step stool  Encourage elevation and ice  Update if not starting to improve in a week or if worsening

## 2018-06-28 NOTE — Patient Instructions (Signed)
Diet: Try to get most of your carbohydrates from produce (with the exception of white potatoes)  Eat less bread/pasta/rice/snack foods/cereals/sweets and other items from the middle of the grocery store (processed carbs) Avoid red meat/ fried foods/ egg yolks/ fatty breakfast meats/ butter, cheese and high fat dairy/ and shellfish    TSH is elevated-this means you may become hypothyroid in the future  I'm glad you do not have symptoms  We will call you to set up some more labs in 3 months to check this   Please follow up for a visit in 6 months

## 2018-06-28 NOTE — Assessment & Plan Note (Signed)
Lab Results  Component Value Date   TSH 5.02 (H) 06/24/2018    Will re check in 3 mo with thyroid profile No symptoms at all / disc what to watch for

## 2018-06-28 NOTE — Assessment & Plan Note (Signed)
Lab Results  Component Value Date   HGBA1C 6.8 (H) 06/24/2018   Disc DM diet goals as well as exercise Continue metformin  [facility]  F/u 6 mo

## 2018-06-28 NOTE — Assessment & Plan Note (Signed)
BP remains in good control  bp in fair control at this time  BP Readings from Last 1 Encounters:  10/11/17 136/78   No changes needed Most recent labs reviewed  Disc lifstyle change with low sodium diet and exercise

## 2018-06-28 NOTE — Assessment & Plan Note (Signed)
Disc goals for lipids and reasons to control them Rev last labs with pt Rev low sat fat diet in detail Well controlled with atorvastatin and diet

## 2018-06-28 NOTE — Progress Notes (Signed)
Virtual Visit via Video Note  I connected with [name] on 06/28/18 at  8:30 AM EDT by a video enabled telemedicine application and verified that I am speaking with the correct person using two identifiers.  Location: Patient: home Provider: office   Video contact failed-so rest of visit is conducted by phone    I discussed the limitations of evaluation and management by telemedicine and the availability of in person appointments. The patient expressed understanding and agreed to proceed.  History of Present Illness: Here for f/u of chronic health problems   Had amw on 5/8 No gaps or concerns   Mammogram 4/19 -has to re schedule due to pandemic  Self breast exam - no lumps   dexa 3/17 - BMD in the normal range   Wt Readings from Last 3 Encounters:  10/11/17 140 lb 12.8 oz (63.9 kg)  06/25/17 141 lb 4 oz (64.1 kg)  06/21/17 140 lb (63.5 kg)  does not feel like she has gained or lost   Leg is still sore but improving  Staying off ladders and step stools   HTN No cp or palpitations or headaches or edema  No side effects to medicines  BP Readings from Last 3 Encounters:  10/11/17 136/78  06/25/17 130/74  06/21/17 122/72   does not feel like it has been high or low     Lab Results  Component Value Date   CREATININE 1.00 06/24/2018   BUN 12 06/24/2018   NA 136 06/24/2018   K 3.7 06/24/2018   CL 97 06/24/2018   CO2 27 06/24/2018   Lab Results  Component Value Date   ALT 22 06/24/2018   AST 26 06/24/2018   ALKPHOS 73 06/24/2018   BILITOT 0.5 06/24/2018    Lab Results  Component Value Date   WBC 8.6 06/24/2018   HGB 13.8 06/24/2018   HCT 39.7 06/24/2018   MCV 81.4 06/24/2018   PLT 319.0 06/24/2018    Lab Results  Component Value Date   TSH 5.02 (H) 06/24/2018  this is new  No symptoms  Not fatigued /no dry skin (her skin is not more dry than it used to be)  fam hx of thyroid problems - sister  ? If over or under   DM2 Lab Results  Component  Value Date   HGBA1C 6.8 (H) 06/24/2018  this is up from 6.7 Fairly stable -no symptoms  On ace for renal protection  Quite active (before she hurt leg) - she likes to walk  Diet is pretty good  Sweets- cutting back /not often - picks fruit instead  More deep green vegetables  Protein- eggs / occ pork chop  Eye exam 11/19  Foot care -good /no problems   Hyperlipidemia Lab Results  Component Value Date   CHOL 172 06/24/2018   CHOL 148 10/04/2017   CHOL 174 04/02/2017   Lab Results  Component Value Date   HDL 73.40 06/24/2018   HDL 74.60 10/04/2017   HDL 75.70 04/02/2017   Lab Results  Component Value Date   LDLCALC 72 06/24/2018   [place] 58 10/04/2017   [place] 83 04/02/2017   Lab Results  Component Value Date   TRIG 132.0 06/24/2018   TRIG 76.0 10/04/2017   TRIG 75.0 04/02/2017   Lab Results  Component Value Date   CHOLHDL 2 06/24/2018   CHOLHDL 2 10/04/2017   CHOLHDL 2 04/02/2017   Lab Results  Component Value Date   LDLDIRECT 111.2 02/28/2013  LDLDIRECT 122.4 02/23/2012   LDLDIRECT 110.3 08/24/2011  occ fried chicken  No beef  Some pork chops  Baked chicken  Atorvastatin -tolerates well   Review of Systems  Constitutional: Negative for chills, fever, malaise/fatigue and weight loss.  HENT: Negative for sore throat.   Eyes: Negative for blurred vision.  Respiratory: Negative for cough and shortness of breath.   Cardiovascular: Negative for chest pain, palpitations, claudication and leg swelling.  Gastrointestinal: Negative for abdominal pain, blood in stool and heartburn.  Musculoskeletal: Negative for joint pain.       Soreness in R leg that is improving   Skin: Negative for rash.  Neurological: Negative for dizziness, focal weakness and headaches.  Endo/Heme/Allergies: Negative for polydipsia. Does not bruise/bleed easily.  Psychiatric/Behavioral: Negative for depression. The patient is not nervous/anxious.        Observations/Objective: Patient sounds well /like her usual self  Not distressed  No hoarseness or throat clearing  No cough or sob with speech  Mentally sharp /answers questions appropriately /nl cognition Nl mood /affect   Pleasant   Assessment and Plan: Problem List Items Addressed This Visit      Cardiovascular and Mediastinum   Essential hypertension    BP remains in good control  bp in fair control at this time  BP Readings from Last 1 Encounters:  10/11/17 136/78   No changes needed Most recent labs reviewed  Disc lifstyle change with low sodium diet and exercise        Relevant Medications   atorvastatin (LIPITOR) 10 MG tablet   hydrochlorothiazide (HYDRODIURIL) 25 MG tablet   lisinopril (ZESTRIL) 40 MG tablet     Endocrine   DM type 2 (diabetes mellitus, type 2) ([place]) - Primary    Lab Results  Component Value Date   HGBA1C 6.8 (H) 06/24/2018   Disc DM diet goals as well as exercise Continue metformin  utd eye and foot care  F/u 6 mo       Relevant Medications   atorvastatin (LIPITOR) 10 MG tablet   lisinopril (ZESTRIL) 40 MG tablet     Other   HYPERCHOLESTEROLEMIA    Disc goals for lipids and reasons to control them Rev last labs with pt Rev low sat fat diet in detail Well controlled with atorvastatin and diet       Relevant Medications   atorvastatin (LIPITOR) 10 MG tablet   hydrochlorothiazide (HYDRODIURIL) 25 MG tablet   lisinopril (ZESTRIL) 40 MG tablet   Right leg pain    This gradually improves Pt is staying off step stool  Encourage elevation and ice  Update if not starting to improve in a week or if worsening         Elevated TSH    Lab Results  Component Value Date   TSH 5.02 (H) 06/24/2018    Will re check in 3 mo with thyroid profile No symptoms at all / disc what to watch for       Relevant Orders   TSH   T4, free   T3, Free       Follow Up Instructions: Diet: Try to get most of your carbohydrates from produce  (with the exception of white potatoes)  Eat less bread/pasta/rice/snack foods/cereals/sweets and other items from the middle of the grocery store (processed carbs) Avoid red meat/ fried foods/ egg yolks/ fatty breakfast meats/ butter, cheese and high fat dairy/ and shellfish    TSH is elevated-this means you may become hypothyroid in the  future  I'm glad you do not have symptoms  We will call you to set up some more labs in 3 months to check this   Please follow up for a visit in 6 months    I discussed the assessment and treatment plan with the patient. The patient was provided an opportunity to ask questions and all were answered. The patient agreed with the plan and demonstrated an understanding of the instructions.   The patient was advised to call back or seek an in-person evaluation if the symptoms worsen or if the condition fails to improve as anticipated.     Loura Pardon, MD

## 2018-06-29 ENCOUNTER — Other Ambulatory Visit: Payer: Self-pay

## 2018-06-29 NOTE — Patient Outreach (Signed)
[facility]) Care Management  06/29/2018  Cierria Height [birth year] [id number]   Medication Adherence call to Mrs. [facility] Compliant Voice message left with a call back number. Mrs. Statzer is showing past due on Lisinopril 40 mg and Atorvastatin 10 mg under [place].   [facility] Direct Dial [phone]  Fax [phone] [email or web address]

## 2018-07-22 ENCOUNTER — Encounter: Payer: Self-pay | Admitting: Family Medicine

## 2018-07-22 DIAGNOSIS — Z1231 Encounter for screening mammogram for malignant neoplasm of breast: Secondary | ICD-10-CM | POA: Diagnosis not present

## 2018-09-22 ENCOUNTER — Telehealth: Payer: Self-pay

## 2018-09-22 NOTE — Telephone Encounter (Signed)
Left detailed VM w COVID screen and back door lab info   

## 2018-09-26 ENCOUNTER — Other Ambulatory Visit (INDEPENDENT_AMBULATORY_CARE_PROVIDER_SITE_OTHER): Payer: Medicare Other

## 2018-09-26 DIAGNOSIS — R7989 Other specified abnormal findings of blood chemistry: Secondary | ICD-10-CM

## 2018-09-26 DIAGNOSIS — R946 Abnormal results of thyroid function studies: Secondary | ICD-10-CM | POA: Diagnosis not present

## 2018-09-26 LAB — T4, FREE: Free T4: 0.88 ng/dL (ref 0.60–1.60)

## 2018-09-26 LAB — T3, FREE: T3, Free: 3 pg/mL (ref 2.3–4.2)

## 2018-09-26 LAB — TSH: TSH: 1.8 u[IU]/mL (ref 0.35–4.50)

## 2018-09-27 ENCOUNTER — Encounter: Payer: Self-pay | Admitting: *Deleted

## 2018-12-13 ENCOUNTER — Telehealth: Payer: Self-pay

## 2018-12-13 NOTE — Telephone Encounter (Signed)
LVM  w COVID screen, back lab and front door info

## 2018-12-18 ENCOUNTER — Telehealth: Payer: Self-pay | Admitting: Family Medicine

## 2018-12-18 DIAGNOSIS — E7800 Pure hypercholesterolemia, unspecified: Secondary | ICD-10-CM

## 2018-12-18 DIAGNOSIS — E119 Type 2 diabetes mellitus without complications: Secondary | ICD-10-CM

## 2018-12-18 DIAGNOSIS — I10 Essential (primary) hypertension: Secondary | ICD-10-CM

## 2018-12-18 NOTE — Telephone Encounter (Signed)
-----   Message from Ellamae Sia sent at 12/12/2018 11:46 AM EDT ----- Regarding: Lab orders for Monday, 11.2.20 Lab orders for f/u labs

## 2018-12-19 ENCOUNTER — Other Ambulatory Visit (INDEPENDENT_AMBULATORY_CARE_PROVIDER_SITE_OTHER): Payer: Medicare Other

## 2018-12-19 DIAGNOSIS — E7800 Pure hypercholesterolemia, unspecified: Secondary | ICD-10-CM

## 2018-12-19 DIAGNOSIS — I10 Essential (primary) hypertension: Secondary | ICD-10-CM | POA: Diagnosis not present

## 2018-12-19 DIAGNOSIS — E119 Type 2 diabetes mellitus without complications: Secondary | ICD-10-CM | POA: Diagnosis not present

## 2018-12-19 LAB — LIPID PANEL
Cholesterol: 171 mg/dL (ref 0–200)
HDL: 75.2 mg/dL (ref >39.00)
LDL Cholesterol: 81 mg/dL (ref 0–99)
NonHDL: 95.74
Total CHOL/HDL Ratio: 2
Triglycerides: 76 mg/dL (ref 0.0–149.0)
VLDL: 15.2 mg/dL (ref 0.0–40.0)

## 2018-12-19 LAB — COMPREHENSIVE METABOLIC PANEL
ALT: 15 U/L (ref 0–35)
AST: 21 U/L (ref 0–37)
Albumin: 4.3 g/dL (ref 3.5–5.2)
Alkaline Phosphatase: 76 U/L (ref 39–117)
BUN: 11 mg/dL (ref 6–23)
CO2: 31 mEq/L (ref 19–32)
Calcium: 9.9 mg/dL (ref 8.4–10.5)
Chloride: 99 mEq/L (ref 96–112)
Creatinine, Ser: 0.95 mg/dL (ref 0.40–1.20)
GFR: 69.08 mL/min (ref >60.00)
Glucose, Bld: 105 mg/dL — ABNORMAL HIGH (ref 70–99)
Potassium: 3.8 mEq/L (ref 3.5–5.1)
Sodium: 137 mEq/L (ref 135–145)
Total Bilirubin: 0.4 mg/dL (ref 0.2–1.2)
Total Protein: 7.4 g/dL (ref 6.0–8.3)

## 2018-12-19 LAB — HEMOGLOBIN A1C: Hgb A1c MFr Bld: 6.8 % — ABNORMAL HIGH (ref 4.6–6.5)

## 2018-12-27 ENCOUNTER — Other Ambulatory Visit: Payer: Self-pay

## 2018-12-27 ENCOUNTER — Ambulatory Visit (INDEPENDENT_AMBULATORY_CARE_PROVIDER_SITE_OTHER): Payer: Medicare Other | Admitting: Family Medicine

## 2018-12-27 ENCOUNTER — Encounter: Payer: Self-pay | Admitting: Family Medicine

## 2018-12-27 VITALS — BP 134/86 | HR 65 | Temp 97.4°F | Ht 60.0 in | Wt 146.4 lb

## 2018-12-27 DIAGNOSIS — R202 Paresthesia of skin: Secondary | ICD-10-CM | POA: Insufficient documentation

## 2018-12-27 DIAGNOSIS — I10 Essential (primary) hypertension: Secondary | ICD-10-CM

## 2018-12-27 DIAGNOSIS — E1169 Type 2 diabetes mellitus with other specified complication: Secondary | ICD-10-CM

## 2018-12-27 DIAGNOSIS — E785 Hyperlipidemia, unspecified: Secondary | ICD-10-CM | POA: Diagnosis not present

## 2018-12-27 DIAGNOSIS — E119 Type 2 diabetes mellitus without complications: Secondary | ICD-10-CM

## 2018-12-27 NOTE — Assessment & Plan Note (Signed)
bp in fair control at this time  BP Readings from Last 1 Encounters:  12/27/18 134/86   No changes needed Most recent labs reviewed  Disc lifstyle change with low sodium diet and exercise

## 2018-12-27 NOTE — Assessment & Plan Note (Addendum)
Suspect early carpal tunnel She has had cervical radiculopathy in the past but today mildly pos phalen test Disc changing positions for crochet  Also can try a wrist splint at night  Will update if no imp

## 2018-12-27 NOTE — Assessment & Plan Note (Signed)
Disc goals for lipids and reasons to control them Rev last labs with pt Rev low sat fat diet in detail  Controlled with atorvastatin and diet  LDL is 81-goal of under 70  Disc dietary changes

## 2018-12-27 NOTE — Progress Notes (Signed)
Subjective:    Patient ID: [name], female    DOB: 03/01/1940, 78 y.o.   MRN: [id number]  HPI Here for f/u of chronic medical problems  Feeling good overall   Wt Readings from Last 3 Encounters:  12/27/18 146 lb 6 oz (66.4 kg)  10/11/17 140 lb 12.8 oz (63.9 kg)  06/25/17 141 lb 4 oz (64.1 kg)  trying to take care of herself  28.59 kg/m   Daughter had emergency eye surgery- has had to take care of her   bp is stable today  No cp or palpitations or headaches or edema  No side effects to medicines  BP Readings from Last 3 Encounters:  12/27/18 134/86  10/11/17 136/78  06/25/17 130/74      DM2 Lab Results  Component Value Date   HGBA1C 6.8 (H) 12/19/2018  this is stable from may Has cheated a bit with diet at home- but overall stable Gets out in the yard when she can  No indoor exercise - some housework  Metformin -no problems  Eye care 11/19-has it scheduled 01/20/19 -got pushed back a bit  On statin and ace   Hyperlipidemia Lab Results  Component Value Date   CHOL 171 12/19/2018   CHOL 172 06/24/2018   CHOL 148 10/04/2017   Lab Results  Component Value Date   HDL 75.20 12/19/2018   HDL 73.40 06/24/2018   HDL 74.60 10/04/2017   Lab Results  Component Value Date   LDLCALC 81 12/19/2018   LDLCALC 72 06/24/2018   LDLCALC 58 10/04/2017   Lab Results  Component Value Date   TRIG 76.0 12/19/2018   TRIG 132.0 06/24/2018   TRIG 76.0 10/04/2017   Lab Results  Component Value Date   CHOLHDL 2 12/19/2018   CHOLHDL 2 06/24/2018   CHOLHDL 2 10/04/2017   Lab Results  Component Value Date   LDLDIRECT 111.2 02/28/2013   LDLDIRECT 122.4 02/23/2012   LDLDIRECT 110.3 08/24/2011   Taking atorvastatin and diet  Not eating red meat  Eats fast food occ- not much however  Fried food- eats fried chicken once in a while  Does not cook biscuits any more   Has noticed tingling in L hand occ (like asleep) No pain or weakness   Patient Active Problem List    Diagnosis Date Noted  . Left hand paresthesia 12/27/2018  . Elevated TSH 06/28/2018  . Right leg pain 09/13/2015  . Routine general medical examination at a health care facility 04/05/2015  . Estrogen deficiency 04/05/2015  . Hypokalemia 09/04/2013  . Nonspecific abnormal electrocardiogram (ECG) (EKG) 08/30/2013  . Encounter for Medicare annual wellness exam 03/07/2013  . Fullness of supraclavicular fossa 02/27/2011  . Other screening mammogram 02/25/2011  . Gynecological examination 02/25/2011  . CERVICAL RADICULOPATHY, LEFT 03/31/2010  . DM type 2 (diabetes mellitus, type 2) ([place]) 07/06/2007  . FIBROIDS, UTERUS 03/23/2007  . Hyperlipidemia associated with type 2 diabetes mellitus ([place]) 03/23/2007  . Essential hypertension 03/23/2007  . POSTMENOPAUSAL STATUS 03/23/2007  . MURMUR 03/23/2007   Past Medical History:  Diagnosis Date  . 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 Fibroids   . Gallstones    incidental asymptomatic gallstones  . Heart murmur   . Hyperlipidemia   . Hypertension    History reviewed. No pertinent surgical history. Social History   Tobacco Use  . Smoking status: Never Smoker  . Smokeless tobacco: Never Used  Substance Use Topics  . Alcohol use: No  Alcohol/week: 0.0 standard drinks  . Drug use: No   Family History  Problem Relation Age of Onset  . Hypertension Mother   . Diabetes Mother   . Osteoporosis Sister   . Diabetes Brother    Allergies  Allergen Reactions  . Ibuprofen     REACTION: palpitations  . Sulfamethoxazole-Trimethoprim     REACTION: rash   Current Outpatient Medications on File Prior to Visit  Medication Sig Dispense Refill  . ACCU-CHEK FASTCLIX LANCETS MISC Check glucose once daily (Dx. E11.9) 102 each 1  . aspirin 81 MG tablet Take 81 mg by mouth daily.    Marland Kitchen atorvastatin (LIPITOR) 10 MG tablet Take 1 tablet (10 mg total) by mouth daily. In evening 90 tablet 3  . Blood Glucose Monitoring Suppl (ACCU-CHEK AVIVA PLUS)  W/DEVICE KIT Check glucose once daily and as needed for DM 250.0     . cholecalciferol (VITAMIN D) 1000 UNITS tablet Take 1,000 Units by mouth daily.    Marland Kitchen glucose blood (ACCU-CHEK AVIVA PLUS) test strip Use to check blood sugar once daliy (dx. E11.9) 100 each 3  . hydrochlorothiazide (HYDRODIURIL) 25 MG tablet Take 1 tablet (25 mg total) by mouth every morning. 90 tablet 3  . lisinopril (ZESTRIL) 40 MG tablet Take 1 tablet (40 mg total) by mouth daily. 90 tablet 3  . Multiple Vitamin (MULTIVITAMIN) tablet Take 1 tablet by mouth daily.      . potassium chloride SA (K-DUR) 20 MEQ tablet Take 1 tablet (20 mEq total) by mouth daily. 90 tablet 3   No current facility-administered medications on file prior to visit.      Review of Systems  Constitutional: Negative for activity change, appetite change, fatigue, fever and unexpected weight change.  HENT: Negative for congestion, ear pain, rhinorrhea, sinus pressure and sore throat.   Eyes: Negative for pain, redness and visual disturbance.  Respiratory: Negative for cough, shortness of breath and wheezing.   Cardiovascular: Negative for chest pain and palpitations.  Gastrointestinal: Negative for abdominal pain, blood in stool, constipation and diarrhea.  Endocrine: Negative for polydipsia and polyuria.  Genitourinary: Negative for dysuria, frequency and urgency.  Musculoskeletal: Negative for arthralgias, back pain and myalgias.  Skin: Negative for pallor and rash.  Allergic/Immunologic: Negative for environmental allergies.  Neurological: Positive for numbness. Negative for dizziness, syncope and headaches.       Tingling L hand- it goes to sleep  Hematological: Negative for adenopathy. Does not bruise/bleed easily.  Psychiatric/Behavioral: Negative for decreased concentration and dysphoric mood. The patient is not nervous/anxious.        Objective:   Physical Exam Constitutional:      General: She is not in acute distress.    Appearance:  Normal appearance. She is well-developed and normal weight. She is not ill-appearing.  HENT:     Head: Normocephalic and atraumatic.  Eyes:     Conjunctiva/sclera: Conjunctivae normal.     Pupils: Pupils are equal, round, and reactive to light.  Neck:     Musculoskeletal: Normal range of motion and neck supple.     Thyroid: No thyromegaly.     Vascular: No carotid bruit or JVD.  Cardiovascular:     Rate and Rhythm: Normal rate and regular rhythm.     Heart sounds: Normal heart sounds. No gallop.   Pulmonary:     Effort: Pulmonary effort is normal. No respiratory distress.     Breath sounds: Normal breath sounds. No wheezing or rales.  Abdominal:  General: Bowel sounds are normal. There is no distension or abdominal bruit.     Palpations: Abdomen is soft. There is no mass.     Tenderness: There is no abdominal tenderness.  Lymphadenopathy:     Cervical: No cervical adenopathy.  Skin:    General: Skin is warm and dry.     Coloration: Skin is not pale.     Findings: No rash.  Neurological:     Mental Status: She is alert. Mental status is at baseline.     Sensory: No sensory deficit.     Deep Tendon Reflexes: Reflexes are normal and symmetric. Reflexes normal.     Comments: Mildly positive phalen test in L wrist/hand  Psychiatric:        Mood and Affect: Mood normal.           Assessment & Plan:   Problem List Items Addressed This Visit      Cardiovascular and Mediastinum   Essential hypertension    bp in fair control at this time  BP Readings from Last 1 Encounters:  12/27/18 134/86   No changes needed Most recent labs reviewed  Disc lifstyle change with low sodium diet and exercise          Endocrine   Hyperlipidemia associated with type 2 diabetes mellitus ([place])    Disc goals for lipids and reasons to control them Rev last labs with pt Rev low sat fat diet in detail  Controlled with atorvastatin and diet  LDL is 81-goal of under 70  Disc dietary  changes      DM type 2 (diabetes mellitus, type 2) ([place]) - Primary    Lab Results  Component Value Date   HGBA1C 6.8 (H) 12/19/2018   Well controlled with diet and metformin  Has opth appt 01/20/19 On statin and ace  Enc some indoor exercise for the winter         Other   Left hand paresthesia    Suspect early carpal tunnel She has had cervical radiculopathy in the past but today mildly pos phalen test Disc changing positions for crochet  Also can try a wrist splint at night  Will update if no imp

## 2018-12-27 NOTE — Assessment & Plan Note (Signed)
Lab Results  Component Value Date   HGBA1C 6.8 (H) 12/19/2018   Well controlled with diet and metformin  Has opth appt 01/20/19 On statin and ace  Enc some indoor exercise for the winter

## 2018-12-27 NOTE — Patient Instructions (Addendum)
When winter sets in - figure out a way to exercise in the house  Get outdoors when you can   For cholesterol  Avoid red meat/ fried foods/ egg yolks/ fatty breakfast meats/ butter, cheese and high fat dairy/ and shellfish    For your left wrist- look for a carpal tunnel wrist splint over the counter  It may help to wear it at night

## 2019-01-20 DIAGNOSIS — E119 Type 2 diabetes mellitus without complications: Secondary | ICD-10-CM | POA: Diagnosis not present

## 2019-01-20 LAB — HM DIABETES EYE EXAM

## 2019-01-23 ENCOUNTER — Telehealth: Payer: Self-pay | Admitting: Family Medicine

## 2019-01-23 MED ORDER — ACCU-CHEK AVIVA PLUS W/DEVICE KIT: PACK | 0 refills | Status: AC

## 2019-01-23 NOTE — Telephone Encounter (Signed)
Rx sent 

## 2019-01-23 NOTE — Telephone Encounter (Signed)
Patient stated that her meter is no longer working. She spoke with the pharmacy and she is needing a new prescription sent in so she she can pick up a new one .   Patient is using the [facility]

## 2019-01-26 ENCOUNTER — Encounter: Payer: Self-pay | Admitting: Family Medicine

## 2019-02-20 ENCOUNTER — Other Ambulatory Visit: Payer: Self-pay

## 2019-02-20 ENCOUNTER — Encounter (HOSPITAL_COMMUNITY): Payer: Self-pay | Admitting: Emergency Medicine

## 2019-02-20 ENCOUNTER — Inpatient Hospital Stay (HOSPITAL_COMMUNITY)
Admission: EM | Admit: 2019-02-20 | Discharge: 2019-02-25 | DRG: 418 | Disposition: A | Discharge status: Home / Self Care | Payer: Medicare Other | Attending: Internal Medicine | Admitting: Internal Medicine

## 2019-02-20 DIAGNOSIS — K859 Acute pancreatitis without necrosis or infection, unspecified: Secondary | ICD-10-CM | POA: Diagnosis not present

## 2019-02-20 DIAGNOSIS — R748 Abnormal levels of other serum enzymes: Secondary | ICD-10-CM | POA: Diagnosis not present

## 2019-02-20 DIAGNOSIS — Z8262 Family history of osteoporosis: Secondary | ICD-10-CM

## 2019-02-20 DIAGNOSIS — Z882 Allergy status to sulfonamides status: Secondary | ICD-10-CM | POA: Diagnosis not present

## 2019-02-20 DIAGNOSIS — Z8249 Family history of ischemic heart disease and other diseases of the circulatory system: Secondary | ICD-10-CM

## 2019-02-20 DIAGNOSIS — K219 Gastro-esophageal reflux disease without esophagitis: Secondary | ICD-10-CM | POA: Diagnosis not present

## 2019-02-20 DIAGNOSIS — R935 Abnormal findings on diagnostic imaging of other abdominal regions, including retroperitoneum: Secondary | ICD-10-CM | POA: Diagnosis not present

## 2019-02-20 DIAGNOSIS — Z20822 Contact with and (suspected) exposure to covid-19: Secondary | ICD-10-CM | POA: Diagnosis present

## 2019-02-20 DIAGNOSIS — E119 Type 2 diabetes mellitus without complications: Secondary | ICD-10-CM | POA: Diagnosis present

## 2019-02-20 DIAGNOSIS — K8020 Calculus of gallbladder without cholecystitis without obstruction: Secondary | ICD-10-CM | POA: Diagnosis not present

## 2019-02-20 DIAGNOSIS — K8500 Idiopathic acute pancreatitis without necrosis or infection: Secondary | ICD-10-CM | POA: Diagnosis not present

## 2019-02-20 DIAGNOSIS — E876 Hypokalemia: Secondary | ICD-10-CM | POA: Diagnosis not present

## 2019-02-20 DIAGNOSIS — N179 Acute kidney failure, unspecified: Secondary | ICD-10-CM | POA: Diagnosis present

## 2019-02-20 DIAGNOSIS — Z886 Allergy status to analgesic agent status: Secondary | ICD-10-CM | POA: Diagnosis not present

## 2019-02-20 DIAGNOSIS — K851 Biliary acute pancreatitis without necrosis or infection: Principal | ICD-10-CM | POA: Diagnosis present

## 2019-02-20 DIAGNOSIS — R1013 Epigastric pain: Secondary | ICD-10-CM | POA: Diagnosis present

## 2019-02-20 DIAGNOSIS — E860 Dehydration: Secondary | ICD-10-CM | POA: Diagnosis present

## 2019-02-20 DIAGNOSIS — R933 Abnormal findings on diagnostic imaging of other parts of digestive tract: Secondary | ICD-10-CM | POA: Diagnosis not present

## 2019-02-20 DIAGNOSIS — D259 Leiomyoma of uterus, unspecified: Secondary | ICD-10-CM | POA: Diagnosis present

## 2019-02-20 DIAGNOSIS — E785 Hyperlipidemia, unspecified: Secondary | ICD-10-CM | POA: Diagnosis not present

## 2019-02-20 DIAGNOSIS — Z7982 Long term (current) use of aspirin: Secondary | ICD-10-CM | POA: Diagnosis not present

## 2019-02-20 DIAGNOSIS — R1033 Periumbilical pain: Secondary | ICD-10-CM | POA: Diagnosis not present

## 2019-02-20 DIAGNOSIS — I10 Essential (primary) hypertension: Secondary | ICD-10-CM | POA: Diagnosis present

## 2019-02-20 DIAGNOSIS — Z8719 Personal history of other diseases of the digestive system: Secondary | ICD-10-CM | POA: Diagnosis present

## 2019-02-20 DIAGNOSIS — E1169 Type 2 diabetes mellitus with other specified complication: Secondary | ICD-10-CM | POA: Diagnosis not present

## 2019-02-20 DIAGNOSIS — K8010 Calculus of gallbladder with chronic cholecystitis without obstruction: Secondary | ICD-10-CM | POA: Diagnosis not present

## 2019-02-20 DIAGNOSIS — Z833 Family history of diabetes mellitus: Secondary | ICD-10-CM

## 2019-02-20 DIAGNOSIS — E869 Volume depletion, unspecified: Secondary | ICD-10-CM | POA: Diagnosis not present

## 2019-02-20 DIAGNOSIS — K660 Peritoneal adhesions (postprocedural) (postinfection): Secondary | ICD-10-CM | POA: Diagnosis present

## 2019-02-20 DIAGNOSIS — R1111 Vomiting without nausea: Secondary | ICD-10-CM | POA: Diagnosis not present

## 2019-02-20 LAB — CBC
HCT: 40.1 % (ref 36.0–46.0)
Hemoglobin: 13.9 g/dL (ref 12.0–15.0)
MCH: 28.5 pg (ref 26.0–34.0)
MCHC: 34.7 g/dL (ref 30.0–36.0)
MCV: 82.3 fL (ref 80.0–100.0)
Platelets: 333 10*3/uL (ref 150–400)
RBC: 4.87 MIL/uL (ref 3.87–5.11)
RDW: 13.6 % (ref 11.5–15.5)
WBC: 16.7 10*3/uL — ABNORMAL HIGH (ref 4.0–10.5)
nRBC: 0 % (ref 0.0–0.2)

## 2019-02-20 MED ORDER — ONDANSETRON 4 MG PO TBDP
4.0000 mg | ORAL_TABLET | Freq: Once | ORAL | Status: AC
  Administered 2019-02-20: 4 mg via ORAL
  Filled 2019-02-20: qty 1

## 2019-02-20 MED ORDER — SODIUM CHLORIDE 0.9% FLUSH: 3.0000 mL | Freq: Once | INTRAVENOUS | Status: DC

## 2019-02-20 NOTE — ED Triage Notes (Signed)
Pt c/o 8/10 epigastric pain since this morning with nausea and vomiting.

## 2019-02-20 NOTE — ED Notes (Signed)
Pt daughter Purvis Sheffield) requests an update on pt (her mother), and can be reached at [phone].

## 2019-02-21 ENCOUNTER — Inpatient Hospital Stay (HOSPITAL_COMMUNITY): Payer: Medicare Other

## 2019-02-21 ENCOUNTER — Emergency Department (HOSPITAL_COMMUNITY): Payer: Medicare Other

## 2019-02-21 DIAGNOSIS — R935 Abnormal findings on diagnostic imaging of other abdominal regions, including retroperitoneum: Secondary | ICD-10-CM | POA: Diagnosis not present

## 2019-02-21 DIAGNOSIS — R1013 Epigastric pain: Secondary | ICD-10-CM | POA: Diagnosis present

## 2019-02-21 DIAGNOSIS — R1033 Periumbilical pain: Secondary | ICD-10-CM | POA: Diagnosis not present

## 2019-02-21 DIAGNOSIS — Z20822 Contact with and (suspected) exposure to covid-19: Secondary | ICD-10-CM | POA: Diagnosis present

## 2019-02-21 DIAGNOSIS — E785 Hyperlipidemia, unspecified: Secondary | ICD-10-CM | POA: Diagnosis present

## 2019-02-21 DIAGNOSIS — E869 Volume depletion, unspecified: Secondary | ICD-10-CM | POA: Diagnosis present

## 2019-02-21 DIAGNOSIS — K219 Gastro-esophageal reflux disease without esophagitis: Secondary | ICD-10-CM | POA: Diagnosis present

## 2019-02-21 DIAGNOSIS — R933 Abnormal findings on diagnostic imaging of other parts of digestive tract: Secondary | ICD-10-CM | POA: Diagnosis not present

## 2019-02-21 DIAGNOSIS — K660 Peritoneal adhesions (postprocedural) (postinfection): Secondary | ICD-10-CM | POA: Diagnosis present

## 2019-02-21 DIAGNOSIS — K8020 Calculus of gallbladder without cholecystitis without obstruction: Secondary | ICD-10-CM | POA: Diagnosis not present

## 2019-02-21 DIAGNOSIS — Z8719 Personal history of other diseases of the digestive system: Secondary | ICD-10-CM | POA: Diagnosis present

## 2019-02-21 DIAGNOSIS — Z882 Allergy status to sulfonamides status: Secondary | ICD-10-CM | POA: Diagnosis not present

## 2019-02-21 DIAGNOSIS — Z7982 Long term (current) use of aspirin: Secondary | ICD-10-CM | POA: Diagnosis not present

## 2019-02-21 DIAGNOSIS — K851 Biliary acute pancreatitis without necrosis or infection: Secondary | ICD-10-CM | POA: Diagnosis present

## 2019-02-21 DIAGNOSIS — D259 Leiomyoma of uterus, unspecified: Secondary | ICD-10-CM | POA: Diagnosis present

## 2019-02-21 DIAGNOSIS — N179 Acute kidney failure, unspecified: Secondary | ICD-10-CM

## 2019-02-21 DIAGNOSIS — Z833 Family history of diabetes mellitus: Secondary | ICD-10-CM | POA: Diagnosis not present

## 2019-02-21 DIAGNOSIS — K859 Acute pancreatitis without necrosis or infection, unspecified: Secondary | ICD-10-CM

## 2019-02-21 DIAGNOSIS — Z886 Allergy status to analgesic agent status: Secondary | ICD-10-CM | POA: Diagnosis not present

## 2019-02-21 DIAGNOSIS — Z8249 Family history of ischemic heart disease and other diseases of the circulatory system: Secondary | ICD-10-CM | POA: Diagnosis not present

## 2019-02-21 DIAGNOSIS — I10 Essential (primary) hypertension: Secondary | ICD-10-CM | POA: Diagnosis present

## 2019-02-21 DIAGNOSIS — R748 Abnormal levels of other serum enzymes: Secondary | ICD-10-CM | POA: Diagnosis not present

## 2019-02-21 DIAGNOSIS — R1111 Vomiting without nausea: Secondary | ICD-10-CM | POA: Diagnosis not present

## 2019-02-21 DIAGNOSIS — K8010 Calculus of gallbladder with chronic cholecystitis without obstruction: Secondary | ICD-10-CM | POA: Diagnosis not present

## 2019-02-21 DIAGNOSIS — Z8262 Family history of osteoporosis: Secondary | ICD-10-CM | POA: Diagnosis not present

## 2019-02-21 DIAGNOSIS — E1169 Type 2 diabetes mellitus with other specified complication: Secondary | ICD-10-CM | POA: Diagnosis not present

## 2019-02-21 DIAGNOSIS — E119 Type 2 diabetes mellitus without complications: Secondary | ICD-10-CM | POA: Diagnosis present

## 2019-02-21 DIAGNOSIS — E860 Dehydration: Secondary | ICD-10-CM | POA: Diagnosis present

## 2019-02-21 DIAGNOSIS — E876 Hypokalemia: Secondary | ICD-10-CM | POA: Diagnosis not present

## 2019-02-21 LAB — URINALYSIS, ROUTINE W REFLEX MICROSCOPIC
Bilirubin Urine: NEGATIVE
Glucose, UA: NEGATIVE mg/dL
Hgb urine dipstick: NEGATIVE
Ketones, ur: NEGATIVE mg/dL
Leukocytes,Ua: NEGATIVE
Nitrite: NEGATIVE
Protein, ur: 30 mg/dL — AB
Specific Gravity, Urine: 1.011 (ref 1.005–1.030)
pH: 7 (ref 5.0–8.0)

## 2019-02-21 LAB — TROPONIN I (HIGH SENSITIVITY)
Troponin I (High Sensitivity): 6 ng/L (ref <18)
Troponin I (High Sensitivity): 70 ng/L — ABNORMAL HIGH (ref <18)

## 2019-02-21 LAB — COMPREHENSIVE METABOLIC PANEL
ALT: 31 U/L (ref 0–44)
AST: 67 U/L — ABNORMAL HIGH (ref 15–41)
Albumin: 3.3 g/dL — ABNORMAL LOW (ref 3.5–5.0)
Alkaline Phosphatase: 95 U/L (ref 38–126)
Anion gap: 12 (ref 5–15)
BUN: 45 mg/dL — ABNORMAL HIGH (ref 8–23)
CO2: 28 mmol/L (ref 22–32)
Calcium: 9.3 mg/dL (ref 8.9–10.3)
Chloride: 89 mmol/L — ABNORMAL LOW (ref 98–111)
Creatinine, Ser: 1.62 mg/dL — ABNORMAL HIGH (ref 0.44–1.00)
GFR calc Af Amer: 35 mL/min — ABNORMAL LOW (ref >60)
GFR calc non Af Amer: 31 mL/min — ABNORMAL LOW (ref >60)
Glucose, Bld: 90 mg/dL (ref 70–99)
Potassium: 4 mmol/L (ref 3.5–5.1)
Sodium: 129 mmol/L — ABNORMAL LOW (ref 135–145)
Total Bilirubin: 3.7 mg/dL — ABNORMAL HIGH (ref 0.3–1.2)
Total Protein: 7.9 g/dL (ref 6.5–8.1)

## 2019-02-21 LAB — CBG MONITORING, ED: Glucose-Capillary: 174 mg/dL — ABNORMAL HIGH (ref 70–99)

## 2019-02-21 LAB — POC SARS CORONAVIRUS 2 AG -  ED: SARS Coronavirus 2 Ag: NEGATIVE

## 2019-02-21 LAB — LIPASE, BLOOD: Lipase: 27 U/L (ref 11–51)

## 2019-02-21 MED ORDER — FENTANYL CITRATE (PF) 100 MCG/2ML IJ SOLN
25.0000 ug | Freq: Once | INTRAMUSCULAR | Status: AC
  Administered 2019-02-21: 12:00:00 25 ug via INTRAVENOUS
  Filled 2019-02-21: qty 2

## 2019-02-21 MED ORDER — SODIUM CHLORIDE 0.9 % IV BOLUS
1000.0000 mL | Freq: Once | INTRAVENOUS | Status: AC
  Administered 2019-02-21: 12:00:00 1000 mL via INTRAVENOUS

## 2019-02-21 MED ORDER — BACID PO TABS
2.0000 | ORAL_TABLET | Freq: Three times a day (TID) | ORAL | Status: DC
  Administered 2019-02-21 – 2019-02-25 (×9): 2 via ORAL
  Filled 2019-02-21 (×13): qty 2

## 2019-02-21 MED ORDER — METRONIDAZOLE 500 MG PO TABS
500.0000 mg | ORAL_TABLET | Freq: Three times a day (TID) | ORAL | Status: DC
  Administered 2019-02-21 – 2019-02-24 (×8): 500 mg via ORAL
  Filled 2019-02-21 (×8): qty 1

## 2019-02-21 MED ORDER — GADOBUTROL 1 MMOL/ML IV SOLN
6.5000 mL | Freq: Once | INTRAVENOUS | Status: AC | PRN
  Administered 2019-02-21: 21:00:00 6.5 mL via INTRAVENOUS

## 2019-02-21 MED ORDER — ACETAMINOPHEN 325 MG PO TABS
650.0000 mg | ORAL_TABLET | Freq: Four times a day (QID) | ORAL | Status: DC | PRN
  Administered 2019-02-22 – 2019-02-25 (×4): 650 mg via ORAL
  Filled 2019-02-21 (×4): qty 2

## 2019-02-21 MED ORDER — VITAMIN D 25 MCG (1000 UNIT) PO TABS
1000.0000 [IU] | ORAL_TABLET | Freq: Every day | ORAL | Status: DC
  Administered 2019-02-22 – 2019-02-25 (×3): 1000 [IU] via ORAL
  Filled 2019-02-21 (×3): qty 1

## 2019-02-21 MED ORDER — ONDANSETRON HCL 4 MG/2ML IJ SOLN
4.0000 mg | Freq: Once | INTRAMUSCULAR | Status: AC
  Administered 2019-02-21: 16:00:00 4 mg via INTRAVENOUS
  Filled 2019-02-21: qty 2

## 2019-02-21 MED ORDER — ONDANSETRON HCL 4 MG PO TABS: 4.0000 mg | ORAL_TABLET | Freq: Four times a day (QID) | ORAL | Status: DC | PRN

## 2019-02-21 MED ORDER — HYDRALAZINE HCL 25 MG PO TABS: 25.0000 mg | ORAL_TABLET | Freq: Four times a day (QID) | ORAL | Status: DC | PRN

## 2019-02-21 MED ORDER — SODIUM CHLORIDE 0.9 % IV SOLN: INTRAVENOUS | Status: AC

## 2019-02-21 MED ORDER — SODIUM CHLORIDE 0.9 % IV SOLN
1.0000 g | INTRAVENOUS | Status: DC
  Administered 2019-02-21 – 2019-02-23 (×3): 1 g via INTRAVENOUS
  Filled 2019-02-21: qty 10
  Filled 2019-02-21: qty 1
  Filled 2019-02-21: qty 10
  Filled 2019-02-21: qty 1

## 2019-02-21 MED ORDER — INSULIN ASPART 100 UNIT/ML ~~LOC~~ SOLN
0.0000 [IU] | Freq: Three times a day (TID) | SUBCUTANEOUS | Status: DC
  Administered 2019-02-22: 17:00:00 1 [IU] via SUBCUTANEOUS
  Administered 2019-02-22: 2 [IU] via SUBCUTANEOUS
  Administered 2019-02-23 (×2): 1 [IU] via SUBCUTANEOUS

## 2019-02-21 MED ORDER — HEPARIN SODIUM (PORCINE) 5000 UNIT/ML IJ SOLN
5000.0000 [IU] | Freq: Three times a day (TID) | INTRAMUSCULAR | Status: DC
  Administered 2019-02-21 – 2019-02-25 (×8): 5000 [IU] via SUBCUTANEOUS
  Filled 2019-02-21 (×9): qty 1

## 2019-02-21 MED ORDER — ASPIRIN EC 81 MG PO TBEC
81.0000 mg | DELAYED_RELEASE_TABLET | Freq: Every day | ORAL | Status: DC
  Administered 2019-02-21 – 2019-02-25 (×4): 81 mg via ORAL
  Filled 2019-02-21 (×4): qty 1

## 2019-02-21 MED ORDER — ONDANSETRON HCL 4 MG/2ML IJ SOLN: 4.0000 mg | Freq: Four times a day (QID) | INTRAMUSCULAR | Status: DC | PRN

## 2019-02-21 MED ORDER — ACETAMINOPHEN 650 MG RE SUPP: 650.0000 mg | Freq: Four times a day (QID) | RECTAL | Status: DC | PRN

## 2019-02-21 MED ORDER — HYDROMORPHONE HCL 1 MG/ML IJ SOLN: 0.5000 mg | INTRAMUSCULAR | Status: DC | PRN

## 2019-02-21 MED ORDER — ATORVASTATIN CALCIUM 10 MG PO TABS
10.0000 mg | ORAL_TABLET | Freq: Every day | ORAL | Status: DC
  Administered 2019-02-21: 10 mg via ORAL
  Filled 2019-02-21: qty 1

## 2019-02-21 MED ORDER — MORPHINE SULFATE (PF) 4 MG/ML IV SOLN
4.0000 mg | Freq: Once | INTRAVENOUS | Status: AC
  Administered 2019-02-21: 4 mg via INTRAVENOUS
  Filled 2019-02-21: qty 1

## 2019-02-21 MED ORDER — OXYCODONE HCL 5 MG PO TABS: 5.0000 mg | ORAL_TABLET | ORAL | Status: DC | PRN

## 2019-02-21 MED ORDER — ADULT MULTIVITAMIN W/MINERALS CH
1.0000 | ORAL_TABLET | Freq: Every day | ORAL | Status: DC
  Administered 2019-02-22 – 2019-02-25 (×3): 1 via ORAL
  Filled 2019-02-21 (×3): qty 1

## 2019-02-21 MED ORDER — SODIUM CHLORIDE 0.9 % IV BOLUS
1000.0000 mL | Freq: Once | INTRAVENOUS | Status: AC
  Administered 2019-02-21: 1000 mL via INTRAVENOUS

## 2019-02-21 NOTE — ED Notes (Signed)
Sophia (daughter) would like to be called with updates and discharge if possible. She has to go to work.  [phone]

## 2019-02-21 NOTE — ED Notes (Signed)
Pt returned from [facility]

## 2019-02-21 NOTE — ED Notes (Signed)
Patient transported to CT 

## 2019-02-21 NOTE — ED Notes (Signed)
Pt ambulated to bathroom and then will be transported to MRI

## 2019-02-21 NOTE — ED Provider Notes (Signed)
[facility] EMERGENCY DEPARTMENT Provider Note   CSN: [id number] Arrival date & time: 02/20/19  2144     History Chief Complaint  Patient presents with  . Abdominal Pain  . Emesis    [name] is a 79 y.o. female.  HPI    Patient presents with abdominal pain, nausea, vomiting. She notes that her pain is somewhat better now than it was earlier, but starting yesterday she has had pain in the epigastric area. Pain is nonradiating, sore, severe, sharp. There is associated anorexia, nausea, and she has had multiple episodes of vomiting. No diarrhea.  She notes that last week she did have a change in her bowel movements, nonspecific, but different from normal. No other abdominal pain, no other chest pain, no fever, no dyspnea. She has no history of abdominal surgery. Past Medical History:  Diagnosis Date  . 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 Fibroids   . Gallstones    incidental asymptomatic gallstones  . Heart murmur   . Hyperlipidemia   . Hypertension     Patient Active Problem List   Diagnosis Date Noted  . Left hand paresthesia 12/27/2018  . Elevated TSH 06/28/2018  . Right leg pain 09/13/2015  . Routine general medical examination at a health care facility 04/05/2015  . Estrogen deficiency 04/05/2015  . Hypokalemia 09/04/2013  . Nonspecific abnormal electrocardiogram (ECG) (EKG) 08/30/2013  . Encounter for Medicare annual wellness exam 03/07/2013  . Fullness of supraclavicular fossa 02/27/2011  . Other screening mammogram 02/25/2011  . Gynecological examination 02/25/2011  . CERVICAL RADICULOPATHY, LEFT 03/31/2010  . DM type 2 (diabetes mellitus, type 2) ([place]) 07/06/2007  . FIBROIDS, UTERUS 03/23/2007  . Hyperlipidemia associated with type 2 diabetes mellitus ([place]) 03/23/2007  . Essential hypertension 03/23/2007  . POSTMENOPAUSAL STATUS 03/23/2007  . MURMUR 03/23/2007    History reviewed. No pertinent surgical history.   OB History    No obstetric history on file.     Family History  Problem Relation Age of Onset  . Hypertension Mother   . Diabetes Mother   . Osteoporosis Sister   . Diabetes Brother     Social History   Tobacco Use  . Smoking status: Never Smoker  . Smokeless tobacco: Never Used  Substance Use Topics  . Alcohol use: No    Alcohol/week: 0.0 standard drinks  . Drug use: No    Home Medications Prior to Admission medications   Medication Sig Start Date End Date Taking? Authorizing Provider  lisinopril (ZESTRIL) 40 MG tablet Take 1 tablet (40 mg total) by mouth daily. 06/28/18  Yes Tower, Wynelle Fanny, MD  ACCU-CHEK FASTCLIX LANCETS MISC Check glucose once daily (Dx. E11.9) 06/21/17   Tower, Wynelle Fanny, MD  aspirin 81 MG tablet Take 81 mg by mouth daily.    [provider]  atorvastatin (LIPITOR) 10 MG tablet Take 1 tablet (10 mg total) by mouth daily. In evening 06/28/18   Tower, Wynelle Fanny, MD  Blood Glucose Monitoring Suppl (ACCU-CHEK AVIVA PLUS) w/Device KIT Use to check glucose once daily for DM 2 (Dx. E11.9) 01/23/19   Tower, Wynelle Fanny, MD  cholecalciferol (VITAMIN D) 1000 UNITS tablet Take 1,000 Units by mouth daily.    [provider]  glucose blood (ACCU-CHEK AVIVA PLUS) test strip Use to check blood sugar once daliy (dx. E11.9) 06/28/18   Tower, Wynelle Fanny, MD  hydrochlorothiazide (HYDRODIURIL) 25 MG tablet Take 1 tablet (25 mg total) by mouth every morning. 06/28/18  Tower, Wynelle Fanny, MD  Multiple Vitamin (MULTIVITAMIN) tablet Take 1 tablet by mouth daily.      [provider]  potassium chloride SA (K-DUR) 20 MEQ tablet Take 1 tablet (20 mEq total) by mouth daily. 06/28/18   Tower, Wynelle Fanny, MD    Allergies    Ibuprofen and Sulfamethoxazole-trimethoprim  Review of Systems   Review of Systems  Constitutional:       Per HPI, otherwise negative  HENT:       Per HPI, otherwise negative  Respiratory:       Per HPI, otherwise negative  Cardiovascular:       Per HPI, otherwise  negative  Gastrointestinal: Positive for abdominal pain, nausea and vomiting.  Endocrine:       Negative aside from HPI  Genitourinary:       Neg aside from HPI   Musculoskeletal:       Per HPI, otherwise negative  Skin: Negative.   Neurological: Negative for syncope.    Physical Exam Updated Vital Signs BP 136/68   Pulse 70   Temp 98 F (36.7 C) (Oral)   Resp 16   Ht 5' (1.524 m)   Wt 65.8 kg   SpO2 99%   BMI 28.32 kg/m   Physical Exam Vitals and nursing note reviewed.  Constitutional:      General: She is not in acute distress.    Appearance: She is well-developed.  HENT:     Head: Normocephalic and atraumatic.  Eyes:     Conjunctiva/sclera: Conjunctivae normal.  Cardiovascular:     Rate and Rhythm: Normal rate and regular rhythm.  Pulmonary:     Effort: Pulmonary effort is normal. No respiratory distress.     Breath sounds: Normal breath sounds. No stridor.  Abdominal:     General: There is no distension.     Tenderness: There is abdominal tenderness in the epigastric area.  Skin:    General: Skin is warm and dry.  Neurological:     Mental Status: She is alert and oriented to person, place, and time.     Cranial Nerves: No cranial nerve deficit.     ED Results / Procedures / Treatments   Labs (all labs ordered are listed, but only abnormal results are displayed) Labs Reviewed  COMPREHENSIVE METABOLIC PANEL - Abnormal; Notable for the following components:      Result Value   Sodium 129 (*)    Chloride 89 (*)    BUN 45 (*)    Creatinine, Ser 1.62 (*)    Albumin 3.3 (*)    AST 67 (*)    Total Bilirubin 3.7 (*)    GFR calc non Af Amer 31 (*)    GFR calc Af Amer 35 (*)    All other components within normal limits  CBC - Abnormal; Notable for the following components:   WBC 16.7 (*)    All other components within normal limits  URINALYSIS, ROUTINE W REFLEX MICROSCOPIC - Abnormal; Notable for the following components:   Color, Urine AMBER (*)     APPearance HAZY (*)    Protein, ur 30 (*)    Bacteria, UA RARE (*)    All other components within normal limits  TROPONIN I (HIGH SENSITIVITY) - Abnormal; Notable for the following components:   Troponin I (High Sensitivity) 70 (*)    All other components within normal limits  LIPASE, BLOOD  POC SARS CORONAVIRUS 2 AG -  ED  TROPONIN I (HIGH SENSITIVITY)  EKG EKG Interpretation  Date/Time:  Monday February 20 2019 22:27:04 EST Ventricular Rate:  75 PR Interval:  156 QRS Duration: 88 QT Interval:  490 QTC Calculation: 547 R Axis:   -24 Text Interpretation: Normal sinus rhythm Right atrial enlargement Moderate voltage criteria for LVH, may be normal variant ( R in aVL , Cornell product ) Nonspecific ST abnormality Prolonged QT Abnormal ECG When compared with ECG of 08/29/2013, QT has lengthened Nonspecific T wave abnormality has improved Confirmed by Delora Fuel ([id number]) on 02/21/2019 1:37:06 AM   Radiology CT Abdomen Pelvis Wo Contrast  Result Date: 02/21/2019 CLINICAL DATA:  Nausea and vomiting. EXAM: CT ABDOMEN AND PELVIS WITHOUT CONTRAST TECHNIQUE: Multidetector CT imaging of the abdomen and pelvis was performed following the standard protocol without IV contrast. COMPARISON:  None. FINDINGS: Lower chest: No acute abnormality. Hepatobiliary: No focal liver abnormality is seen. Numerous subcentimeter gallstones are seen within the gallbladder without evidence of gallbladder wall thickening or biliary dilatation. Pancreas: Mild peripancreatic inflammatory fat stranding is seen within the region of the tail of the pancreas. Spleen: Normal in size without focal abnormality. Adrenals/Urinary Tract: Adrenal glands are unremarkable. Kidneys are normal, without renal calculi, focal lesion, or hydronephrosis. Bladder is unremarkable. Stomach/Bowel: There is a small hiatal hernia. Appendix appears normal. No evidence of bowel wall thickening, distention, or inflammatory changes. Vascular/Lymphatic:  There is mild aortic atherosclerosis. No enlarged abdominal or pelvic lymph nodes. Reproductive: A 3.4 cm x 2.2 cm calcified uterine fibroid is seen along the anterolateral aspect of the uterus on the left. The bilateral adnexa are unremarkable. Other: Musculoskeletal: There is marked severity dextroscoliosis of the lumbar spine with marked severity multilevel degenerative changes. IMPRESSION: 1. Findings consistent with acute pancreatitis. Correlation with pancreatic enzymes is recommended. 2. Cholelithiasis. 3. Small hiatal hernia. 4. Calcified uterine fibroid. Electronically Signed   By: Virgina Norfolk M.D.   On: 02/21/2019 15:40    Procedures Procedures (including critical care time)  Medications Ordered in ED Medications  sodium chloride flush (NS) 0.9 % injection 3 mL (has no administration in time range)  sodium chloride 0.9 % bolus 1,000 mL (has no administration in time range)  morphine 4 MG/ML injection 4 mg (has no administration in time range)  ondansetron (ZOFRAN-ODT) disintegrating tablet 4 mg (4 mg Oral Given 02/20/19 2310)  sodium chloride 0.9 % bolus 1,000 mL (1,000 mLs Intravenous New Bag/Given 02/21/19 1209)  fentaNYL (SUBLIMAZE) injection 25 mcg (25 mcg Intravenous Given 02/21/19 1209)    ED Course  I have reviewed the triage vital signs and the nursing notes.  Pertinent labs & imaging results that were available during my care of the patient were reviewed by me and considered in my medical decision making (see chart for details).    MDM Rules/Calculators/A&P                      4:06 PM I reviewed the findings from the CT, and the images myself. On repeat exam the patient continues to complain of pain.  This elderly female presents with epigastric pain, nausea, vomiting. Patient has no other chest pain, no other abdominal pain, but with her age, risk factors, broad differential considered. Patient did have slight elevation initial troponin, but after fluid  resuscitation, analgesia, delta troponin was negative, and she continued to deny other chest pain, atypical ACS less likely, some suspicion for demand ischemia given her pain, and evidence for dehydration/acute kidney injury. Patient's findings also notable for acute pancreatitis requiring admission,  fluids, analgesia. Final Clinical Impression(s) / ED Diagnoses Final diagnoses:  Idiopathic acute pancreatitis without infection or necrosis  AKI (acute kidney injury) ([place])     Carmin Muskrat, MD 02/21/19 [phone]

## 2019-02-21 NOTE — ED Notes (Signed)
Pt is NSR on monitor 

## 2019-02-21 NOTE — H&P (Signed)
History and Physical    [name] DJM:[id number] DOB: Jul 08, 1940 DOA: 02/20/2019  PCP: Abner Greenspan, MD   Patient coming from: Home  I have personally briefly reviewed patient's old medical records in [place]  Chief Complaint: Abd pain  HPI: [name] is a 79 y.o. female with medical history significant of diet-controlled diabetes, hypertension hyperlipidemia presents with abdominal pain, nausea, vomiting.  Symptoms started 2 days ago epigastric area located, initially she thought it is her GERD comes back, then the pains nature turn into cramping like and become more constant.  She tried Tylenol did not help. Tain is nonradiating, sore, severe, cramping. There is associated anorexia, nausea, and she has had multiple episodes of vomiting. No diarrhea.  No other abdominal pain, no other chest pain, no fever, no dyspnea. ED Course: CT showed acute pancreatitis and gallstone, with normal lipase, WBC count elevated.  Review of Systems: As per HPI otherwise 10 point review of systems negative.    Past Medical History:  Diagnosis Date  . 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 Fibroids   . Gallstones    incidental asymptomatic gallstones  . Heart murmur   . Hyperlipidemia   . Hypertension     History reviewed. No pertinent surgical history.   reports that she has never smoked. She has never used smokeless tobacco. She reports that she does not drink alcohol or use drugs.  Allergies  Allergen Reactions  . Ibuprofen     REACTION: palpitations  . Sulfamethoxazole-Trimethoprim     REACTION: rash    Family History  Problem Relation Age of Onset  . Hypertension Mother   . Diabetes Mother   . Osteoporosis Sister   . Diabetes Brother      Prior to Admission medications   Medication Sig Start Date End Date Taking? Authorizing Provider  lisinopril (ZESTRIL) 40 MG tablet Take 1 tablet (40 mg total) by mouth daily. 06/28/18  Yes Tower, Wynelle Fanny, MD  ACCU-CHEK  FASTCLIX LANCETS MISC Check glucose once daily (Dx. E11.9) 06/21/17   Tower, Wynelle Fanny, MD  aspirin 81 MG tablet Take 81 mg by mouth daily.    [provider]  atorvastatin (LIPITOR) 10 MG tablet Take 1 tablet (10 mg total) by mouth daily. In evening 06/28/18   Tower, Wynelle Fanny, MD  Blood Glucose Monitoring Suppl (ACCU-CHEK AVIVA PLUS) w/Device KIT Use to check glucose once daily for DM 2 (Dx. E11.9) 01/23/19   Tower, Wynelle Fanny, MD  cholecalciferol (VITAMIN D) 1000 UNITS tablet Take 1,000 Units by mouth daily.    [provider]  glucose blood (ACCU-CHEK AVIVA PLUS) test strip Use to check blood sugar once daliy (dx. E11.9) 06/28/18   Tower, Wynelle Fanny, MD  hydrochlorothiazide (HYDRODIURIL) 25 MG tablet Take 1 tablet (25 mg total) by mouth every morning. 06/28/18   Tower, Wynelle Fanny, MD  Multiple Vitamin (MULTIVITAMIN) tablet Take 1 tablet by mouth daily.      [provider]  potassium chloride SA (K-DUR) 20 MEQ tablet Take 1 tablet (20 mEq total) by mouth daily. 06/28/18   Abner Greenspan, MD    Physical Exam: Vitals:   02/21/19 1808 02/21/19 1810 02/21/19 1900 02/21/19 1915  BP: 129/64  104/70   Pulse:  73 84 84  Resp:  '14 15 14  '$ Temp:      TempSrc:      SpO2:  95% 96% 96%  Weight:      Height:  Constitutional: NAD, calm, comfortable Vitals:   02/21/19 1808 02/21/19 1810 02/21/19 1900 02/21/19 1915  BP: 129/64  104/70   Pulse:  73 84 84  Resp:  '14 15 14  '$ Temp:      TempSrc:      SpO2:  95% 96% 96%  Weight:      Height:       Eyes: PERRL, lids and conjunctivae normal ENMT: Mucous membranes are moist. Posterior pharynx clear of any exudate or lesions.Normal dentition.  Neck: normal, supple, no masses, no thyromegaly Respiratory: clear to auscultation bilaterally, no wheezing, no crackles. Normal respiratory effort. No accessory muscle use.  Cardiovascular: Regular rate and rhythm, no murmurs / rubs / gallops. No extremity edema. 2+ pedal pulses. No carotid  bruits.  Abdomen: no tenderness, no masses palpated. No hepatosplenomegaly. Bowel sounds positive.  Musculoskeletal: no clubbing / cyanosis. No joint deformity upper and lower extremities. Good ROM, no contractures. Normal muscle tone.  Skin: no rashes, lesions, ulcers. No induration Neurologic: CN 2-12 grossly intact. Sensation intact, DTR normal. Strength 5/5 in all 4.  Psychiatric: Normal judgment and insight. Alert and oriented x 3. Normal mood.     Labs on Admission: I have personally reviewed following labs and imaging studies  CBC: Recent Labs  Lab 02/20/19 2243  WBC 16.7*  HGB 13.9  HCT 40.1  MCV 82.3  PLT 269   Basic Metabolic Panel: Recent Labs  Lab 02/20/19 2243  NA 129*  K 4.0  CL 89*  CO2 28  GLUCOSE 90  BUN 45*  CREATININE 1.62*  CALCIUM 9.3   GFR: Estimated Creatinine Clearance: 25 mL/min (A) (by C-G formula based on SCr of 1.62 mg/dL (H)). Liver Function Tests: Recent Labs  Lab 02/20/19 2243  AST 67*  ALT 31  ALKPHOS 95  BILITOT 3.7*  PROT 7.9  ALBUMIN 3.3*   Recent Labs  Lab 02/20/19 2243  LIPASE 27   No results for input(s): AMMONIA in the last 168 hours. Coagulation Profile: No results for input(s): INR, PROTIME in the last 168 hours. Cardiac Enzymes: No results for input(s): CKTOTAL, CKMB, CKMBINDEX, TROPONINI in the last 168 hours. BNP (last 3 results) No results for input(s): PROBNP in the last 8760 hours. HbA1C: No results for input(s): HGBA1C in the last 72 hours. CBG: No results for input(s): GLUCAP in the last 168 hours. Lipid Profile: No results for input(s): CHOL, HDL, LDLCALC, TRIG, CHOLHDL, LDLDIRECT in the last 72 hours. Thyroid Function Tests: No results for input(s): TSH, T4TOTAL, FREET4, T3FREE, THYROIDAB in the last 72 hours. Anemia Panel: No results for input(s): VITAMINB12, FOLATE, FERRITIN, TIBC, IRON, RETICCTPCT in the last 72 hours. Urine analysis:    Component Value Date/Time   COLORURINE AMBER (A)  02/21/2019 0039   APPEARANCEUR HAZY (A) 02/21/2019 0039   LABSPEC 1.011 02/21/2019 0039   PHURINE 7.0 02/21/2019 0039   GLUCOSEU NEGATIVE 02/21/2019 0039   HGBUR NEGATIVE 02/21/2019 0039   BILIRUBINUR NEGATIVE 02/21/2019 0039   KETONESUR NEGATIVE 02/21/2019 0039   PROTEINUR 30 (A) 02/21/2019 0039   UROBILINOGEN 0.2 09/06/2008 0523   NITRITE NEGATIVE 02/21/2019 0039   LEUKOCYTESUR NEGATIVE 02/21/2019 0039    Radiological Exams on Admission: CT Abdomen Pelvis Wo Contrast  Result Date: 02/21/2019 CLINICAL DATA:  Nausea and vomiting. EXAM: CT ABDOMEN AND PELVIS WITHOUT CONTRAST TECHNIQUE: Multidetector CT imaging of the abdomen and pelvis was performed following the standard protocol without IV contrast. COMPARISON:  None. FINDINGS: Lower chest: No acute abnormality. Hepatobiliary: No focal liver  abnormality is seen. Numerous subcentimeter gallstones are seen within the gallbladder without evidence of gallbladder wall thickening or biliary dilatation. Pancreas: Mild peripancreatic inflammatory fat stranding is seen within the region of the tail of the pancreas. Spleen: Normal in size without focal abnormality. Adrenals/Urinary Tract: Adrenal glands are unremarkable. Kidneys are normal, without renal calculi, focal lesion, or hydronephrosis. Bladder is unremarkable. Stomach/Bowel: There is a small hiatal hernia. Appendix appears normal. No evidence of bowel wall thickening, distention, or inflammatory changes. Vascular/Lymphatic: There is mild aortic atherosclerosis. No enlarged abdominal or pelvic lymph nodes. Reproductive: A 3.4 cm x 2.2 cm calcified uterine fibroid is seen along the anterolateral aspect of the uterus on the left. The bilateral adnexa are unremarkable. Other: Musculoskeletal: There is marked severity dextroscoliosis of the lumbar spine with marked severity multilevel degenerative changes. IMPRESSION: 1. Findings consistent with acute pancreatitis. Correlation with pancreatic enzymes is  recommended. 2. Cholelithiasis. 3. Small hiatal hernia. 4. Calcified uterine fibroid. Electronically Signed   By: Virgina Norfolk M.D.   On: 02/21/2019 15:40    EKG: Independently reviewed.  Assessment/Plan Active Problems:   Pancreatitis, acute   Acute pancreatitis  Acute gallstone pancreatitis, discussed with on-call GI Dr. Gennaro Africa, recommend MRCP, and symptoms significantly improved after IV fluid and pain add in the ED, will try clear liquid diet for tonight.  Pain IV fluid and antibiotics.  GI recommend surgical consult once patient pancreatitis improved.  Stated patient's daughter regarding possible surgical plan for cholecystectomy, patient daughter said she will talk to the siblings.the possible surgical plan.  Leukocytosis, clear from passing gallstone through CBD, showed no CBD dilatation but elevated bilirubin level, does have episode of chills, will cover her with antibiotics.  Hypertension, her BP has been borderline in the ED will maintain IV fluid and hold all her BP meds, will do as needed hydralazine for now.  AKI, give some IV fluids and recheck tomorrow, cleared from dehydration after throwing up multiple times.  Bilirubinemia, likely from a passing gallstone event.  As above.  Diabetes, diet controlled, send A1c.   DVT prophylaxis: Subcu heparin Code Status: Full code Family Communication: Daughter over the phone Disposition Plan: Home Consults called: GI Dr. Gennaro Africa Admission status: MedSurg   Lequita Halt MD [facility] Pager [phone]  If 7PM-7AM, please contact night-coverage [email or web address] Password [facility]  02/21/2019, 7:44 PM

## 2019-02-21 NOTE — ED Notes (Signed)
Admitting provider at bedside.

## 2019-02-21 NOTE — ED Notes (Signed)
Pt ambulatory to bathroom, denies any distress.

## 2019-02-22 ENCOUNTER — Encounter (HOSPITAL_COMMUNITY): Payer: Self-pay | Admitting: Internal Medicine

## 2019-02-22 DIAGNOSIS — I10 Essential (primary) hypertension: Secondary | ICD-10-CM

## 2019-02-22 DIAGNOSIS — K859 Acute pancreatitis without necrosis or infection, unspecified: Secondary | ICD-10-CM

## 2019-02-22 DIAGNOSIS — E785 Hyperlipidemia, unspecified: Secondary | ICD-10-CM

## 2019-02-22 DIAGNOSIS — K851 Biliary acute pancreatitis without necrosis or infection: Principal | ICD-10-CM

## 2019-02-22 DIAGNOSIS — E1169 Type 2 diabetes mellitus with other specified complication: Secondary | ICD-10-CM

## 2019-02-22 HISTORY — DX: Acute pancreatitis without necrosis or infection, unspecified: K85.90

## 2019-02-22 LAB — COMPREHENSIVE METABOLIC PANEL
ALT: 606 U/L — ABNORMAL HIGH (ref 0–44)
AST: 497 U/L — ABNORMAL HIGH (ref 15–41)
Albumin: 3.3 g/dL — ABNORMAL LOW (ref 3.5–5.0)
Alkaline Phosphatase: 176 U/L — ABNORMAL HIGH (ref 38–126)
Anion gap: 9 (ref 5–15)
BUN: 15 mg/dL (ref 8–23)
CO2: 28 mmol/L (ref 22–32)
Calcium: 8.9 mg/dL (ref 8.9–10.3)
Chloride: 97 mmol/L — ABNORMAL LOW (ref 98–111)
Creatinine, Ser: 1.46 mg/dL — ABNORMAL HIGH (ref 0.44–1.00)
GFR calc Af Amer: 40 mL/min — ABNORMAL LOW (ref >60)
GFR calc non Af Amer: 35 mL/min — ABNORMAL LOW (ref >60)
Glucose, Bld: 136 mg/dL — ABNORMAL HIGH (ref 70–99)
Potassium: 3.9 mmol/L (ref 3.5–5.1)
Sodium: 134 mmol/L — ABNORMAL LOW (ref 135–145)
Total Bilirubin: 1.4 mg/dL — ABNORMAL HIGH (ref 0.3–1.2)
Total Protein: 6.6 g/dL (ref 6.5–8.1)

## 2019-02-22 LAB — CBC
HCT: 36.8 % (ref 36.0–46.0)
Hemoglobin: 12.9 g/dL (ref 12.0–15.0)
MCH: 28.7 pg (ref 26.0–34.0)
MCHC: 35.1 g/dL (ref 30.0–36.0)
MCV: 82 fL (ref 80.0–100.0)
Platelets: 267 10*3/uL (ref 150–400)
RBC: 4.49 MIL/uL (ref 3.87–5.11)
RDW: 14.3 % (ref 11.5–15.5)
WBC: 19.8 10*3/uL — ABNORMAL HIGH (ref 4.0–10.5)
nRBC: 0 % (ref 0.0–0.2)

## 2019-02-22 LAB — RESPIRATORY PANEL BY RT PCR (FLU A&B, COVID)
Influenza A by PCR: NEGATIVE
Influenza B by PCR: NEGATIVE
SARS Coronavirus 2 by RT PCR: NEGATIVE

## 2019-02-22 LAB — HEMOGLOBIN A1C
Hgb A1c MFr Bld: 6.6 % — ABNORMAL HIGH (ref 4.8–5.6)
Mean Plasma Glucose: 142.72 mg/dL

## 2019-02-22 LAB — GLUCOSE, CAPILLARY
Glucose-Capillary: 118 mg/dL — ABNORMAL HIGH (ref 70–99)
Glucose-Capillary: 189 mg/dL — ABNORMAL HIGH (ref 70–99)
Glucose-Capillary: 127 mg/dL — ABNORMAL HIGH (ref 70–99)
Glucose-Capillary: 104 mg/dL — ABNORMAL HIGH (ref 70–99)

## 2019-02-22 LAB — LIPASE, BLOOD: Lipase: 782 U/L — ABNORMAL HIGH (ref 11–51)

## 2019-02-22 MED ORDER — SODIUM CHLORIDE 0.9 % IV SOLN: INTRAVENOUS | Status: AC

## 2019-02-22 NOTE — Consult Note (Signed)
[facility] Surgery Consult Note  [name] Jul 04, 1940  [id number].    Requesting MD: Dr. Flora Lipps Chief Complaint/Reason for Consult: Gallstone pancreatitis   HPI: [name] is a 79 y.o. female with a history of HTN, HLD, DM who presented to [facility] on 1/4 with abdominal pain, nausea and vomiting.  Patient reports that her pain started on 1/3 as sore/sharp, severe, nonradiating pain in her epigastrium with associated anorexia, nausea as well as multiple episodes of emesis.  She initially thought this was acid reflux however her pain did not subside.  She tried Tylenol for this without any relief. She denies hx of similar pain in the past. No associated fever, chills, CP, SOB, or diarrhea.   In the ED workup significant for WBC 16.7, AST 67, T bili 3.7, and CT that showed findings consistent with acute pancreatitis w/ mild peripancreatic inflammatory fat stranding seen within the region of the tail of the pancreas. This also showed Numerous subcentimeter gallstones are seen within the gallbladder without evidence of gallbladder wall thickening or biliary dilatation.  Patient was admitted to medicine service.  She was placed on Rocephin/Flagyl and CLD. She underwent MRCP that showed pancreatitis, as well as cholelithiasis with mild intrahepatic biliary duct distension but no definite findings of choledocholithiasis.   Today WBC elevated to 19.8, Lipase up from 27 to 782, Alk phos elevated from 95 to 176, AST elevated from 67 to 497, ALT elevated from 31 to 606, T bili down from 3.7 to 1.4. General Surgery was asked to see and possibly take over patients care. Overall she states that she is feeling better, although still has some epigastric pain. Tolerating clear liquids. Denies n/v.  Abdominal surgical history: None Anticoagulants: None at home. On Subq Heparin here Never smoker No etoh use No illicit drug use Employment: Retired  Son lives at home with her  ROS: Review of  Systems  Constitutional: Negative for chills and fever.  HENT: Negative.   Eyes: Negative.   Respiratory: Negative.   Cardiovascular: Negative.   Gastrointestinal: Positive for abdominal pain, nausea and vomiting. Negative for constipation and diarrhea.  Genitourinary: Negative for dysuria and urgency.  Musculoskeletal: Positive for back pain.  Neurological: Negative.   All other systems reviewed and are negative.   Family History  Problem Relation Age of Onset  . Hypertension Mother   . Diabetes Mother   . Osteoporosis Sister   . Diabetes Brother     Past Medical History:  Diagnosis Date  . Acute pancreatitis 02/22/2019  . 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 Fibroids   . Gallstones    incidental asymptomatic gallstones  . Heart murmur   . Hyperlipidemia   . Hypertension     Past Surgical History:  Procedure Laterality Date  . NO PAST SURGERIES      Social History:  reports that she has never smoked. She has never used smokeless tobacco. She reports that she does not drink alcohol or use drugs.  Allergies:  Allergies  Allergen Reactions  . Ibuprofen     REACTION: palpitations  . Sulfamethoxazole-Trimethoprim     REACTION: rash    Medications Prior to Admission  Medication Sig Dispense Refill  . lisinopril (ZESTRIL) 40 MG tablet Take 1 tablet (40 mg total) by mouth daily. 90 tablet 3  . ACCU-CHEK FASTCLIX LANCETS MISC Check glucose once daily (Dx. E11.9) 102 each 1  . aspirin 81 MG tablet Take 81 mg by mouth daily.    Marland Kitchen  atorvastatin (LIPITOR) 10 MG tablet Take 1 tablet (10 mg total) by mouth daily. In evening 90 tablet 3  . Blood Glucose Monitoring Suppl (ACCU-CHEK AVIVA PLUS) w/Device KIT Use to check glucose once daily for DM 2 (Dx. E11.9) 1 kit 0  . cholecalciferol (VITAMIN D) 1000 UNITS tablet Take 1,000 Units by mouth daily.    Marland Kitchen glucose blood (ACCU-CHEK AVIVA PLUS) test strip Use to check blood sugar once daliy (dx. E11.9) 100 each 3  .  hydrochlorothiazide (HYDRODIURIL) 25 MG tablet Take 1 tablet (25 mg total) by mouth every morning. 90 tablet 3  . Multiple Vitamin (MULTIVITAMIN) tablet Take 1 tablet by mouth daily.      . potassium chloride SA (K-DUR) 20 MEQ tablet Take 1 tablet (20 mEq total) by mouth daily. 90 tablet 3    Prior to Admission medications   Medication Sig Start Date End Date Taking? Authorizing Provider  lisinopril (ZESTRIL) 40 MG tablet Take 1 tablet (40 mg total) by mouth daily. 06/28/18  Yes Tower, Wynelle Fanny, MD  ACCU-CHEK FASTCLIX LANCETS MISC Check glucose once daily (Dx. E11.9) 06/21/17   Tower, Wynelle Fanny, MD  aspirin 81 MG tablet Take 81 mg by mouth daily.    [provider]  atorvastatin (LIPITOR) 10 MG tablet Take 1 tablet (10 mg total) by mouth daily. In evening 06/28/18   Tower, Wynelle Fanny, MD  Blood Glucose Monitoring Suppl (ACCU-CHEK AVIVA PLUS) w/Device KIT Use to check glucose once daily for DM 2 (Dx. E11.9) 01/23/19   Tower, Wynelle Fanny, MD  cholecalciferol (VITAMIN D) 1000 UNITS tablet Take 1,000 Units by mouth daily.    [provider]  glucose blood (ACCU-CHEK AVIVA PLUS) test strip Use to check blood sugar once daliy (dx. E11.9) 06/28/18   Tower, Wynelle Fanny, MD  hydrochlorothiazide (HYDRODIURIL) 25 MG tablet Take 1 tablet (25 mg total) by mouth every morning. 06/28/18   Tower, Wynelle Fanny, MD  Multiple Vitamin (MULTIVITAMIN) tablet Take 1 tablet by mouth daily.      [provider]  potassium chloride SA (K-DUR) 20 MEQ tablet Take 1 tablet (20 mEq total) by mouth daily. 06/28/18   Tower, Wynelle Fanny, MD    Blood pressure 120/63, pulse 79, temperature 99.2 F (37.3 C), temperature source Oral, resp. rate 18, height 5' (1.524 m), weight 65.8 kg, SpO2 98 %. Physical Exam: General: pleasant, elderly AA female who is sitting up in chair in NAD HEENT: head is normocephalic, atraumatic.  Sclera are noninjected.  Pupils equal and round. No scleral icterus. Ears and nose without any masses or lesions.   Mouth is pink and moist. Dentition fair Heart: regular, rate, and rhythm.  No obvious murmurs, gallops, or rubs noted.  Palpable pedal pulses bilaterally Lungs: CTAB, no wheezes, rhonchi, or rales noted.  Respiratory effort nonlabored Abd: Soft, mild distension, mild epigastric tenderness without rebound, rigidity or guarding. Negative Murphy's sign. +BS, no masses, hernias, or organomegaly MS: calves soft and nontender without edema Skin: warm and dry with no masses, lesions, or rashes Psych: A&Ox3 with an appropriate affect. Neuro: cranial nerves grossly intact, extremity CSM intact bilaterally, normal speech  Results for orders placed or performed during the hospital encounter of 02/20/19 (from the past 48 hour(s))  Lipase, blood     Status: None   Collection Time: 02/20/19 10:43 PM  Result Value Ref Range   Lipase 27 11 - 51 U/L    Comment: Performed at [facility], [place],  [place] [address]  Comprehensive metabolic panel     Status: Abnormal   Collection Time: 02/20/19 10:43 PM  Result Value Ref Range   Sodium 129 (L) 135 - 145 mmol/L   Potassium 4.0 3.5 - 5.1 mmol/L   Chloride 89 (L) 98 - 111 mmol/L   CO2 28 22 - 32 mmol/L   Glucose, Bld 90 70 - 99 mg/dL   BUN 45 (H) 8 - 23 mg/dL   Creatinine, Ser 1.62 (H) 0.44 - 1.00 mg/dL   Calcium 9.3 8.9 - 10.3 mg/dL   Total Protein 7.9 6.5 - 8.1 g/dL   Albumin 3.3 (L) 3.5 - 5.0 g/dL   AST 67 (H) 15 - 41 U/L   ALT 31 0 - 44 U/L   Alkaline Phosphatase 95 38 - 126 U/L   Total Bilirubin 3.7 (H) 0.3 - 1.2 mg/dL   GFR calc non Af Amer 31 (L) >60 mL/min   GFR calc Af Amer 35 (L) >60 mL/min   Anion gap 12 5 - 15    Comment: Performed at [facility], [place] [address] [place], [place] [address]  CBC     Status: Abnormal   Collection Time: 02/20/19 10:43 PM  Result Value Ref Range   WBC 16.7 (H) 4.0 - 10.5 K/uL   RBC 4.87 3.87 - 5.11 MIL/uL   Hemoglobin 13.9 12.0 - 15.0 g/dL   HCT 40.1 36.0 - 46.0 %   MCV 82.3  80.0 - 100.0 fL   MCH 28.5 26.0 - 34.0 pg   MCHC 34.7 30.0 - 36.0 g/dL   RDW 13.6 11.5 - 15.5 %   Platelets 333 150 - 400 K/uL   nRBC 0.0 0.0 - 0.2 %    Comment: Performed at [facility], [place] [address], [place], [place] [address]  Troponin I (High Sensitivity)     Status: Abnormal   Collection Time: 02/20/19 10:43 PM  Result Value Ref Range   Troponin I (High Sensitivity) 70 (H) <18 ng/L    Comment: REPEATED TO VERIFY (NOTE) Elevated high sensitivity troponin I (hsTnI) values and significant  changes across serial measurements may suggest ACS but many other  chronic and acute conditions are known to elevate hsTnI results.  Refer to the Links section for chest pain algorithms and additional  guidance. Performed at [facility], [place] [address], [place], [place] [address]   Urinalysis, Routine w reflex microscopic     Status: Abnormal   Collection Time: 02/21/19 12:39 AM  Result Value Ref Range   Color, Urine AMBER (A) YELLOW    Comment: BIOCHEMICALS MAY BE AFFECTED BY COLOR   APPearance HAZY (A) CLEAR   Specific Gravity, Urine 1.011 1.005 - 1.030   pH 7.0 5.0 - 8.0   Glucose, UA NEGATIVE NEGATIVE mg/dL   Hgb urine dipstick NEGATIVE NEGATIVE   Bilirubin Urine NEGATIVE NEGATIVE   Ketones, ur NEGATIVE NEGATIVE mg/dL   Protein, ur 30 (A) NEGATIVE mg/dL   Nitrite NEGATIVE NEGATIVE   Leukocytes,Ua NEGATIVE NEGATIVE   RBC / HPF 0-5 0 - 5 RBC/hpf   WBC, UA 6-10 0 - 5 WBC/hpf   Bacteria, UA RARE (A) NONE SEEN   Squamous Epithelial / LPF 6-10 0 - 5   Mucus PRESENT    Hyaline Casts, UA PRESENT     Comment: Performed at [facility], [address] [place], [place] [address]  Troponin I (High Sensitivity)     Status: None  Collection Time: 02/21/19 12:50 AM  Result Value Ref Range   Troponin I (High Sensitivity) 6 <18 ng/L    Comment: REPEATED TO VERIFY Performed at [facility], [address], [place],  [address]   POC SARS Coronavirus 2 Ag-ED  - Nasal Swab (BD Veritor Kit)     Status: None   Collection Time: 02/21/19  4:55 PM  Result Value Ref Range   SARS Coronavirus 2 Ag NEGATIVE NEGATIVE    Comment: (NOTE) SARS-CoV-2 antigen NOT DETECTED.  Negative results are presumptive.  Negative results do not preclude SARS-CoV-2 infection and should not be used as the sole basis for treatment or other patient management decisions, including infection  control decisions, particularly in the presence of clinical signs and  symptoms consistent with COVID-19, or in those who have been in contact with the virus.  Negative results must be combined with clinical observations, patient history, and epidemiological information. The expected result is Negative. Fact Sheet for Patients: PodPark.tn Fact Sheet for Healthcare Providers: GiftContent.is This test is not yet approved or cleared by the [place] FDA and  has been authorized for detection and/or diagnosis of SARS-CoV-2 by FDA under an Emergency Use Authorization (EUA).  This EUA will remain in effect (meaning this test can be used) for the duration of  the COVID-19 de claration under Section 564(b)(1) of the Act, 21 U.S.C. section 360bbb-3(b)(1), unless the authorization is terminated or revoked sooner.   CBG monitoring, ED     Status: Abnormal   Collection Time: 02/21/19 11:24 PM  Result Value Ref Range   Glucose-Capillary 174 (H) 70 - 99 mg/dL  Respiratory Panel by RT PCR (Flu A&B, Covid) - Nasopharyngeal Swab     Status: None   Collection Time: 02/21/19 11:45 PM   Specimen: Nasopharyngeal Swab  Result Value Ref Range   SARS Coronavirus 2 by RT PCR NEGATIVE NEGATIVE    Comment: (NOTE) SARS-CoV-2 target nucleic acids are NOT DETECTED. The SARS-CoV-2 RNA is generally detectable in upper respiratoy specimens during the acute phase of infection. The lowest concentration of SARS-CoV-2 viral copies this assay can detect  is 131 copies/mL. A negative result does not preclude SARS-Cov-2 infection and should not be used as the sole basis for treatment or other patient management decisions. A negative result may occur with  improper specimen collection/handling, submission of specimen other than nasopharyngeal swab, presence of viral mutation(s) within the areas targeted by this assay, and inadequate number of viral copies (<131 copies/mL). A negative result must be combined with clinical observations, patient history, and epidemiological information. The expected result is Negative. Fact Sheet for Patients:  PinkCheek.be Fact Sheet for Healthcare Providers:  GravelBags.it This test is not yet ap proved or cleared by the [place] FDA and  has been authorized for detection and/or diagnosis of SARS-CoV-2 by FDA under an Emergency Use Authorization (EUA). This EUA will remain  in effect (meaning this test can be used) for the duration of the COVID-19 declaration under Section 564(b)(1) of the Act, 21 U.S.C. section 360bbb-3(b)(1), unless the authorization is terminated or revoked sooner.    Influenza A by PCR NEGATIVE NEGATIVE   Influenza B by PCR NEGATIVE NEGATIVE    Comment: (NOTE) The Xpert Xpress SARS-CoV-2/FLU/RSV assay is intended as an aid in  the diagnosis of influenza from Nasopharyngeal swab specimens and  should not be used as a sole basis for treatment. Nasal washings and  aspirates are unacceptable for Xpert Xpress SARS-CoV-2/FLU/RSV  testing. Fact  Sheet for Patients: PinkCheek.be Fact Sheet for Healthcare Providers: GravelBags.it This test is not yet approved or cleared by the [place] FDA and  has been authorized for detection and/or diagnosis of SARS-CoV-2 by  FDA under an Emergency Use Authorization (EUA). This EUA will remain  in effect (meaning this test can be  used) for the duration of the  Covid-19 declaration under Section 564(b)(1) of the Act, 21  U.S.C. section 360bbb-3(b)(1), unless the authorization is  terminated or revoked. Performed at [facility], [place] [address], [place], [place] [address]   Hemoglobin A1c     Status: Abnormal   Collection Time: 02/22/19  3:04 AM  Result Value Ref Range   Hgb A1c MFr Bld 6.6 (H) 4.8 - 5.6 %    Comment: (NOTE) Pre diabetes:          5.7%-6.4% Diabetes:              >6.4% Glycemic control for   <7.0% adults with diabetes    Mean Plasma Glucose 142.72 mg/dL    Comment: Performed at [facility] [address] [place], [place] [address]  Comprehensive metabolic panel     Status: Abnormal   Collection Time: 02/22/19  3:04 AM  Result Value Ref Range   Sodium 134 (L) 135 - 145 mmol/L   Potassium 3.9 3.5 - 5.1 mmol/L   Chloride 97 (L) 98 - 111 mmol/L   CO2 28 22 - 32 mmol/L   Glucose, Bld 136 (H) 70 - 99 mg/dL   BUN 15 8 - 23 mg/dL   Creatinine, Ser 1.46 (H) 0.44 - 1.00 mg/dL   Calcium 8.9 8.9 - 10.3 mg/dL   Total Protein 6.6 6.5 - 8.1 g/dL   Albumin 3.3 (L) 3.5 - 5.0 g/dL   AST 497 (H) 15 - 41 U/L   ALT 606 (H) 0 - 44 U/L   Alkaline Phosphatase 176 (H) 38 - 126 U/L   Total Bilirubin 1.4 (H) 0.3 - 1.2 mg/dL   GFR calc non Af Amer 35 (L) >60 mL/min   GFR calc Af Amer 40 (L) >60 mL/min   Anion gap 9 5 - 15    Comment: Performed at [facility], [place] [address], [place], [place] [address]  CBC     Status: Abnormal   Collection Time: 02/22/19  3:04 AM  Result Value Ref Range   WBC 19.8 (H) 4.0 - 10.5 K/uL   RBC 4.49 3.87 - 5.11 MIL/uL   Hemoglobin 12.9 12.0 - 15.0 g/dL   HCT 36.8 36.0 - 46.0 %   MCV 82.0 80.0 - 100.0 fL   MCH 28.7 26.0 - 34.0 pg   MCHC 35.1 30.0 - 36.0 g/dL   RDW 14.3 11.5 - 15.5 %   Platelets 267 150 - 400 K/uL   nRBC 0.0 0.0 - 0.2 %    Comment: Performed at [facility], [place] [address], [place], [place] [address]  Lipase, blood     Status:  Abnormal   Collection Time: 02/22/19  3:04 AM  Result Value Ref Range   Lipase 782 (H) 11 - 51 U/L    Comment: RESULTS CONFIRMED BY MANUAL DILUTION Performed at [facility], [place] [address], [place], [place] [address]   Glucose, capillary     Status: Abnormal   Collection Time: 02/22/19  7:32 AM  Result Value Ref Range   Glucose-Capillary 118 (H) 70 - 99 mg/dL  CT Abdomen Pelvis Wo Contrast  Result Date: 02/21/2019 CLINICAL DATA:  Nausea and vomiting. EXAM: CT ABDOMEN AND PELVIS WITHOUT CONTRAST TECHNIQUE: Multidetector CT imaging of the abdomen and pelvis was performed following the standard protocol without IV contrast. COMPARISON:  None. FINDINGS: Lower chest: No acute abnormality. Hepatobiliary: No focal liver abnormality is seen. Numerous subcentimeter gallstones are seen within the gallbladder without evidence of gallbladder wall thickening or biliary dilatation. Pancreas: Mild peripancreatic inflammatory fat stranding is seen within the region of the tail of the pancreas. Spleen: Normal in size without focal abnormality. Adrenals/Urinary Tract: Adrenal glands are unremarkable. Kidneys are normal, without renal calculi, focal lesion, or hydronephrosis. Bladder is unremarkable. Stomach/Bowel: There is a small hiatal hernia. Appendix appears normal. No evidence of bowel wall thickening, distention, or inflammatory changes. Vascular/Lymphatic: There is mild aortic atherosclerosis. No enlarged abdominal or pelvic lymph nodes. Reproductive: A 3.4 cm x 2.2 cm calcified uterine fibroid is seen along the anterolateral aspect of the uterus on the left. The bilateral adnexa are unremarkable. Other: Musculoskeletal: There is marked severity dextroscoliosis of the lumbar spine with marked severity multilevel degenerative changes. IMPRESSION: 1. Findings consistent with acute pancreatitis. Correlation with pancreatic enzymes is recommended. 2. Cholelithiasis. 3. Small hiatal hernia. 4. Calcified uterine  fibroid. Electronically Signed   By: Virgina Norfolk M.D.   On: 02/21/2019 15:40   MR ABDOMEN MRCP W WO CONTAST  Result Date: 02/22/2019 CLINICAL DATA:  Pancreatitis. EXAM: MRI ABDOMEN WITHOUT AND WITH CONTRAST (INCLUDING MRCP) TECHNIQUE: Multiplanar multisequence MR imaging of the abdomen was performed both before and after the administration of intravenous contrast. Heavily T2-weighted images of the biliary and pancreatic ducts were obtained, and three-dimensional MRCP images were rendered by post processing. CONTRAST:  6.47m GADAVIST GADOBUTROL 1 MMOL/ML IV SOLN COMPARISON:  CT 02/21/2019 FINDINGS: Lower chest: Unremarkable. Hepatobiliary: Evaluation of liver is motion degraded. Within this limitation, no gross liver lesion evident. Multiple stones are seen in the gallbladder measuring up to 2 cm diameter. Mild intrahepatic biliary duct dilatation noted with common bile duct diameter in the head of pancreas measuring upper normal at 6 mm. Axial single shot imaging shows some areas of flow artifact in the common duct, but dedicated fluid sensitive MRCP imaging shows no evidence for choledocholithiasis. Pancreas: Assessment of pancreatic parenchyma degraded by motion artifact. No gross pancreatic mass lesion evident. No substantial dilatation of the main pancreatic duct. Pancreatic and peripancreatic edema noted. No evidence of pancreas divisum. Spleen:  Grossly unremarkable. Adrenals/Urinary Tract: No adrenal nodule or mass. Kidneys unremarkable. Stomach/Bowel: Stomach is nondistended. No small bowel or colonic dilatation within the visualized abdomen. Vascular/Lymphatic: No abdominal aortic aneurysm. No abdominal lymphadenopathy. Other: Small volume free fluid. Fibroid changes noted in the uterus with prominent endometrium. Musculoskeletal: No abnormal marrow enhancement within the visualized bony anatomy IMPRESSION: 1. Cholelithiasis with mild intrahepatic biliary duct distension. Extrahepatic common duct  measures upper normal for size with no definite findings of choledocholithiasis. 2. Diffuse pancreatic and peripancreatic edema consistent with pancreatitis. No pancreas divisum. No dilatation of the main pancreatic duct. 3. Small volume intraperitoneal free fluid. 4. Fibroid change in the uterus with prominent endometrium in this postmenopausal female. Consider pelvic ultrasound to further evaluate. Electronically Signed   By: EMisty StanleyM.D.   On: 02/22/2019 08:12   Anti-infectives (From admission, onward)   Start     Dose/Rate Route Frequency Ordered Stop   02/21/19 1900  metroNIDAZOLE (FLAGYL) tablet 500 mg     500 mg Oral Every 8 hours 02/21/19  1716     02/21/19 1800  cefTRIAXone (ROCEPHIN) 1 g in sodium chloride 0.9 % 100 mL IVPB     1 g 200 mL/hr over 30 Minutes Intravenous Every 24 hours 02/21/19 1716        Assessment/Plan HTN HLD DM  Fibroid uterus - per primary, may need further workup   Gallstone Pancreatitis  Elevated LFT's - MRCP 1/6 w/out evidence of CBD stone.  - Lipase trending up today. Will need pancreatitis to resolve before considering Lap Chole. Will recheck in AM and make NPO after midnight given her pain is mild - Will defer taking over care until after patient is taken to OR. Her main problem right now is pancreatitis  - No indication for abx from a surgical standpoint. There is no evidence of cholecystitis on imaging - I have explained the procedure, risks, and aftercare of cholecystectomy.  Risks include but are not limited to bleeding, infection, wound problems, anesthesia, diarrhea, bile leak, injury to common bile duct/liver/intestine.  She seems to understand.  ID - Rocpehin/Flagyl 1/5 >> VTE - SCDs, Subq Heparin FEN - IVF, CLD  Wellington Hampshire, PA-C [facility] 02/22/2019, 11:20 AM Please see Amion for pager number during day hours 7:00am-4:30pm

## 2019-02-22 NOTE — Progress Notes (Signed)
PROGRESS NOTE  [name] [id number] DOB: December 08, 1940 DOA: 02/20/2019 PCP: Abner Greenspan, MD   LOS: 1 day   Brief narrative: As per HPI,  [name] is a 79 y.o. female with medical history significant of diet-controlled diabetes, hypertension, hyperlipidemia presented with abdominal pain, nausea, vomiting.  Symptoms started 2 days ago on the epigastric area. Initially she thought it is her GERD.  She tried Tylenol did not help. ED Course: CT showed acute pancreatitis and gallstone, with normal lipase, WBC count elevated.  Patient was then admitted to hospital for acute gallstone pancreatitis.  Assessment/Plan:  Active Problems:   Pancreatitis, acute   Acute pancreatitis  Acute gallstone pancreatitis, the attending provider had spoken with GI who recommended MRCP.  MRCP shows a gallstone with intrahepatic biliary dilatation but no choledocholithiasis.  Symptoms have improved.  Still unclear liquids.  Not required IV narcotic this morning.   Spoke with the patient regarding possible surgical plan for cholecystectomy.  Continue to hold statins for now.  Patient is on Rocephin and metronidazole.  Follow surgical recommendation.  Spoke with surgery.  Elevated LFT trending up.  Bilirubin slightly down.  Likely secondary to gallstone pancreatitis.  Leukocytosis,  mild fever.  Mild elevated bilirubin at 1.4 from 3.7.  Likely passed a stone.  Empirically on antibiotic  Essential hypertension,  as needed hydralazine for now.  Hold oral medication.  On lisinopril and HCTZ as outpatient.  Mild acute kidney injury likely secondary to volume depletion.  Vomiting.  Improved.  Creatinine of 1.4 today 1.6..  Diabetes mellitus, diet controlled, A1c however at 6.6.  Counseled about lifestyle modification.  Might need to be started on oral medication as per primary after discharge.  VTE Prophylaxis: Heparin subcu  Code Status: Full code  Family Communication: Spoke with the patient in  detail.  Disposition Plan: Home   Consultants:  GI  General surgery  Procedures:  MRCP  Antibiotics:  Anti-infectives (From admission, onward)   Start     Dose/Rate Route Frequency Ordered Stop   02/21/19 1900  metroNIDAZOLE (FLAGYL) tablet 500 mg     500 mg Oral Every 8 hours 02/21/19 1716     02/21/19 1800  cefTRIAXone (ROCEPHIN) 1 g in sodium chloride 0.9 % 100 mL IVPB     1 g 200 mL/hr over 30 Minutes Intravenous Every 24 hours 02/21/19 1716        Subjective: Patient feels better.  Denies nausea, vomiting or overt abdominal pain.  Did not need IV narcotics.  Objective: Vitals:   02/22/19 0225 02/22/19 0528  BP: 124/60 120/63  Pulse: 88 79  Resp: 20 18  Temp: 99.8 F (37.7 C) 99.2 F (37.3 C)  SpO2: 98% 98%    Intake/Output Summary (Last 24 hours) at 02/22/2019 0922 Last data filed at 02/21/2019 1856 Gross per 24 hour  Intake 2100 ml  Output --  Net 2100 ml   Filed Weights   02/20/19 2222  Weight: 65.8 kg   Body mass index is 28.32 kg/m.   Physical Exam: GENERAL: Patient is alert awake and communicative. Not in obvious distress. HENT: No scleral pallor or icterus. Pupils equally reactive to light. Oral mucosa is moist NECK: is supple, no palpable thyroid enlargement. CHEST: Clear to auscultation. No crackles or wheezes. Non tender on palpation. Diminished breath sounds bilaterally. CVS: S1 and S2 heard, no murmur. Regular rate and rhythm. No pericardial rub. ABDOMEN: Soft, mild epigastric tenderness on deep palpation, bowel sounds are present. EXTREMITIES: No edema. CNS:  Cranial nerves are intact. No focal motor or sensory deficits. SKIN: warm and dry without rashes.  Data Review: I have personally reviewed the following laboratory data and studies,  CBC: Recent Labs  Lab 02/20/19 2243 02/22/19 0304  WBC 16.7* 19.8*  HGB 13.9 12.9  HCT 40.1 36.8  MCV 82.3 82.0  PLT 333 [id number]   Basic Metabolic Panel: Recent Labs  Lab 02/20/19 2243  02/22/19 0304  NA 129* 134*  K 4.0 3.9  CL 89* 97*  CO2 28 28  GLUCOSE 90 136*  BUN 45* 15  CREATININE 1.62* 1.46*  CALCIUM 9.3 8.9   Liver Function Tests: Recent Labs  Lab 02/20/19 2243 02/22/19 0304  AST 67* 497*  ALT 31 606*  ALKPHOS 95 176*  BILITOT 3.7* 1.4*  PROT 7.9 6.6  ALBUMIN 3.3* 3.3*   Recent Labs  Lab 02/20/19 2243 02/22/19 0304  LIPASE 27 782*   No results for input(s): AMMONIA in the last 168 hours. Cardiac Enzymes: No results for input(s): CKTOTAL, CKMB, CKMBINDEX, TROPONINI in the last 168 hours. BNP (last 3 results) No results for input(s): BNP in the last 8760 hours.  ProBNP (last 3 results) No results for input(s): PROBNP in the last 8760 hours.  CBG: Recent Labs  Lab 02/21/19 2324 02/22/19 0732  GLUCAP 174* 118*   Recent Results (from the past 240 hour(s))  Respiratory Panel by RT PCR (Flu A&B, Covid) - Nasopharyngeal Swab     Status: None   Collection Time: 02/21/19 11:45 PM   Specimen: Nasopharyngeal Swab  Result Value Ref Range Status   SARS Coronavirus 2 by RT PCR NEGATIVE NEGATIVE Final    Comment: (NOTE) SARS-CoV-2 target nucleic acids are NOT DETECTED. The SARS-CoV-2 RNA is generally detectable in upper respiratoy specimens during the acute phase of infection. The lowest concentration of SARS-CoV-2 viral copies this assay can detect is 131 copies/mL. A negative result does not preclude SARS-Cov-2 infection and should not be used as the sole basis for treatment or other patient management decisions. A negative result may occur with  improper specimen collection/handling, submission of specimen other than nasopharyngeal swab, presence of viral mutation(s) within the areas targeted by this assay, and inadequate number of viral copies (<131 copies/mL). A negative result must be combined with clinical observations, patient history, and epidemiological information. The expected result is Negative. Fact Sheet for Patients:    PinkCheek.be Fact Sheet for Healthcare Providers:  GravelBags.it This test is not yet ap proved or cleared by the [place] FDA and  has been authorized for detection and/or diagnosis of SARS-CoV-2 by FDA under an Emergency Use Authorization (EUA). This EUA will remain  in effect (meaning this test can be used) for the duration of the COVID-19 declaration under Section 564(b)(1) of the Act, 21 U.S.C. section 360bbb-3(b)(1), unless the authorization is terminated or revoked sooner.    Influenza A by PCR NEGATIVE NEGATIVE Final   Influenza B by PCR NEGATIVE NEGATIVE Final    Comment: (NOTE) The Xpert Xpress SARS-CoV-2/FLU/RSV assay is intended as an aid in  the diagnosis of influenza from Nasopharyngeal swab specimens and  should not be used as a sole basis for treatment. Nasal washings and  aspirates are unacceptable for Xpert Xpress SARS-CoV-2/FLU/RSV  testing. Fact Sheet for Patients: PinkCheek.be Fact Sheet for Healthcare Providers: GravelBags.it This test is not yet approved or cleared by the [place] FDA and  has been authorized for detection and/or diagnosis of SARS-CoV-2 by  FDA under an Emergency Use  Authorization (EUA). This EUA will remain  in effect (meaning this test can be used) for the duration of the  Covid-19 declaration under Section 564(b)(1) of the Act, 21  U.S.C. section 360bbb-3(b)(1), unless the authorization is  terminated or revoked. Performed at [facility], [place] [address], [place], [place] [address]      Studies: CT Abdomen Pelvis Wo Contrast  Result Date: 02/21/2019 CLINICAL DATA:  Nausea and vomiting. EXAM: CT ABDOMEN AND PELVIS WITHOUT CONTRAST TECHNIQUE: Multidetector CT imaging of the abdomen and pelvis was performed following the standard protocol without IV contrast. COMPARISON:  None. FINDINGS: Lower chest: No acute  abnormality. Hepatobiliary: No focal liver abnormality is seen. Numerous subcentimeter gallstones are seen within the gallbladder without evidence of gallbladder wall thickening or biliary dilatation. Pancreas: Mild peripancreatic inflammatory fat stranding is seen within the region of the tail of the pancreas. Spleen: Normal in size without focal abnormality. Adrenals/Urinary Tract: Adrenal glands are unremarkable. Kidneys are normal, without renal calculi, focal lesion, or hydronephrosis. Bladder is unremarkable. Stomach/Bowel: There is a small hiatal hernia. Appendix appears normal. No evidence of bowel wall thickening, distention, or inflammatory changes. Vascular/Lymphatic: There is mild aortic atherosclerosis. No enlarged abdominal or pelvic lymph nodes. Reproductive: A 3.4 cm x 2.2 cm calcified uterine fibroid is seen along the anterolateral aspect of the uterus on the left. The bilateral adnexa are unremarkable. Other: Musculoskeletal: There is marked severity dextroscoliosis of the lumbar spine with marked severity multilevel degenerative changes. IMPRESSION: 1. Findings consistent with acute pancreatitis. Correlation with pancreatic enzymes is recommended. 2. Cholelithiasis. 3. Small hiatal hernia. 4. Calcified uterine fibroid. Electronically Signed   By: Virgina Norfolk M.D.   On: 02/21/2019 15:40   MR ABDOMEN MRCP W WO CONTAST  Result Date: 02/22/2019 CLINICAL DATA:  Pancreatitis. EXAM: MRI ABDOMEN WITHOUT AND WITH CONTRAST (INCLUDING MRCP) TECHNIQUE: Multiplanar multisequence MR imaging of the abdomen was performed both before and after the administration of intravenous contrast. Heavily T2-weighted images of the biliary and pancreatic ducts were obtained, and three-dimensional MRCP images were rendered by post processing. CONTRAST:  6.85mL GADAVIST GADOBUTROL 1 MMOL/ML IV SOLN COMPARISON:  CT 02/21/2019 FINDINGS: Lower chest: Unremarkable. Hepatobiliary: Evaluation of liver is motion degraded.  Within this limitation, no gross liver lesion evident. Multiple stones are seen in the gallbladder measuring up to 2 cm diameter. Mild intrahepatic biliary duct dilatation noted with common bile duct diameter in the head of pancreas measuring upper normal at 6 mm. Axial single shot imaging shows some areas of flow artifact in the common duct, but dedicated fluid sensitive MRCP imaging shows no evidence for choledocholithiasis. Pancreas: Assessment of pancreatic parenchyma degraded by motion artifact. No gross pancreatic mass lesion evident. No substantial dilatation of the main pancreatic duct. Pancreatic and peripancreatic edema noted. No evidence of pancreas divisum. Spleen:  Grossly unremarkable. Adrenals/Urinary Tract: No adrenal nodule or mass. Kidneys unremarkable. Stomach/Bowel: Stomach is nondistended. No small bowel or colonic dilatation within the visualized abdomen. Vascular/Lymphatic: No abdominal aortic aneurysm. No abdominal lymphadenopathy. Other: Small volume free fluid. Fibroid changes noted in the uterus with prominent endometrium. Musculoskeletal: No abnormal marrow enhancement within the visualized bony anatomy IMPRESSION: 1. Cholelithiasis with mild intrahepatic biliary duct distension. Extrahepatic common duct measures upper normal for size with no definite findings of choledocholithiasis. 2. Diffuse pancreatic and peripancreatic edema consistent with pancreatitis. No pancreas divisum. No dilatation of the main pancreatic duct. 3. Small volume intraperitoneal free fluid. 4. Fibroid change in the uterus with prominent endometrium in this  postmenopausal female. Consider pelvic ultrasound to further evaluate. Electronically Signed   By: Misty Stanley M.D.   On: 02/22/2019 08:12    Scheduled Meds: . aspirin EC  81 mg Oral Daily  . atorvastatin  10 mg Oral Daily  . cholecalciferol  1,000 Units Oral Daily  . heparin  5,000 Units Subcutaneous Q8H  . insulin aspart  0-9 Units Subcutaneous TID  WC  . lactobacillus acidophilus  2 tablet Oral TID  . metroNIDAZOLE  500 mg Oral Q8H  . multivitamin with minerals  1 tablet Oral Daily  . sodium chloride flush  3 mL Intravenous Once    Continuous Infusions: . sodium chloride 75 mL/hr at 02/21/19 1857  . cefTRIAXone (ROCEPHIN)  IV Stopped (02/21/19 1856)     Flora Lipps, MD  [facility] 02/22/2019

## 2019-02-22 NOTE — ED Notes (Signed)
ED TO INPATIENT HANDOFF REPORT  ED Nurse Name and Phone #: Gwyndolyn Saxon [phone]  S Name/Age/Gender [name] 79 y.o. female Room/Bed: 049C/049C  Code Status   Code Status: Full Code  Home/SNF/Other Home Patient oriented to: self, place, time and situation Is this baseline? Yes   Triage Complete: Triage complete  Chief Complaint Acute pancreatitis [K85.90]  Triage Note Pt c/o 8/10 epigastric pain since this morning with nausea and vomiting.    Allergies Allergies  Allergen Reactions  . Ibuprofen     REACTION: palpitations  . Sulfamethoxazole-Trimethoprim     REACTION: rash    Level of Care/Admitting Diagnosis ED Disposition    ED Disposition Condition [facility] Area: [place] [[id number]]  Level of Care: Med-Surg [16]  Covid Evaluation: Asymptomatic Screening Protocol (No Symptoms)  Diagnosis: Acute pancreatitis [577.0.ICD-9-CM]  Admitting Physician: Lequita Halt [[id number]]  Attending Physician: Lequita Halt [[id number]]  Estimated length of stay: 3 - 4 days  Certification:: I certify this patient will need inpatient services for at least 2 midnights       B Medical/Surgery History Past Medical History:  Diagnosis Date  . 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 Fibroids   . Gallstones    incidental asymptomatic gallstones  . Heart murmur   . Hyperlipidemia   . Hypertension    History reviewed. No pertinent surgical history.   A IV Location/Drains/Wounds Patient Lines/Drains/Airways Status   Active Line/Drains/Airways    Name:   Placement date:   Placement time:   Site:   Days:   Peripheral IV 02/21/19 Right Hand   02/21/19    1208    Hand   1          Intake/Output Last 24 hours  Intake/Output Summary (Last 24 hours) at 02/22/2019 0143 Last data filed at 02/21/2019 1856 Gross per 24 hour  Intake 2100 ml  Output --  Net 2100 ml    Labs/Imaging Results for orders placed or performed during the hospital  encounter of 02/20/19 (from the past 48 hour(s))  Lipase, blood     Status: None   Collection Time: 02/20/19 10:43 PM  Result Value Ref Range   Lipase 27 11 - 51 U/L    Comment: Performed at [facility], [place] [address] [place], [place] [address]  Comprehensive metabolic panel     Status: Abnormal   Collection Time: 02/20/19 10:43 PM  Result Value Ref Range   Sodium 129 (L) 135 - 145 mmol/L   Potassium 4.0 3.5 - 5.1 mmol/L   Chloride 89 (L) 98 - 111 mmol/L   CO2 28 22 - 32 mmol/L   Glucose, Bld 90 70 - 99 mg/dL   BUN 45 (H) 8 - 23 mg/dL   Creatinine, Ser 1.62 (H) 0.44 - 1.00 mg/dL   Calcium 9.3 8.9 - 10.3 mg/dL   Total Protein 7.9 6.5 - 8.1 g/dL   Albumin 3.3 (L) 3.5 - 5.0 g/dL   AST 67 (H) 15 - 41 U/L   ALT 31 0 - 44 U/L   Alkaline Phosphatase 95 38 - 126 U/L   Total Bilirubin 3.7 (H) 0.3 - 1.2 mg/dL   GFR calc non Af Amer 31 (L) >60 mL/min   GFR calc Af Amer 35 (L) >60 mL/min   Anion gap 12 5 - 15    Comment: Performed at [facility], [place] [address], [place], [place] [address]  CBC  Status: Abnormal   Collection Time: 02/20/19 10:43 PM  Result Value Ref Range   WBC 16.7 (H) 4.0 - 10.5 K/uL   RBC 4.87 3.87 - 5.11 MIL/uL   Hemoglobin 13.9 12.0 - 15.0 g/dL   HCT 40.1 36.0 - 46.0 %   MCV 82.3 80.0 - 100.0 fL   MCH 28.5 26.0 - 34.0 pg   MCHC 34.7 30.0 - 36.0 g/dL   RDW 13.6 11.5 - 15.5 %   Platelets 333 150 - 400 K/uL   nRBC 0.0 0.0 - 0.2 %    Comment: Performed at [facility], [place] [address] [place], [place] [address]  Troponin I (High Sensitivity)     Status: Abnormal   Collection Time: 02/20/19 10:43 PM  Result Value Ref Range   Troponin I (High Sensitivity) 70 (H) <18 ng/L    Comment: REPEATED TO VERIFY (NOTE) Elevated high sensitivity troponin I (hsTnI) values and significant  changes across serial measurements may suggest ACS but many other  chronic and acute conditions are known to elevate hsTnI results.  Refer to the Links section for  chest pain algorithms and additional  guidance. Performed at [facility], [place] [address], [place], [place] [address]   Urinalysis, Routine w reflex microscopic     Status: Abnormal   Collection Time: 02/21/19 12:39 AM  Result Value Ref Range   Color, Urine AMBER (A) YELLOW    Comment: BIOCHEMICALS MAY BE AFFECTED BY COLOR   APPearance HAZY (A) CLEAR   Specific Gravity, Urine 1.011 1.005 - 1.030   pH 7.0 5.0 - 8.0   Glucose, UA NEGATIVE NEGATIVE mg/dL   Hgb urine dipstick NEGATIVE NEGATIVE   Bilirubin Urine NEGATIVE NEGATIVE   Ketones, ur NEGATIVE NEGATIVE mg/dL   Protein, ur 30 (A) NEGATIVE mg/dL   Nitrite NEGATIVE NEGATIVE   Leukocytes,Ua NEGATIVE NEGATIVE   RBC / HPF 0-5 0 - 5 RBC/hpf   WBC, UA 6-10 0 - 5 WBC/hpf   Bacteria, UA RARE (A) NONE SEEN   Squamous Epithelial / LPF 6-10 0 - 5   Mucus PRESENT    Hyaline Casts, UA PRESENT     Comment: Performed at [facility], [address], [place], [place] [address]  Troponin I (High Sensitivity)     Status: None   Collection Time: 02/21/19 12:50 AM  Result Value Ref Range   Troponin I (High Sensitivity) 6 <18 ng/L    Comment: REPEATED TO VERIFY Performed at [facility] [address] [place], [place] [address]   POC SARS Coronavirus 2 Ag-ED - Nasal Swab (BD Veritor Kit)     Status: None   Collection Time: 02/21/19  4:55 PM  Result Value Ref Range   SARS Coronavirus 2 Ag NEGATIVE NEGATIVE    Comment: (NOTE) SARS-CoV-2 antigen NOT DETECTED.  Negative results are presumptive.  Negative results do not preclude SARS-CoV-2 infection and should not be used as the sole basis for treatment or other patient management decisions, including infection  control decisions, particularly in the presence of clinical signs and  symptoms consistent with COVID-19, or in those who have been in contact with the virus.  Negative results must be combined with clinical observations, patient history, and  epidemiological information. The expected result is Negative. Fact Sheet for Patients: PodPark.tn Fact Sheet for Healthcare Providers: GiftContent.is This test is not yet approved or cleared by the [place] FDA and  has been authorized for detection and/or diagnosis of  SARS-CoV-2 by FDA under an Emergency Use Authorization (EUA).  This EUA will remain in effect (meaning this test can be used) for the duration of  the COVID-19 de claration under Section 564(b)(1) of the Act, 21 U.S.C. section 360bbb-3(b)(1), unless the authorization is terminated or revoked sooner.   CBG monitoring, ED     Status: Abnormal   Collection Time: 02/21/19 11:24 PM  Result Value Ref Range   Glucose-Capillary 174 (H) 70 - 99 mg/dL  Respiratory Panel by RT PCR (Flu A&B, Covid) - Nasopharyngeal Swab     Status: None   Collection Time: 02/21/19 11:45 PM   Specimen: Nasopharyngeal Swab  Result Value Ref Range   SARS Coronavirus 2 by RT PCR NEGATIVE NEGATIVE    Comment: (NOTE) SARS-CoV-2 target nucleic acids are NOT DETECTED. The SARS-CoV-2 RNA is generally detectable in upper respiratoy specimens during the acute phase of infection. The lowest concentration of SARS-CoV-2 viral copies this assay can detect is 131 copies/mL. A negative result does not preclude SARS-Cov-2 infection and should not be used as the sole basis for treatment or other patient management decisions. A negative result may occur with  improper specimen collection/handling, submission of specimen other than nasopharyngeal swab, presence of viral mutation(s) within the areas targeted by this assay, and inadequate number of viral copies (<131 copies/mL). A negative result must be combined with clinical observations, patient history, and epidemiological information. The expected result is Negative. Fact Sheet for Patients:  PinkCheek.be Fact Sheet for  Healthcare Providers:  GravelBags.it This test is not yet ap proved or cleared by the [place] FDA and  has been authorized for detection and/or diagnosis of SARS-CoV-2 by FDA under an Emergency Use Authorization (EUA). This EUA will remain  in effect (meaning this test can be used) for the duration of the COVID-19 declaration under Section 564(b)(1) of the Act, 21 U.S.C. section 360bbb-3(b)(1), unless the authorization is terminated or revoked sooner.    Influenza A by PCR NEGATIVE NEGATIVE   Influenza B by PCR NEGATIVE NEGATIVE    Comment: (NOTE) The Xpert Xpress SARS-CoV-2/FLU/RSV assay is intended as an aid in  the diagnosis of influenza from Nasopharyngeal swab specimens and  should not be used as a sole basis for treatment. Nasal washings and  aspirates are unacceptable for Xpert Xpress SARS-CoV-2/FLU/RSV  testing. Fact Sheet for Patients: PinkCheek.be Fact Sheet for Healthcare Providers: GravelBags.it This test is not yet approved or cleared by the [place] FDA and  has been authorized for detection and/or diagnosis of SARS-CoV-2 by  FDA under an Emergency Use Authorization (EUA). This EUA will remain  in effect (meaning this test can be used) for the duration of the  Covid-19 declaration under Section 564(b)(1) of the Act, 21  U.S.C. section 360bbb-3(b)(1), unless the authorization is  terminated or revoked. Performed at [facility], [place] [address], [place], [place] [address]    CT Abdomen Pelvis Wo Contrast  Result Date: 02/21/2019 CLINICAL DATA:  Nausea and vomiting. EXAM: CT ABDOMEN AND PELVIS WITHOUT CONTRAST TECHNIQUE: Multidetector CT imaging of the abdomen and pelvis was performed following the standard protocol without IV contrast. COMPARISON:  None. FINDINGS: Lower chest: No acute abnormality. Hepatobiliary: No focal liver abnormality is seen. Numerous subcentimeter  gallstones are seen within the gallbladder without evidence of gallbladder wall thickening or biliary dilatation. Pancreas: Mild peripancreatic inflammatory fat stranding is seen within the region of the tail of the pancreas. Spleen: Normal in size without focal abnormality. Adrenals/Urinary Tract: Adrenal  glands are unremarkable. Kidneys are normal, without renal calculi, focal lesion, or hydronephrosis. Bladder is unremarkable. Stomach/Bowel: There is a small hiatal hernia. Appendix appears normal. No evidence of bowel wall thickening, distention, or inflammatory changes. Vascular/Lymphatic: There is mild aortic atherosclerosis. No enlarged abdominal or pelvic lymph nodes. Reproductive: A 3.4 cm x 2.2 cm calcified uterine fibroid is seen along the anterolateral aspect of the uterus on the left. The bilateral adnexa are unremarkable. Other: Musculoskeletal: There is marked severity dextroscoliosis of the lumbar spine with marked severity multilevel degenerative changes. IMPRESSION: 1. Findings consistent with acute pancreatitis. Correlation with pancreatic enzymes is recommended. 2. Cholelithiasis. 3. Small hiatal hernia. 4. Calcified uterine fibroid. Electronically Signed   By: Virgina Norfolk M.D.   On: 02/21/2019 15:40    Pending Labs Unresulted Labs (From admission, onward)    Start     Ordered   02/22/19 0500  Comprehensive metabolic panel  Tomorrow morning,   R     02/21/19 1716   02/22/19 0500  CBC  Tomorrow morning,   R     02/21/19 1716   02/22/19 0500  Lipase, blood  Tomorrow morning,   R     02/21/19 1716   02/21/19 1712  Hemoglobin A1c  Once,   STAT    Comments: To assess prior glycemic control    02/21/19 1716          Vitals/Pain Today's Vitals   02/21/19 1928 02/21/19 2230 02/21/19 2300 02/21/19 2314  BP:   130/64   Pulse:      Resp:  15 19   Temp:      TempSrc:      SpO2:  95%    Weight:      Height:      PainSc: 0-No pain   Asleep    Isolation Precautions No  active isolations  Medications Medications  sodium chloride flush (NS) 0.9 % injection 3 mL (has no administration in time range)  aspirin EC tablet 81 mg (81 mg Oral Given 02/21/19 1816)  atorvastatin (LIPITOR) tablet 10 mg (10 mg Oral Given 02/21/19 1816)  cholecalciferol (VITAMIN D3) tablet 1,000 Units (has no administration in time range)  multivitamin with minerals tablet 1 tablet (has no administration in time range)  hydrALAZINE (APRESOLINE) tablet 25 mg (has no administration in time range)  heparin injection 5,000 Units (5,000 Units Subcutaneous Given 02/21/19 2139)  0.9 %  sodium chloride infusion ( Intravenous New Bag/Given 02/21/19 1857)  acetaminophen (TYLENOL) tablet 650 mg (has no administration in time range)    Or  acetaminophen (TYLENOL) suppository 650 mg (has no administration in time range)  oxyCODONE (Oxy IR/ROXICODONE) immediate release tablet 5 mg (has no administration in time range)  ondansetron (ZOFRAN) tablet 4 mg (has no administration in time range)    Or  ondansetron (ZOFRAN) injection 4 mg (has no administration in time range)  HYDROmorphone (DILAUDID) injection 0.5 mg (has no administration in time range)  cefTRIAXone (ROCEPHIN) 1 g in sodium chloride 0.9 % 100 mL IVPB (0 g Intravenous Stopped 02/21/19 1856)  metroNIDAZOLE (FLAGYL) tablet 500 mg (500 mg Oral Given 02/21/19 2138)  lactobacillus acidophilus (BACID) tablet 2 tablet (2 tablets Oral Given 02/21/19 2139)  insulin aspart (novoLOG) injection 0-9 Units (has no administration in time range)  ondansetron (ZOFRAN-ODT) disintegrating tablet 4 mg (4 mg Oral Given 02/20/19 2310)  sodium chloride 0.9 % bolus 1,000 mL (0 mLs Intravenous Stopped 02/21/19 1839)  fentaNYL (SUBLIMAZE) injection 25 mcg (25 mcg Intravenous Given  02/21/19 1209)  sodium chloride 0.9 % bolus 1,000 mL (0 mLs Intravenous Stopped 02/21/19 1839)  morphine 4 MG/ML injection 4 mg (4 mg Intravenous Given 02/21/19 1618)  ondansetron (ZOFRAN) injection 4 mg (4 mg  Intravenous Given 02/21/19 1625)  gadobutrol (GADAVIST) 1 MMOL/ML injection 6.5 mL (6.5 mLs Intravenous Contrast Given 02/21/19 2105)    Mobility walks Moderate fall risk   Focused Assessments    R Recommendations: See Admitting Provider Note  Report given to:   Additional Notes: Pt is A&Ox 4 and ambulatory; pt has has no recent n/v nor pain; Patient has been sleeping peacefully for the past few hours; Last pain meds given at 16:18 02/21/2019

## 2019-02-22 NOTE — Consult Note (Signed)
Reason for Consult: Acute pancreatitis. Referring Physician: IMTS  [name] is an 79 y.o. female.  HPI: [name] a Sandstrom is a 79 year old black female with multiple medical problems listed below who presented to the emergency room at [facility] with history of acute periumbilical abdominal pain that began couple of days ago associated with nausea and vomiting.  She has tried some Tylenol which did not help she then presented to the emergency room where a CT showed acute pancreatitis with gallstones.  MRI and MRCP done today showed cholelithiasis with mild intrahepatic biliary ductal dilatation and extrahepatic common bile duct was at the upper limit of normal in size with no evidence of choledocholithiasis diffuse pancreatic and peripancreatic edema was noted consistent with acute pancreatitis with no evidence of pancreas divisum.  The main pancreatic duct appeared normal in size fibroid changes were noted in the uterus with a prominent endometrium. Patient claims she is feeling much better now with abdominal pain and nausea has resolved. She is tolerating clear liquids well.  Her lipase is 782 with a AST of 497 ALT of 606.  With a total bilirubin of 1.4 and alkaline phosphatase of 176 and white count increased from 19 point 8K from 16.7.  Total bili has improved it was 3.7 on admission.  Past Medical History:  Diagnosis Date  . Acute pancreatitis 02/22/2019  . 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 Fibroids    incidental asymptomatic gallstones  . Heart murmur   . Hyperlipidemia   . Hypertension    Past Surgical History:  Procedure Laterality Date  . NO PAST SURGERIES     Family History  Problem Relation Age of Onset  . Hypertension Mother   . Diabetes Mother   . Osteoporosis Sister   . Diabetes Brother    Social History:  reports that she has never smoked. She has never used smokeless tobacco. She reports that she does not drink alcohol or use drugs.  Allergies:  Allergies   Allergen Reactions  . Ibuprofen     REACTION: palpitations  . Sulfamethoxazole-Trimethoprim     REACTION: rash   Medications: I have reviewed the patient's current medications.  Results for orders placed or performed during the hospital encounter of 02/20/19 (from the past 48 hour(s))  Lipase, blood     Status: [name]   Collection Time: 02/20/19 10:43 PM  Result Value Ref Range   Lipase 27 11 - 51 U/L    Comment: Performed at [facility], [place] [address], [place], [place] [address]  Comprehensive metabolic panel     Status: Abnormal   Collection Time: 02/20/19 10:43 PM  Result Value Ref Range   Sodium 129 (L) 135 - 145 mmol/L   Potassium 4.0 3.5 - 5.1 mmol/L   Chloride 89 (L) 98 - 111 mmol/L   CO2 28 22 - 32 mmol/L   Glucose, Bld 90 70 - 99 mg/dL   BUN 45 (H) 8 - 23 mg/dL   Creatinine, Ser 1.62 (H) 0.44 - 1.00 mg/dL   Calcium 9.3 8.9 - 10.3 mg/dL   Total Protein 7.9 6.5 - 8.1 g/dL   Albumin 3.3 (L) 3.5 - 5.0 g/dL   AST 67 (H) 15 - 41 U/L   ALT 31 0 - 44 U/L   Alkaline Phosphatase 95 38 - 126 U/L   Total Bilirubin 3.7 (H) 0.3 - 1.2 mg/dL   GFR calc non Af Amer 31 (L) >60 mL/min   GFR calc Af Wyvonnia Lora  35 (L) >60 mL/min   Anion gap 12 5 - 15    Comment: Performed at [place] [address] [place], [place] [address]  CBC     Status: Abnormal   Collection Time: 02/20/19 10:43 PM  Result Value Ref Range   WBC 16.7 (H) 4.0 - 10.5 K/uL   RBC 4.87 3.87 - 5.11 MIL/uL   Hemoglobin 13.9 12.0 - 15.0 g/dL   HCT 40.1 36.0 - 46.0 %   MCV 82.3 80.0 - 100.0 fL   MCH 28.5 26.0 - 34.0 pg   MCHC 34.7 30.0 - 36.0 g/dL   RDW 13.6 11.5 - 15.5 %   Platelets 333 150 - 400 K/uL   nRBC 0.0 0.0 - 0.2 %    Comment: Performed at [facility], [place] [address] [place], [place] [address]  Troponin I (High Sensitivity)     Status: Abnormal   Collection Time: 02/20/19 10:43 PM  Result Value Ref Range   Troponin I (High Sensitivity) 70 (H) <18 ng/L    Comment: REPEATED TO  VERIFY (NOTE) Elevated high sensitivity troponin I (hsTnI) values and significant  changes across serial measurements may suggest ACS but many other  chronic and acute conditions are known to elevate hsTnI results.  Refer to the Links section for chest pain algorithms and additional  guidance. Performed at [facility], [place] [address], [place], [place] [address]   Urinalysis, Routine w reflex microscopic     Status: Abnormal   Collection Time: 02/21/19 12:39 AM  Result Value Ref Range   Color, Urine AMBER (A) YELLOW    Comment: BIOCHEMICALS MAY BE AFFECTED BY COLOR   APPearance HAZY (A) CLEAR   Specific Gravity, Urine 1.011 1.005 - 1.030   pH 7.0 5.0 - 8.0   Glucose, UA NEGATIVE NEGATIVE mg/dL   Hgb urine dipstick NEGATIVE NEGATIVE   Bilirubin Urine NEGATIVE NEGATIVE   Ketones, ur NEGATIVE NEGATIVE mg/dL   Protein, ur 30 (A) NEGATIVE mg/dL   Nitrite NEGATIVE NEGATIVE   Leukocytes,Ua NEGATIVE NEGATIVE   RBC / HPF 0-5 0 - 5 RBC/hpf   WBC, UA 6-10 0 - 5 WBC/hpf   Bacteria, UA RARE (A) [name] SEEN   Squamous Epithelial / LPF 6-10 0 - 5   Mucus PRESENT    Hyaline Casts, UA PRESENT     Comment: Performed at [facility], [address] [place], [place] [address]  Troponin I (High Sensitivity)     Status: [name]   Collection Time: 02/21/19 12:50 AM  Result Value Ref Range   Troponin I (High Sensitivity) 6 <18 ng/L    Comment: REPEATED TO VERIFY Performed at [place] [address] [place], [place] [address]   POC SARS Coronavirus 2 Ag-ED - Nasal Swab (BD Veritor Kit)     Status: [name]   Collection Time: 02/21/19  4:55 PM  Result Value Ref Range   SARS Coronavirus 2 Ag NEGATIVE NEGATIVE    Comment: (NOTE) SARS-CoV-2 antigen NOT DETECTED.  Negative results are presumptive.  Negative results do not preclude SARS-CoV-2 infection and should not be used as the sole basis for treatment or other patient management decisions, including infection  control decisions,  particularly in the presence of clinical signs and  symptoms consistent with COVID-19, or in those who have been in contact with the virus.  Negative results must be combined with clinical observations, patient history, and epidemiological information. The expected result is  Negative. Fact Sheet for Patients: PodPark.tn Fact Sheet for Healthcare Providers: GiftContent.is This test is not yet approved or cleared by the [place] FDA and  has been authorized for detection and/or diagnosis of SARS-CoV-2 by FDA under an Emergency Use Authorization (EUA).  This EUA will remain in effect (meaning this test can be used) for the duration of  the COVID-19 de claration under Section 564(b)(1) of the Act, 21 U.S.C. section 360bbb-3(b)(1), unless the authorization is terminated or revoked sooner.   CBG monitoring, ED     Status: Abnormal   Collection Time: 02/21/19 11:24 PM  Result Value Ref Range   Glucose-Capillary 174 (H) 70 - 99 mg/dL  Respiratory Panel by RT PCR (Flu A&B, Covid) - Nasopharyngeal Swab     Status: [name]   Collection Time: 02/21/19 11:45 PM   Specimen: Nasopharyngeal Swab  Result Value Ref Range   SARS Coronavirus 2 by RT PCR NEGATIVE NEGATIVE    Comment: (NOTE) SARS-CoV-2 target nucleic acids are NOT DETECTED. The SARS-CoV-2 RNA is generally detectable in upper respiratoy specimens during the acute phase of infection. The lowest concentration of SARS-CoV-2 viral copies this assay can detect is 131 copies/mL. A negative result does not preclude SARS-Cov-2 infection and should not be used as the sole basis for treatment or other patient management decisions. A negative result may occur with  improper specimen collection/handling, submission of specimen other than nasopharyngeal swab, presence of viral mutation(s) within the areas targeted by this assay, and inadequate number of viral copies (<131 copies/mL). A  negative result must be combined with clinical observations, patient history, and epidemiological information. The expected result is Negative. Fact Sheet for Patients:  PinkCheek.be Fact Sheet for Healthcare Providers:  GravelBags.it This test is not yet ap proved or cleared by the [place] FDA and  has been authorized for detection and/or diagnosis of SARS-CoV-2 by FDA under an Emergency Use Authorization (EUA). This EUA will remain  in effect (meaning this test can be used) for the duration of the COVID-19 declaration under Section 564(b)(1) of the Act, 21 U.S.C. section 360bbb-3(b)(1), unless the authorization is terminated or revoked sooner.    Influenza A by PCR NEGATIVE NEGATIVE   Influenza B by PCR NEGATIVE NEGATIVE    Comment: (NOTE) The Xpert Xpress SARS-CoV-2/FLU/RSV assay is intended as an aid in  the diagnosis of influenza from Nasopharyngeal swab specimens and  should not be used as a sole basis for treatment. Nasal washings and  aspirates are unacceptable for Xpert Xpress SARS-CoV-2/FLU/RSV  testing. Fact Sheet for Patients: PinkCheek.be Fact Sheet for Healthcare Providers: GravelBags.it This test is not yet approved or cleared by the [place] FDA and  has been authorized for detection and/or diagnosis of SARS-CoV-2 by  FDA under an Emergency Use Authorization (EUA). This EUA will remain  in effect (meaning this test can be used) for the duration of the  Covid-19 declaration under Section 564(b)(1) of the Act, 21  U.S.C. section 360bbb-3(b)(1), unless the authorization is  terminated or revoked. Performed at [facility], [place] [address], [place], [place] [address]   Hemoglobin A1c     Status: Abnormal   Collection Time: 02/22/19  3:04 AM  Result Value Ref Range   Hgb A1c MFr Bld 6.6 (H) 4.8 - 5.6 %    Comment: (NOTE) Pre diabetes:           5.7%-6.4% Diabetes:              >6.4% Glycemic  control for   <7.0% adults with diabetes    Mean Plasma Glucose 142.72 mg/dL    Comment: Performed at [facility], [place] [address], [place], [place] [address]  Comprehensive metabolic panel     Status: Abnormal   Collection Time: 02/22/19  3:04 AM  Result Value Ref Range   Sodium 134 (L) 135 - 145 mmol/L   Potassium 3.9 3.5 - 5.1 mmol/L   Chloride 97 (L) 98 - 111 mmol/L   CO2 28 22 - 32 mmol/L   Glucose, Bld 136 (H) 70 - 99 mg/dL   BUN 15 8 - 23 mg/dL   Creatinine, Ser 1.46 (H) 0.44 - 1.00 mg/dL   Calcium 8.9 8.9 - 10.3 mg/dL   Total Protein 6.6 6.5 - 8.1 g/dL   Albumin 3.3 (L) 3.5 - 5.0 g/dL   AST 497 (H) 15 - 41 U/L   ALT 606 (H) 0 - 44 U/L   Alkaline Phosphatase 176 (H) 38 - 126 U/L   Total Bilirubin 1.4 (H) 0.3 - 1.2 mg/dL   GFR calc non Af Amer 35 (L) >60 mL/min   GFR calc Af Amer 40 (L) >60 mL/min   Anion gap 9 5 - 15    Comment: Performed at [facility], [place] [address], [place], [place] [address]  CBC     Status: Abnormal   Collection Time: 02/22/19  3:04 AM  Result Value Ref Range   WBC 19.8 (H) 4.0 - 10.5 K/uL   RBC 4.49 3.87 - 5.11 MIL/uL   Hemoglobin 12.9 12.0 - 15.0 g/dL   HCT 36.8 36.0 - 46.0 %   MCV 82.0 80.0 - 100.0 fL   MCH 28.7 26.0 - 34.0 pg   MCHC 35.1 30.0 - 36.0 g/dL   RDW 14.3 11.5 - 15.5 %   Platelets 267 150 - 400 K/uL   nRBC 0.0 0.0 - 0.2 %    Comment: Performed at [facility], [place] [address], [place], [place] [address]  Lipase, blood     Status: Abnormal   Collection Time: 02/22/19  3:04 AM  Result Value Ref Range   Lipase 782 (H) 11 - 51 U/L    Comment: RESULTS CONFIRMED BY MANUAL DILUTION Performed at [facility], [place] [address] [place], [place] [address]   Glucose, capillary     Status: Abnormal   Collection Time: 02/22/19  7:32 AM  Result Value Ref Range   Glucose-Capillary 118 (H) 70 - 99 mg/dL  Glucose, capillary     Status: Abnormal   Collection  Time: 02/22/19 11:33 AM  Result Value Ref Range   Glucose-Capillary 189 (H) 70 - 99 mg/dL    CT Abdomen Pelvis Wo Contrast  Result Date: 02/21/2019 CLINICAL DATA:  Nausea and vomiting. EXAM: CT ABDOMEN AND PELVIS WITHOUT CONTRAST TECHNIQUE: Multidetector CT imaging of the abdomen and pelvis was performed following the standard protocol without IV contrast. COMPARISON:  [name]. FINDINGS: Lower chest: No acute abnormality. Hepatobiliary: No focal liver abnormality is seen. Numerous subcentimeter gallstones are seen within the gallbladder without evidence of gallbladder wall thickening or biliary dilatation. Pancreas: Mild peripancreatic inflammatory fat stranding is seen within the region of the tail of the pancreas. Spleen: Normal in size without focal abnormality. Adrenals/Urinary Tract: Adrenal glands are unremarkable. Kidneys are normal, without renal calculi, focal lesion, or hydronephrosis. Bladder is unremarkable. Stomach/Bowel: There is a small hiatal hernia. Appendix appears normal. No evidence of bowel wall thickening, distention, or  inflammatory changes. Vascular/Lymphatic: There is mild aortic atherosclerosis. No enlarged abdominal or pelvic lymph nodes. Reproductive: A 3.4 cm x 2.2 cm calcified uterine fibroid is seen along the anterolateral aspect of the uterus on the left. The bilateral adnexa are unremarkable. Other: Musculoskeletal: There is marked severity dextroscoliosis of the lumbar spine with marked severity multilevel degenerative changes. IMPRESSION: 1. Findings consistent with acute pancreatitis. Correlation with pancreatic enzymes is recommended. 2. Cholelithiasis. 3. Small hiatal hernia. 4. Calcified uterine fibroid. Electronically Signed   By: Virgina Norfolk M.D.   On: 02/21/2019 15:40   MR ABDOMEN MRCP W WO CONTAST  Result Date: 02/22/2019 CLINICAL DATA:  Pancreatitis. EXAM: MRI ABDOMEN WITHOUT AND WITH CONTRAST (INCLUDING MRCP) TECHNIQUE: Multiplanar multisequence MR imaging of  the abdomen was performed both before and after the administration of intravenous contrast. Heavily T2-weighted images of the biliary and pancreatic ducts were obtained, and three-dimensional MRCP images were rendered by post processing. CONTRAST:  6.50m GADAVIST GADOBUTROL 1 MMOL/ML IV SOLN COMPARISON:  CT 02/21/2019 FINDINGS: Lower chest: Unremarkable. Hepatobiliary: Evaluation of liver is motion degraded. Within this limitation, no gross liver lesion evident. Multiple stones are seen in the gallbladder measuring up to 2 cm diameter. Mild intrahepatic biliary duct dilatation noted with common bile duct diameter in the head of pancreas measuring upper normal at 6 mm. Axial single shot imaging shows some areas of flow artifact in the common duct, but dedicated fluid sensitive MRCP imaging shows no evidence for choledocholithiasis. Pancreas: Assessment of pancreatic parenchyma degraded by motion artifact. No gross pancreatic mass lesion evident. No substantial dilatation of the main pancreatic duct. Pancreatic and peripancreatic edema noted. No evidence of pancreas divisum. Spleen:  Grossly unremarkable. Adrenals/Urinary Tract: No adrenal nodule or mass. Kidneys unremarkable. Stomach/Bowel: Stomach is nondistended. No small bowel or colonic dilatation within the visualized abdomen. Vascular/Lymphatic: No abdominal aortic aneurysm. No abdominal lymphadenopathy. Other: Small volume free fluid. Fibroid changes noted in the uterus with prominent endometrium. Musculoskeletal: No abnormal marrow enhancement within the visualized bony anatomy IMPRESSION: 1. Cholelithiasis with mild intrahepatic biliary duct distension. Extrahepatic common duct measures upper normal for size with no definite findings of choledocholithiasis. 2. Diffuse pancreatic and peripancreatic edema consistent with pancreatitis. No pancreas divisum. No dilatation of the main pancreatic duct. 3. Small volume intraperitoneal free fluid. 4. Fibroid change  in the uterus with prominent endometrium in this postmenopausal female. Consider pelvic ultrasound to further evaluate. Electronically Signed   By: EMisty StanleyM.D.   On: 02/22/2019 08:12   Review of Systems  Constitutional: Negative.   HENT: Negative.   Eyes: Negative.   Respiratory: Negative.   Cardiovascular: Negative.   Gastrointestinal: Positive for abdominal pain and constipation. Negative for abdominal distention, anal bleeding, blood in stool, diarrhea, nausea and rectal pain.  Endocrine: Negative.   Genitourinary: Negative.   Musculoskeletal: Positive for arthralgias.  Skin: Negative.   Neurological: Negative.   Hematological: Negative.   Psychiatric/Behavioral: Negative.    Blood pressure 120/63, pulse 79, temperature 99.2 F (37.3 C), temperature source Oral, resp. rate 18, height 5' (1.524 m), weight 65.8 kg, SpO2 98 %. Physical Exam  Constitutional: She is oriented to person, place, and time. She appears well-developed and well-nourished.  HENT:  Head: Normocephalic and atraumatic.  Eyes: Pupils are equal, round, and reactive to light. Conjunctivae and EOM are normal.  Cardiovascular: Normal rate and regular rhythm.  Respiratory: Effort normal and breath sounds normal.  GI: Soft. Bowel sounds are normal. She exhibits no distension and no mass. There  is abdominal tenderness. There is no rebound and no guarding.  Musculoskeletal:        General: Normal range of motion.     Cervical back: Normal range of motion and neck supple.  Neurological: She is alert and oriented to person, place, and time.  Skin: Skin is warm and dry.  Psychiatric: She has a normal mood and affect. Her behavior is normal. Judgment and thought content normal.   Assessment/Plan: 1) Acute biliary pancreatitis with cholelithiasis on CT scan confirmed by MRCP, elevated lipase and elevated transaminases and no evidence of choledocholithiasis.  Agree with the cholecystectomy as planned by Dr. Dema Severin after  clinical and biochemical resolution of her her acute pancreatitis. 2) Mild constipation probably secondary to narcotic-she should use Colace as needed. 3) AODM/HTN/Hyperlipidemia. 4) Uterine fibroids with a prominent endometrium on CT scan will need follow-up on an outpatient basis. Juanita Craver 02/22/2019, 1:37 PM

## 2019-02-23 LAB — BASIC METABOLIC PANEL
Anion gap: 12 (ref 5–15)
BUN: <5 mg/dL — ABNORMAL LOW (ref 8–23)
CO2: 25 mmol/L (ref 22–32)
Calcium: 8.8 mg/dL — ABNORMAL LOW (ref 8.9–10.3)
Chloride: 98 mmol/L (ref 98–111)
Creatinine, Ser: 0.95 mg/dL (ref 0.44–1.00)
GFR calc Af Amer: >60 mL/min (ref >60)
GFR calc non Af Amer: 58 mL/min — ABNORMAL LOW (ref >60)
Glucose, Bld: 147 mg/dL — ABNORMAL HIGH (ref 70–99)
Potassium: 3.2 mmol/L — ABNORMAL LOW (ref 3.5–5.1)
Sodium: 135 mmol/L (ref 135–145)

## 2019-02-23 LAB — COMPREHENSIVE METABOLIC PANEL
ALT: 333 U/L — ABNORMAL HIGH (ref 0–44)
AST: 198 U/L — ABNORMAL HIGH (ref 15–41)
Albumin: 3 g/dL — ABNORMAL LOW (ref 3.5–5.0)
Alkaline Phosphatase: 130 U/L — ABNORMAL HIGH (ref 38–126)
Anion gap: 7 (ref 5–15)
BUN: 7 mg/dL — ABNORMAL LOW (ref 8–23)
CO2: 28 mmol/L (ref 22–32)
Calcium: 8.4 mg/dL — ABNORMAL LOW (ref 8.9–10.3)
Chloride: 101 mmol/L (ref 98–111)
Creatinine, Ser: 0.98 mg/dL (ref 0.44–1.00)
GFR calc Af Amer: >60 mL/min (ref >60)
GFR calc non Af Amer: 56 mL/min — ABNORMAL LOW (ref >60)
Glucose, Bld: 126 mg/dL — ABNORMAL HIGH (ref 70–99)
Potassium: 2.7 mmol/L — CL (ref 3.5–5.1)
Sodium: 136 mmol/L (ref 135–145)
Total Bilirubin: 1 mg/dL (ref 0.3–1.2)
Total Protein: 6.1 g/dL — ABNORMAL LOW (ref 6.5–8.1)

## 2019-02-23 LAB — CBC
HCT: 34.7 % — ABNORMAL LOW (ref 36.0–46.0)
Hemoglobin: 11.7 g/dL — ABNORMAL LOW (ref 12.0–15.0)
MCH: 27.5 pg (ref 26.0–34.0)
MCHC: 33.7 g/dL (ref 30.0–36.0)
MCV: 81.6 fL (ref 80.0–100.0)
Platelets: 230 10*3/uL (ref 150–400)
RBC: 4.25 MIL/uL (ref 3.87–5.11)
RDW: 14.4 % (ref 11.5–15.5)
WBC: 15.6 10*3/uL — ABNORMAL HIGH (ref 4.0–10.5)
nRBC: 0 % (ref 0.0–0.2)

## 2019-02-23 LAB — GLUCOSE, CAPILLARY
Glucose-Capillary: 118 mg/dL — ABNORMAL HIGH (ref 70–99)
Glucose-Capillary: 142 mg/dL — ABNORMAL HIGH (ref 70–99)
Glucose-Capillary: 143 mg/dL — ABNORMAL HIGH (ref 70–99)
Glucose-Capillary: 118 mg/dL — ABNORMAL HIGH (ref 70–99)

## 2019-02-23 LAB — LIPASE, BLOOD: Lipase: 129 U/L — ABNORMAL HIGH (ref 11–51)

## 2019-02-23 LAB — MAGNESIUM: Magnesium: 1.6 mg/dL — ABNORMAL LOW (ref 1.7–2.4)

## 2019-02-23 LAB — PHOSPHORUS: Phosphorus: 1 mg/dL — CL (ref 2.5–4.6)

## 2019-02-23 MED ORDER — POTASSIUM PHOSPHATES 15 MMOLE/5ML IV SOLN
30.0000 mmol | Freq: Once | INTRAVENOUS | Status: AC
  Administered 2019-02-23: 30 mmol via INTRAVENOUS
  Filled 2019-02-23: qty 10

## 2019-02-23 MED ORDER — MAGNESIUM SULFATE 2 GM/50ML IV SOLN
2.0000 g | Freq: Once | INTRAVENOUS | Status: AC
  Administered 2019-02-23: 06:00:00 2 g via INTRAVENOUS
  Filled 2019-02-23: qty 50

## 2019-02-23 MED ORDER — POTASSIUM CHLORIDE 10 MEQ/100ML IV SOLN
10.0000 meq | INTRAVENOUS | Status: AC
  Administered 2019-02-23 – 2019-02-24 (×4): 10 meq via INTRAVENOUS
  Filled 2019-02-23 (×3): qty 100

## 2019-02-23 MED ORDER — MAGNESIUM SULFATE 2 GM/50ML IV SOLN
2.0000 g | Freq: Once | INTRAVENOUS | Status: AC
  Administered 2019-02-23: 10:00:00 2 g via INTRAVENOUS
  Filled 2019-02-23: qty 50

## 2019-02-23 MED ORDER — SODIUM CHLORIDE 0.9 % IV SOLN: INTRAVENOUS | Status: AC

## 2019-02-23 MED ORDER — POTASSIUM CHLORIDE 10 MEQ/100ML IV SOLN
10.0000 meq | INTRAVENOUS | Status: AC
  Administered 2019-02-23 (×4): 10 meq via INTRAVENOUS
  Filled 2019-02-23 (×4): qty 100

## 2019-02-23 NOTE — Progress Notes (Signed)
[facility] Progress Note     Subjective: CC-  Still having some upper abdominal pain, but feels like bloating is less. Denies n/v. Passing flatus, no BM. Ambulating around room without issues. Lipase and LFTs trending down  Objective: Vital signs in last 24 hours: Temp:  [98.2 F (36.8 C)-99.5 F (37.5 C)] 99.5 F (37.5 C) (01/07 0452) Pulse Rate:  [72-83] 74 (01/07 0452) Resp:  [18-19] 18 (01/07 0452) BP: (114-137)/(61-65) 114/65 (01/07 0452) SpO2:  [97 %-98 %] 98 % (01/07 0452) Last BM Date: 02/21/19  Intake/Output from previous day: 01/06 0701 - 01/07 0700 In: 2450.3 [I.V.:2273.8; IV Piggyback:176.4] Out: -  Intake/Output this shift: No intake/output data recorded.  PE: Gen:  Alert, NAD, pleasant HEENT: EOM's intact, pupils equal and round Card:  RRR Pulm:  CTAB, no W/R/R, rate and effort normal Abd: Soft, mild distension, +BS, no HSM, mild upper/ mostly epigastric TTP without peritonitis Psych: A&Ox3  Skin: no rashes noted, warm and dry  Lab Results:  Recent Labs    02/22/19 0304 02/23/19 0144  WBC 19.8* 15.6*  HGB 12.9 11.7*  HCT 36.8 34.7*  PLT 267 230   BMET Recent Labs    02/22/19 0304 02/23/19 0144  NA 134* 136  K 3.9 2.7*  CL 97* 101  CO2 28 28  GLUCOSE 136* 126*  BUN 15 7*  CREATININE 1.46* 0.98  CALCIUM 8.9 8.4*   PT/INR No results for input(s): LABPROT, INR in the last 72 hours. CMP     Component Value Date/Time   NA 136 02/23/2019 0144   K 2.7 (LL) 02/23/2019 0144   K 3.5 01/09/2014 1519   CL 101 02/23/2019 0144   CO2 28 02/23/2019 0144   GLUCOSE 126 (H) 02/23/2019 0144   BUN 7 (L) 02/23/2019 0144   CREATININE 0.98 02/23/2019 0144   CALCIUM 8.4 (L) 02/23/2019 0144   PROT 6.1 (L) 02/23/2019 0144   ALBUMIN 3.0 (L) 02/23/2019 0144   AST 198 (H) 02/23/2019 0144   ALT 333 (H) 02/23/2019 0144   ALKPHOS 130 (H) 02/23/2019 0144   BILITOT 1.0 02/23/2019 0144   GFRNONAA 56 (L) 02/23/2019 0144   GFRAA >60 02/23/2019  0144   Lipase     Component Value Date/Time   LIPASE 129 (H) 02/23/2019 0144       Studies/Results: CT Abdomen Pelvis Wo Contrast  Result Date: 02/21/2019 CLINICAL DATA:  Nausea and vomiting. EXAM: CT ABDOMEN AND PELVIS WITHOUT CONTRAST TECHNIQUE: Multidetector CT imaging of the abdomen and pelvis was performed following the standard protocol without IV contrast. COMPARISON:  None. FINDINGS: Lower chest: No acute abnormality. Hepatobiliary: No focal liver abnormality is seen. Numerous subcentimeter gallstones are seen within the gallbladder without evidence of gallbladder wall thickening or biliary dilatation. Pancreas: Mild peripancreatic inflammatory fat stranding is seen within the region of the tail of the pancreas. Spleen: Normal in size without focal abnormality. Adrenals/Urinary Tract: Adrenal glands are unremarkable. Kidneys are normal, without renal calculi, focal lesion, or hydronephrosis. Bladder is unremarkable. Stomach/Bowel: There is a small hiatal hernia. Appendix appears normal. No evidence of bowel wall thickening, distention, or inflammatory changes. Vascular/Lymphatic: There is mild aortic atherosclerosis. No enlarged abdominal or pelvic lymph nodes. Reproductive: A 3.4 cm x 2.2 cm calcified uterine fibroid is seen along the anterolateral aspect of the uterus on the left. The bilateral adnexa are unremarkable. Other: Musculoskeletal: There is marked severity dextroscoliosis of the lumbar spine with marked severity multilevel degenerative changes. IMPRESSION: 1. Findings consistent with acute  pancreatitis. Correlation with pancreatic enzymes is recommended. 2. Cholelithiasis. 3. Small hiatal hernia. 4. Calcified uterine fibroid. Electronically Signed   By: Virgina Norfolk M.D.   On: 02/21/2019 15:40   MR ABDOMEN MRCP W WO CONTAST  Result Date: 02/22/2019 CLINICAL DATA:  Pancreatitis. EXAM: MRI ABDOMEN WITHOUT AND WITH CONTRAST (INCLUDING MRCP) TECHNIQUE: Multiplanar multisequence  MR imaging of the abdomen was performed both before and after the administration of intravenous contrast. Heavily T2-weighted images of the biliary and pancreatic ducts were obtained, and three-dimensional MRCP images were rendered by post processing. CONTRAST:  6.54mL GADAVIST GADOBUTROL 1 MMOL/ML IV SOLN COMPARISON:  CT 02/21/2019 FINDINGS: Lower chest: Unremarkable. Hepatobiliary: Evaluation of liver is motion degraded. Within this limitation, no gross liver lesion evident. Multiple stones are seen in the gallbladder measuring up to 2 cm diameter. Mild intrahepatic biliary duct dilatation noted with common bile duct diameter in the head of pancreas measuring upper normal at 6 mm. Axial single shot imaging shows some areas of flow artifact in the common duct, but dedicated fluid sensitive MRCP imaging shows no evidence for choledocholithiasis. Pancreas: Assessment of pancreatic parenchyma degraded by motion artifact. No gross pancreatic mass lesion evident. No substantial dilatation of the main pancreatic duct. Pancreatic and peripancreatic edema noted. No evidence of pancreas divisum. Spleen:  Grossly unremarkable. Adrenals/Urinary Tract: No adrenal nodule or mass. Kidneys unremarkable. Stomach/Bowel: Stomach is nondistended. No small bowel or colonic dilatation within the visualized abdomen. Vascular/Lymphatic: No abdominal aortic aneurysm. No abdominal lymphadenopathy. Other: Small volume free fluid. Fibroid changes noted in the uterus with prominent endometrium. Musculoskeletal: No abnormal marrow enhancement within the visualized bony anatomy IMPRESSION: 1. Cholelithiasis with mild intrahepatic biliary duct distension. Extrahepatic common duct measures upper normal for size with no definite findings of choledocholithiasis. 2. Diffuse pancreatic and peripancreatic edema consistent with pancreatitis. No pancreas divisum. No dilatation of the main pancreatic duct. 3. Small volume intraperitoneal free fluid. 4.  Fibroid change in the uterus with prominent endometrium in this postmenopausal female. Consider pelvic ultrasound to further evaluate. Electronically Signed   By: Misty Stanley M.D.   On: 02/22/2019 08:12    Anti-infectives: Anti-infectives (From admission, onward)   Start     Dose/Rate Route Frequency Ordered Stop   02/21/19 1900  metroNIDAZOLE (FLAGYL) tablet 500 mg     500 mg Oral Every 8 hours 02/21/19 1716     02/21/19 1800  cefTRIAXone (ROCEPHIN) 1 g in sodium chloride 0.9 % 100 mL IVPB     1 g 200 mL/hr over 30 Minutes Intravenous Every 24 hours 02/21/19 1716         Assessment/Plan HTN HLD DM  Fibroid uterus - per primary, may need further workup   Gallstone Pancreatitis  Elevated LFT's - MRCP 1/6 w/out evidence of CBD stone.  - No indication for abx from a surgical standpoint. There is no evidence of cholecystitis on imaging - Lipase and LFTs down trending but patient still tender on exam. Ok for clear liquids again today, NPO after midnight. Repeat labs in AM. Once labs and abdominal pain improve will plan for laparoscopic cholecystectomy.  ID - Rocpehin/Flagyl 1/5 >> VTE - SCDs, Subq Heparin FEN - IVF, CLD, NPO after MN   LOS: 2 days    Wellington Hampshire, [facility] 02/23/2019, 8:09 AM Please see Amion for pager number during day hours 7:00am-4:30pm

## 2019-02-23 NOTE — Plan of Care (Signed)
  Problem: Health Behavior/Discharge Planning: Goal: Ability to manage health-related needs will improve Outcome: Progressing   

## 2019-02-23 NOTE — Progress Notes (Addendum)
PROGRESS NOTE  [name] [id number] DOB: [birth year] DOA: 02/20/2019 PCP: Abner Greenspan, MD   LOS: 2 days   Brief narrative: As per HPI,  [name] is a 79 y.o. female with medical history significant of diet-controlled diabetes, hypertension, hyperlipidemia presented with abdominal pain, nausea, vomiting.  Symptoms started 2 days ago on the epigastric area. Initially she thought it is her GERD.  She tried Tylenol did not help. ED Course: CT showed acute pancreatitis and gallstone, with normal lipase, WBC count elevated.  Patient was then admitted to hospital for acute gallstone pancreatitis.  Assessment/Plan:  Active Problems:   Pancreatitis, acute   Acute pancreatitis  Acute gallstone pancreatitis,status post MRCP.  MRCP shows a gallstone with intrahepatic biliary dilatation but no choledocholithiasis.  On clears.   Continue to hold statins for now.  Patient is on Rocephin and metronidazole.  Seen by surgery.  Likely cholecystectomy  tomorrow after clinical improvement of pancreatitis. Lipase of 129 from 782   Significant hypokalemia.  Hypophosphatemia.  Hypomagnesemia.  Will replenish iv.  Check electrolytes closely.  We will give IV magnesium sulfate 4 g, potassium phosphate 30 mEq and IV potassium.  Repeat BMP in a.m.  Elevated LFT slightly trended down today.  Likely secondary to gallstone pancreatitis.  Leukocytosis,  mild fever.  Mild elevated bilirubin at 1.0 with trending down LFts.  Likely passed gal stone.  Empirically on antibiotic for now,  Essential hypertension,  as needed hydralazine for now.  Hold lisinopril and HCTZ as outpatient.  Mild acute kidney injury likely secondary to volume depletion.    Creatinine of 0.9 today  Diabetes mellitus, diet controlled, hemoglobin A1c however at 6.6.   Might need to be started on oral medication as per primary after discharge.  VTE Prophylaxis: Heparin subcu  Code Status: Full code  Family Communication: Spoke  with the patient in detail. I also spoke with the patient's daughter [name] on the phone and updated her about the clinical condition of the patient. Patient's daughter wishes surgeon to discuss the plan with her.  Disposition Plan: Home, await cholecystectomy.   Consultants:  GI  General surgery  Procedures:  MRCP  Antibiotics:  Anti-infectives (From admission, onward)   Start     Dose/Rate Route Frequency Ordered Stop   02/21/19 1900  metroNIDAZOLE (FLAGYL) tablet 500 mg     500 mg Oral Every 8 hours 02/21/19 1716     02/21/19 1800  cefTRIAXone (ROCEPHIN) 1 g in sodium chloride 0.9 % 100 mL IVPB     1 g 200 mL/hr over 30 Minutes Intravenous Every 24 hours 02/21/19 1716        Subjective: Patient feels better today, has mild pain. No nausea, vomiting or fever.   Objective: Vitals:   02/22/19 2115 02/23/19 0452  BP: 137/64 114/65  Pulse: 83 74  Resp: 19 18  Temp: 98.8 F (37.1 C) 99.5 F (37.5 C)  SpO2: 97% 98%    Intake/Output Summary (Last 24 hours) at 02/23/2019 0818 Last data filed at 02/23/2019 0648 Gross per 24 hour  Intake 2450.25 ml  Output --  Net 2450.25 ml   Filed Weights   02/20/19 2222  Weight: 65.8 kg   Body mass index is 28.32 kg/m.   Physical Exam:  GENERAL: Patient is alert awake and communicative. Not in obvious distress. HENT: No scleral pallor or icterus. Pupils equally reactive to light. Oral mucosa is moist NECK: is supple, no palpable thyroid enlargement. CHEST: Clear to auscultation. No  crackles or wheezes. Non tender on palpation. Diminished breath sounds bilaterally. CVS: S1 and S2 heard, no murmur. Regular rate and rhythm. No pericardial rub. ABDOMEN: Soft, mild epigastric tenderness on deep palpation, bowel sounds are present. EXTREMITIES: No edema. CNS: Cranial nerves are intact. No focal motor or sensory deficits. SKIN: warm and dry without rashes.  Data Review: I have personally reviewed the following laboratory  data and studies,  CBC: Recent Labs  Lab 02/20/19 2243 02/22/19 0304 02/23/19 0144  WBC 16.7* 19.8* 15.6*  HGB 13.9 12.9 11.7*  HCT 40.1 36.8 34.7*  MCV 82.3 82.0 81.6  PLT 333 267 [id number]   Basic Metabolic Panel: Recent Labs  Lab 02/20/19 2243 02/22/19 0304 02/23/19 0144  NA 129* 134* 136  K 4.0 3.9 2.7*  CL 89* 97* 101  CO2 28 28 28   GLUCOSE 90 136* 126*  BUN 45* 15 7*  CREATININE 1.62* 1.46* 0.98  CALCIUM 9.3 8.9 8.4*  MG  --   --  1.6*  PHOS  --   --  1.0*   Liver Function Tests: Recent Labs  Lab 02/20/19 2243 02/22/19 0304 02/23/19 0144  AST 67* 497* 198*  ALT 31 606* 333*  ALKPHOS 95 176* 130*  BILITOT 3.7* 1.4* 1.0  PROT 7.9 6.6 6.1*  ALBUMIN 3.3* 3.3* 3.0*   Recent Labs  Lab 02/20/19 2243 02/22/19 0304 02/23/19 0144  LIPASE 27 782* 129*   No results for input(s): AMMONIA in the last 168 hours. Cardiac Enzymes: No results for input(s): CKTOTAL, CKMB, CKMBINDEX, TROPONINI in the last 168 hours. BNP (last 3 results) No results for input(s): BNP in the last 8760 hours.  ProBNP (last 3 results) No results for input(s): PROBNP in the last 8760 hours.  CBG: Recent Labs  Lab 02/22/19 0732 02/22/19 1133 02/22/19 1624 02/22/19 2032 02/23/19 0812  GLUCAP 118* 189* 127* 104* 118*   Recent Results (from the past 240 hour(s))  Respiratory Panel by RT PCR (Flu A&B, Covid) - Nasopharyngeal Swab     Status: None   Collection Time: 02/21/19 11:45 PM   Specimen: Nasopharyngeal Swab  Result Value Ref Range Status   SARS Coronavirus 2 by RT PCR NEGATIVE NEGATIVE Final    Comment: (NOTE) SARS-CoV-2 target nucleic acids are NOT DETECTED. The SARS-CoV-2 RNA is generally detectable in upper respiratoy specimens during the acute phase of infection. The lowest concentration of SARS-CoV-2 viral copies this assay can detect is 131 copies/mL. A negative result does not preclude SARS-Cov-2 infection and should not be used as the sole basis for treatment  or other patient management decisions. A negative result may occur with  improper specimen collection/handling, submission of specimen other than nasopharyngeal swab, presence of viral mutation(s) within the areas targeted by this assay, and inadequate number of viral copies (<131 copies/mL). A negative result must be combined with clinical observations, patient history, and epidemiological information. The expected result is Negative. Fact Sheet for Patients:  PinkCheek.be Fact Sheet for Healthcare Providers:  GravelBags.it This test is not yet ap proved or cleared by the [place] FDA and  has been authorized for detection and/or diagnosis of SARS-CoV-2 by FDA under an Emergency Use Authorization (EUA). This EUA will remain  in effect (meaning this test can be used) for the duration of the COVID-19 declaration under Section 564(b)(1) of the Act, 21 U.S.C. section 360bbb-3(b)(1), unless the authorization is terminated or revoked sooner.    Influenza A by PCR NEGATIVE NEGATIVE Final   Influenza B by PCR  NEGATIVE NEGATIVE Final    Comment: (NOTE) The Xpert Xpress SARS-CoV-2/FLU/RSV assay is intended as an aid in  the diagnosis of influenza from Nasopharyngeal swab specimens and  should not be used as a sole basis for treatment. Nasal washings and  aspirates are unacceptable for Xpert Xpress SARS-CoV-2/FLU/RSV  testing. Fact Sheet for Patients: PinkCheek.be Fact Sheet for Healthcare Providers: GravelBags.it This test is not yet approved or cleared by the [place] FDA and  has been authorized for detection and/or diagnosis of SARS-CoV-2 by  FDA under an Emergency Use Authorization (EUA). This EUA will remain  in effect (meaning this test can be used) for the duration of the  Covid-19 declaration under Section 564(b)(1) of the Act, 21  U.S.C. section  360bbb-3(b)(1), unless the authorization is  terminated or revoked. Performed at [facility], [place] [address], [place], [place] [address]      Studies: CT Abdomen Pelvis Wo Contrast  Result Date: 02/21/2019 CLINICAL DATA:  Nausea and vomiting. EXAM: CT ABDOMEN AND PELVIS WITHOUT CONTRAST TECHNIQUE: Multidetector CT imaging of the abdomen and pelvis was performed following the standard protocol without IV contrast. COMPARISON:  None. FINDINGS: Lower chest: No acute abnormality. Hepatobiliary: No focal liver abnormality is seen. Numerous subcentimeter gallstones are seen within the gallbladder without evidence of gallbladder wall thickening or biliary dilatation. Pancreas: Mild peripancreatic inflammatory fat stranding is seen within the region of the tail of the pancreas. Spleen: Normal in size without focal abnormality. Adrenals/Urinary Tract: Adrenal glands are unremarkable. Kidneys are normal, without renal calculi, focal lesion, or hydronephrosis. Bladder is unremarkable. Stomach/Bowel: There is a small hiatal hernia. Appendix appears normal. No evidence of bowel wall thickening, distention, or inflammatory changes. Vascular/Lymphatic: There is mild aortic atherosclerosis. No enlarged abdominal or pelvic lymph nodes. Reproductive: A 3.4 cm x 2.2 cm calcified uterine fibroid is seen along the anterolateral aspect of the uterus on the left. The bilateral adnexa are unremarkable. Other: Musculoskeletal: There is marked severity dextroscoliosis of the lumbar spine with marked severity multilevel degenerative changes. IMPRESSION: 1. Findings consistent with acute pancreatitis. Correlation with pancreatic enzymes is recommended. 2. Cholelithiasis. 3. Small hiatal hernia. 4. Calcified uterine fibroid. Electronically Signed   By: Virgina Norfolk M.D.   On: 02/21/2019 15:40   MR ABDOMEN MRCP W WO CONTAST  Result Date: 02/22/2019 CLINICAL DATA:  Pancreatitis. EXAM: MRI ABDOMEN WITHOUT AND WITH CONTRAST  (INCLUDING MRCP) TECHNIQUE: Multiplanar multisequence MR imaging of the abdomen was performed both before and after the administration of intravenous contrast. Heavily T2-weighted images of the biliary and pancreatic ducts were obtained, and three-dimensional MRCP images were rendered by post processing. CONTRAST:  6.5mL GADAVIST GADOBUTROL 1 MMOL/ML IV SOLN COMPARISON:  CT 02/21/2019 FINDINGS: Lower chest: Unremarkable. Hepatobiliary: Evaluation of liver is motion degraded. Within this limitation, no gross liver lesion evident. Multiple stones are seen in the gallbladder measuring up to 2 cm diameter. Mild intrahepatic biliary duct dilatation noted with common bile duct diameter in the head of pancreas measuring upper normal at 6 mm. Axial single shot imaging shows some areas of flow artifact in the common duct, but dedicated fluid sensitive MRCP imaging shows no evidence for choledocholithiasis. Pancreas: Assessment of pancreatic parenchyma degraded by motion artifact. No gross pancreatic mass lesion evident. No substantial dilatation of the main pancreatic duct. Pancreatic and peripancreatic edema noted. No evidence of pancreas divisum. Spleen:  Grossly unremarkable. Adrenals/Urinary Tract: No adrenal nodule or mass. Kidneys unremarkable. Stomach/Bowel: Stomach is nondistended. No small bowel or colonic dilatation  within the visualized abdomen. Vascular/Lymphatic: No abdominal aortic aneurysm. No abdominal lymphadenopathy. Other: Small volume free fluid. Fibroid changes noted in the uterus with prominent endometrium. Musculoskeletal: No abnormal marrow enhancement within the visualized bony anatomy IMPRESSION: 1. Cholelithiasis with mild intrahepatic biliary duct distension. Extrahepatic common duct measures upper normal for size with no definite findings of choledocholithiasis. 2. Diffuse pancreatic and peripancreatic edema consistent with pancreatitis. No pancreas divisum. No dilatation of the main pancreatic  duct. 3. Small volume intraperitoneal free fluid. 4. Fibroid change in the uterus with prominent endometrium in this postmenopausal female. Consider pelvic ultrasound to further evaluate. Electronically Signed   By: Misty Stanley M.D.   On: 02/22/2019 08:12    Scheduled Meds: . aspirin EC  81 mg Oral Daily  . cholecalciferol  1,000 Units Oral Daily  . heparin  5,000 Units Subcutaneous Q8H  . insulin aspart  0-9 Units Subcutaneous TID WC  . lactobacillus acidophilus  2 tablet Oral TID  . metroNIDAZOLE  500 mg Oral Q8H  . multivitamin with minerals  1 tablet Oral Daily  . sodium chloride flush  3 mL Intravenous Once    Continuous Infusions: . sodium chloride 75 mL/hr at 02/22/19 2042  . cefTRIAXone (ROCEPHIN)  IV 1 g (02/22/19 1705)  . potassium chloride 10 mEq (02/23/19 0631)  . potassium PHOSPHATE IVPB (in mmol)       Flora Lipps, MD  [facility] 02/23/2019

## 2019-02-24 ENCOUNTER — Encounter (HOSPITAL_COMMUNITY)
Admission: EM | Disposition: A | Discharge status: Home / Self Care | Payer: Self-pay | Source: Home / Self Care | Attending: Internal Medicine

## 2019-02-24 ENCOUNTER — Inpatient Hospital Stay (HOSPITAL_COMMUNITY): Payer: Medicare Other | Admitting: Anesthesiology

## 2019-02-24 ENCOUNTER — Encounter (HOSPITAL_COMMUNITY): Payer: Self-pay | Admitting: Internal Medicine

## 2019-02-24 DIAGNOSIS — E876 Hypokalemia: Secondary | ICD-10-CM

## 2019-02-24 HISTORY — PX: CHOLECYSTECTOMY: SHX55

## 2019-02-24 LAB — CBC
HCT: 34.7 % — ABNORMAL LOW (ref 36.0–46.0)
Hemoglobin: 11.9 g/dL — ABNORMAL LOW (ref 12.0–15.0)
MCH: 27.7 pg (ref 26.0–34.0)
MCHC: 34.3 g/dL (ref 30.0–36.0)
MCV: 80.7 fL (ref 80.0–100.0)
Platelets: 253 10*3/uL (ref 150–400)
RBC: 4.3 MIL/uL (ref 3.87–5.11)
RDW: 14.3 % (ref 11.5–15.5)
WBC: 13.3 10*3/uL — ABNORMAL HIGH (ref 4.0–10.5)
nRBC: 0 % (ref 0.0–0.2)

## 2019-02-24 LAB — COMPREHENSIVE METABOLIC PANEL
ALT: 218 U/L — ABNORMAL HIGH (ref 0–44)
AST: 85 U/L — ABNORMAL HIGH (ref 15–41)
Albumin: 2.9 g/dL — ABNORMAL LOW (ref 3.5–5.0)
Alkaline Phosphatase: 117 U/L (ref 38–126)
Anion gap: 11 (ref 5–15)
BUN: <5 mg/dL — ABNORMAL LOW (ref 8–23)
CO2: 24 mmol/L (ref 22–32)
Calcium: 8.5 mg/dL — ABNORMAL LOW (ref 8.9–10.3)
Chloride: 103 mmol/L (ref 98–111)
Creatinine, Ser: 0.84 mg/dL (ref 0.44–1.00)
GFR calc Af Amer: >60 mL/min (ref >60)
GFR calc non Af Amer: >60 mL/min (ref >60)
Glucose, Bld: 118 mg/dL — ABNORMAL HIGH (ref 70–99)
Potassium: 3.3 mmol/L — ABNORMAL LOW (ref 3.5–5.1)
Sodium: 138 mmol/L (ref 135–145)
Total Bilirubin: 0.8 mg/dL (ref 0.3–1.2)
Total Protein: 6.1 g/dL — ABNORMAL LOW (ref 6.5–8.1)

## 2019-02-24 LAB — GLUCOSE, CAPILLARY
Glucose-Capillary: 99 mg/dL (ref 70–99)
Glucose-Capillary: 102 mg/dL — ABNORMAL HIGH (ref 70–99)
Glucose-Capillary: 104 mg/dL — ABNORMAL HIGH (ref 70–99)
Glucose-Capillary: 112 mg/dL — ABNORMAL HIGH (ref 70–99)

## 2019-02-24 LAB — LIPASE, BLOOD: Lipase: 44 U/L (ref 11–51)

## 2019-02-24 LAB — MRSA PCR SCREENING: MRSA by PCR: NEGATIVE

## 2019-02-24 SURGERY — LAPAROSCOPIC CHOLECYSTECTOMY
Anesthesia: General | Site: Abdomen

## 2019-02-24 MED ORDER — PHENYLEPHRINE HCL-NACL 10-0.9 MG/250ML-% IV SOLN
INTRAVENOUS | Status: DC | PRN
  Administered 2019-02-24: 40 ug/min via INTRAVENOUS

## 2019-02-24 MED ORDER — POTASSIUM CHLORIDE 10 MEQ/100ML IV SOLN
10.0000 meq | INTRAVENOUS | Status: AC
  Administered 2019-02-24 (×2): 10 meq via INTRAVENOUS
  Filled 2019-02-24 (×2): qty 100

## 2019-02-24 MED ORDER — SUGAMMADEX SODIUM 200 MG/2ML IV SOLN
INTRAVENOUS | Status: DC | PRN
  Administered 2019-02-24: 150 mg via INTRAVENOUS

## 2019-02-24 MED ORDER — LIDOCAINE 2% (20 MG/ML) 5 ML SYRINGE
INTRAMUSCULAR | Status: DC | PRN
  Administered 2019-02-24: 60 mg via INTRAVENOUS

## 2019-02-24 MED ORDER — PROPOFOL 500 MG/50ML IV EMUL
INTRAVENOUS | Status: DC | PRN
  Administered 2019-02-24: 25 ug/kg/min via INTRAVENOUS

## 2019-02-24 MED ORDER — OXYCODONE HCL 5 MG/5ML PO SOLN: 5.0000 mg | Freq: Once | ORAL | Status: DC | PRN

## 2019-02-24 MED ORDER — FENTANYL CITRATE (PF) 100 MCG/2ML IJ SOLN
INTRAMUSCULAR | Status: DC | PRN
  Administered 2019-02-24: 25 ug via INTRAVENOUS
  Administered 2019-02-24: 75 ug via INTRAVENOUS

## 2019-02-24 MED ORDER — FENTANYL CITRATE (PF) 250 MCG/5ML IJ SOLN
INTRAMUSCULAR | Status: AC
  Filled 2019-02-24: qty 5

## 2019-02-24 MED ORDER — ROCURONIUM BROMIDE 10 MG/ML (PF) SYRINGE
PREFILLED_SYRINGE | INTRAVENOUS | Status: AC
  Filled 2019-02-24: qty 30

## 2019-02-24 MED ORDER — POTASSIUM CHLORIDE 10 MEQ/100ML IV SOLN
INTRAVENOUS | Status: AC
  Filled 2019-02-24: qty 100

## 2019-02-24 MED ORDER — BUPIVACAINE HCL (PF) 0.25 % IJ SOLN
INTRAMUSCULAR | Status: AC
  Filled 2019-02-24: qty 30

## 2019-02-24 MED ORDER — BUPIVACAINE HCL 0.25 % IJ SOLN
INTRAMUSCULAR | Status: DC | PRN
  Administered 2019-02-24: 12 mL

## 2019-02-24 MED ORDER — LACTATED RINGERS IV SOLN: INTRAVENOUS | Status: DC | PRN

## 2019-02-24 MED ORDER — ONDANSETRON HCL 4 MG/2ML IJ SOLN
INTRAMUSCULAR | Status: AC
  Filled 2019-02-24: qty 4

## 2019-02-24 MED ORDER — OXYCODONE HCL 5 MG PO TABS: 5.0000 mg | ORAL_TABLET | Freq: Once | ORAL | Status: DC | PRN

## 2019-02-24 MED ORDER — ONDANSETRON HCL 4 MG/2ML IJ SOLN
INTRAMUSCULAR | Status: DC | PRN
  Administered 2019-02-24: 4 mg via INTRAVENOUS

## 2019-02-24 MED ORDER — FENTANYL CITRATE (PF) 100 MCG/2ML IJ SOLN: 25.0000 ug | INTRAMUSCULAR | Status: DC | PRN

## 2019-02-24 MED ORDER — PROPOFOL 10 MG/ML IV BOLUS
INTRAVENOUS | Status: DC | PRN
  Administered 2019-02-24: 120 mg via INTRAVENOUS

## 2019-02-24 MED ORDER — EPHEDRINE 5 MG/ML INJ
INTRAVENOUS | Status: AC
  Filled 2019-02-24: qty 20

## 2019-02-24 MED ORDER — LIDOCAINE 2% (20 MG/ML) 5 ML SYRINGE
INTRAMUSCULAR | Status: AC
  Filled 2019-02-24: qty 10

## 2019-02-24 MED ORDER — ONDANSETRON HCL 4 MG/2ML IJ SOLN: 4.0000 mg | Freq: Once | INTRAMUSCULAR | Status: DC | PRN

## 2019-02-24 MED ORDER — SODIUM CHLORIDE (PF) 0.9 % IJ SOLN
INTRAMUSCULAR | Status: AC
  Filled 2019-02-24: qty 10

## 2019-02-24 MED ORDER — SODIUM CHLORIDE 0.9 % IR SOLN
Status: DC | PRN
  Administered 2019-02-24: 1000 mL

## 2019-02-24 MED ORDER — 0.9 % SODIUM CHLORIDE (POUR BTL) OPTIME
TOPICAL | Status: DC | PRN
  Administered 2019-02-24: 1000 mL

## 2019-02-24 MED ORDER — POTASSIUM CHLORIDE 10 MEQ/100ML IV SOLN
10.0000 meq | INTRAVENOUS | Status: AC
  Administered 2019-02-24 (×2): 10 meq via INTRAVENOUS
  Filled 2019-02-24 (×2): qty 100

## 2019-02-24 MED ORDER — DEXAMETHASONE SODIUM PHOSPHATE 10 MG/ML IJ SOLN
INTRAMUSCULAR | Status: AC
  Filled 2019-02-24: qty 2

## 2019-02-24 MED ORDER — ROCURONIUM BROMIDE 10 MG/ML (PF) SYRINGE
PREFILLED_SYRINGE | INTRAVENOUS | Status: DC | PRN
  Administered 2019-02-24: 50 mg via INTRAVENOUS

## 2019-02-24 SURGICAL SUPPLY — 41 items
ADH SKN CLS APL DERMABOND .7 (GAUZE/BANDAGES/DRESSINGS) ×1
APL PRP STRL LF DISP 70% ISPRP (MISCELLANEOUS) ×1
APPLIER CLIP 5 13 M/L LIGAMAX5 (MISCELLANEOUS) ×3
APR CLP MED LRG 5 ANG JAW (MISCELLANEOUS) ×1
BAG SPEC RTRVL LRG 6X4 10 (ENDOMECHANICALS)
BLADE CLIPPER SURG (BLADE) IMPLANT
CANISTER SUCT 3000ML PPV (MISCELLANEOUS) ×3 IMPLANT
CHLORAPREP W/TINT 26 (MISCELLANEOUS) ×3 IMPLANT
CLIP APPLIE 5 13 M/L LIGAMAX5 (MISCELLANEOUS) ×1 IMPLANT
COVER SURGICAL LIGHT HANDLE (MISCELLANEOUS) ×3 IMPLANT
COVER WAND RF STERILE (DRAPES) ×3 IMPLANT
DERMABOND ADVANCED (GAUZE/BANDAGES/DRESSINGS) ×2
DERMABOND ADVANCED .7 DNX12 (GAUZE/BANDAGES/DRESSINGS) ×1 IMPLANT
DISSECTOR BLUNT TIP ENDO 5MM (MISCELLANEOUS) IMPLANT
ELECT CAUTERY BLADE 6.4 (BLADE) ×3 IMPLANT
ELECT REM PT RETURN 9FT ADLT (ELECTROSURGICAL) ×3
ELECTRODE REM PT RTRN 9FT ADLT (ELECTROSURGICAL) ×1 IMPLANT
GLOVE BIO SURGEON STRL SZ7.5 (GLOVE) ×3 IMPLANT
GLOVE INDICATOR 8.0 STRL GRN (GLOVE) ×3 IMPLANT
GOWN STRL REUS W/ TWL LRG LVL3 (GOWN DISPOSABLE) ×2 IMPLANT
GOWN STRL REUS W/ TWL XL LVL3 (GOWN DISPOSABLE) ×1 IMPLANT
GOWN STRL REUS W/TWL LRG LVL3 (GOWN DISPOSABLE) ×6
GOWN STRL REUS W/TWL XL LVL3 (GOWN DISPOSABLE) ×3
KIT BASIN OR (CUSTOM PROCEDURE TRAY) ×3 IMPLANT
KIT TURNOVER KIT B (KITS) ×3 IMPLANT
NS IRRIG 1000ML POUR BTL (IV SOLUTION) ×3 IMPLANT
PAD ARMBOARD 7.5X6 YLW CONV (MISCELLANEOUS) ×3 IMPLANT
PENCIL SMOKE EVACUATOR (MISCELLANEOUS) ×3 IMPLANT
POUCH SPECIMEN RETRIEVAL 10MM (ENDOMECHANICALS) IMPLANT
SCISSORS LAP 5X35 DISP (ENDOMECHANICALS) ×3 IMPLANT
SET IRRIG TUBING LAPAROSCOPIC (IRRIGATION / IRRIGATOR) ×3 IMPLANT
SET TUBE SMOKE EVAC HIGH FLOW (TUBING) ×3 IMPLANT
SLEEVE ENDOPATH XCEL 5M (ENDOMECHANICALS) ×6 IMPLANT
SPECIMEN JAR SMALL (MISCELLANEOUS) ×3 IMPLANT
SUT MNCRL AB 4-0 PS2 18 (SUTURE) ×3 IMPLANT
TOWEL GREEN STERILE (TOWEL DISPOSABLE) ×3 IMPLANT
TOWEL GREEN STERILE FF (TOWEL DISPOSABLE) ×3 IMPLANT
TRAY LAPAROSCOPIC MC (CUSTOM PROCEDURE TRAY) ×3 IMPLANT
TROCAR XCEL BLUNT TIP 100MML (ENDOMECHANICALS) ×3 IMPLANT
TROCAR XCEL NON-BLD 5MMX100MML (ENDOMECHANICALS) ×3 IMPLANT
WATER STERILE IRR 1000ML POUR (IV SOLUTION) ×3 IMPLANT

## 2019-02-24 NOTE — Discharge Instructions (Signed)
[facility] ______[facility] °LAPAROSCOPIC SURGERY: POST OP INSTRUCTIONS °Always review your discharge instruction sheet given to you by the facility where your surgery was performed. °IF YOU HAVE DISABILITY OR FAMILY LEAVE FORMS, YOU MUST BRING THEM TO THE OFFICE FOR PROCESSING.   °DO NOT GIVE THEM TO YOUR DOCTOR. ° °1. A prescription for pain medication may be given to you upon discharge.  Take your pain medication as prescribed, if needed.  If narcotic pain medicine is not needed, then you may take acetaminophen (Tylenol) or ibuprofen (Advil) as needed. °2. Take your usually prescribed medications unless otherwise directed. °3. If you need a refill on your pain medication, please contact your pharmacy.  They will contact our office to request authorization. Prescriptions will not be filled after 5pm or on week-ends. °4. You should follow a light diet the first few days after arrival home, such as soup and crackers, etc.  Be sure to include lots of fluids daily. °5. Most patients will experience some swelling and bruising in the area of the incisions.  Ice packs will help.  Swelling and bruising can take several days to resolve.  °6. It is common to experience some constipation if taking pain medication after surgery.  Increasing fluid intake and taking a stool softener (such as Colace) will usually help or prevent this problem from occurring.  A mild laxative (Milk of Magnesia or Miralax) should be taken according to package instructions if there are no bowel movements after 48 hours. °7. Unless discharge instructions indicate otherwise, you may remove your bandages 24-48 hours after surgery, and you may shower at that time.  You may have steri-strips (small skin tapes) in place directly over the incision.  These strips should be left on the skin for 7-10 days.  If your surgeon used skin glue on the incision, you may shower in 24 hours.  The glue will flake off over the next 2-3 weeks.  Any sutures or  staples will be removed at the office during your follow-up visit. °8. ACTIVITIES:  You may resume regular (light) daily activities beginning the next day--such as daily self-care, walking, climbing stairs--gradually increasing activities as tolerated.  You may have sexual intercourse when it is comfortable.  Refrain from any heavy lifting or straining until approved by your doctor. °a. You may drive when you are no longer taking prescription pain medication, you can comfortably wear a seatbelt, and you can safely maneuver your car and apply brakes. °b. RETURN TO WORK:  __________________________________________________________ °9. You should see your doctor in the office for a follow-up appointment approximately 2-3 weeks after your surgery.  Make sure that you call for this appointment within a day or two after you arrive home to insure a convenient appointment time. °10. OTHER INSTRUCTIONS: __________________________________________________________________________________________________________________________ __________________________________________________________________________________________________________________________ °WHEN TO CALL YOUR DOCTOR: °1. Fever over 101.0 °2. Inability to urinate °3. Continued bleeding from incision. °4. Increased pain, redness, or drainage from the incision. °5. Increasing abdominal pain ° °The clinic staff is available to answer your questions during regular business hours.  Please don’t hesitate to call and ask to speak to one of the nurses for clinical concerns.  If you have a medical emergency, go to the nearest emergency room or call 911.  A surgeon from [facility] is always on call at the hospital. °[address], [address], [place], [place]  [address] ? [address] [address], [place], [place]   [address] °[phone] ? [phone] ? FAX [phone] °Web site:   [email or web address] °

## 2019-02-24 NOTE — Progress Notes (Signed)
I have attempted to reach her daughter via phone to update her on her surgery but have been unsuccessful thus far in reaching her - have obtained generic voicemail  Nadeen Landau, M.D. [facility] Use [email or web address] to contact on call provider

## 2019-02-24 NOTE — Progress Notes (Signed)
PROGRESS NOTE  [name] [id number] DOB: [birth year] DOA: 02/20/2019 PCP: Abner Greenspan, MD   LOS: 3 days   Brief narrative: As per HPI,  [name] is a 79 y.o. female with medical history significant of diet-controlled diabetes, hypertension, hyperlipidemia presented with abdominal pain, nausea, vomiting.  Symptoms started 2 days ago on the epigastric area. Initially she thought it is her GERD.  She tried Tylenol did not help. ED Course: CT showed acute pancreatitis and gallstone, with normal lipase, WBC count elevated.  Patient was then admitted to hospital for acute gallstone pancreatitis.  Assessment/Plan:  Active Problems:   Pancreatitis, acute   Acute pancreatitis  Acute gallstone pancreatitis,status post MRCP.  Acute pancreatitis has significantly improved.  MRCP shows a gallstone with intrahepatic biliary dilatation but no choledocholithiasis.   Continue to hold statins for now.  Patient received Rocephin and metronidazole.  WBC trending down.  No fever.  Will discontinue tomorrow.  Seen by surgery and plan for lap cholecystectomy  02/24/2019.   Significant hypokalemia.  Hypophosphatemia.  Hypomagnesemia.  Aggressively replenished with IV KCl IV potassium phosphate and magnesium sulfate IV.  Yesterday.  Patient with 3.3 today.  We will give 40 mEq IV today.  Check magnesium and phosphorus level in a.m.  Elevated LFT presentation.  Improving..  Likely secondary to gallstone pancreatitis.  Essential hypertension,  as needed hydralazine for now.  Holding lisinopril and HCTZ as outpatient.  Mild acute kidney injury likely secondary to volume depletion.    Creatinine of 0.9 today  Diabetes mellitus, diet controlled, hemoglobin A1c however at 6.6.   Might need to be started on oral medication as per primary after discharge.  VTE Prophylaxis: Heparin subcu  Code Status: Full code  Family Communication: None today.  Spoke with the patient's daughter yesterday on the  phone  Disposition Plan: Home likely by tomorrow, awaiting for cholecystectomy.   Consultants:  GI  General surgery  Procedures:  MRCP  Antibiotics:  Anti-infectives (From admission, onward)   Start     Dose/Rate Route Frequency Ordered Stop   02/21/19 1900  [MAR Hold]  metroNIDAZOLE (FLAGYL) tablet 500 mg     (MAR Hold since Fri 02/24/2019 at 1007.Hold Reason: Transfer to a Procedural area.)   500 mg Oral Every 8 hours 02/21/19 1716     02/21/19 1800  [MAR Hold]  cefTRIAXone (ROCEPHIN) 1 g in sodium chloride 0.9 % 100 mL IVPB     (MAR Hold since Fri 02/24/2019 at 1007.Hold Reason: Transfer to a Procedural area.)   1 g 200 mL/hr over 30 Minutes Intravenous Every 24 hours 02/21/19 1716        Subjective: Today, patient denies any abdominal pain nausea vomiting fever chills.  Is n.p.o. for the procedure.  Objective: Vitals:   02/24/19 0433 02/24/19 0951  BP: (!) 149/66 (!) 158/70  Pulse: 69 72  Resp: 16 17  Temp: 98.9 F (37.2 C) 98.3 F (36.8 C)  SpO2: 99% 99%    Intake/Output Summary (Last 24 hours) at 02/24/2019 1340 Last data filed at 02/24/2019 1337 Gross per 24 hour  Intake 2504.96 ml  Output 10 ml  Net 2494.96 ml   Filed Weights   02/20/19 2222 02/24/19 1010  Weight: 65.8 kg 65.8 kg   Body mass index is 28.32 kg/m.   Physical Exam:  GENERAL: Patient is alert awake and communicative. Not in obvious distress. HENT: No scleral pallor or icterus. Pupils equally reactive to light. Oral mucosa is moist NECK: is supple, no  palpable thyroid enlargement. CHEST: Clear to auscultation. No crackles or wheezes. Non tender on palpation. Diminished breath sounds bilaterally. CVS: S1 and S2 heard, no murmur. Regular rate and rhythm. No pericardial rub. ABDOMEN: Soft, no tenderness on deep palpation, bowel sounds are present. EXTREMITIES: No edema. CNS: Cranial nerves are intact. No focal motor or sensory deficits. SKIN: warm and dry without rashes.  Data Review: I  have personally reviewed the following laboratory data and studies,  CBC: Recent Labs  Lab 02/20/19 2243 02/22/19 0304 02/23/19 0144 02/24/19 0411  WBC 16.7* 19.8* 15.6* 13.3*  HGB 13.9 12.9 11.7* 11.9*  HCT 40.1 36.8 34.7* 34.7*  MCV 82.3 82.0 81.6 80.7  PLT 333 267 230 [id number]   Basic Metabolic Panel: Recent Labs  Lab 02/20/19 2243 02/22/19 0304 02/23/19 0144 02/23/19 1757 02/24/19 0411  NA 129* 134* 136 135 138  K 4.0 3.9 2.7* 3.2* 3.3*  CL 89* 97* 101 98 103  CO2 28 28 28 25 24   GLUCOSE 90 136* 126* 147* 118*  BUN 45* 15 7* <5* <5*  CREATININE 1.62* 1.46* 0.98 0.95 0.84  CALCIUM 9.3 8.9 8.4* 8.8* 8.5*  MG  --   --  1.6*  --   --   PHOS  --   --  1.0*  --   --    Liver Function Tests: Recent Labs  Lab 02/20/19 2243 02/22/19 0304 02/23/19 0144 02/24/19 0411  AST 67* 497* 198* 85*  ALT 31 606* 333* 218*  ALKPHOS 95 176* 130* 117  BILITOT 3.7* 1.4* 1.0 0.8  PROT 7.9 6.6 6.1* 6.1*  ALBUMIN 3.3* 3.3* 3.0* 2.9*   Recent Labs  Lab 02/20/19 2243 02/22/19 0304 02/23/19 0144 02/24/19 0411  LIPASE 27 782* 129* 44   No results for input(s): AMMONIA in the last 168 hours. Cardiac Enzymes: No results for input(s): CKTOTAL, CKMB, CKMBINDEX, TROPONINI in the last 168 hours. BNP (last 3 results) No results for input(s): BNP in the last 8760 hours.  ProBNP (last 3 results) No results for input(s): PROBNP in the last 8760 hours.  CBG: Recent Labs  Lab 02/23/19 0812 02/23/19 1221 02/23/19 1733 02/23/19 1948 02/24/19 0745  GLUCAP 118* 142* 143* 118* 99   Recent Results (from the past 240 hour(s))  Respiratory Panel by RT PCR (Flu A&B, Covid) - Nasopharyngeal Swab     Status: None   Collection Time: 02/21/19 11:45 PM   Specimen: Nasopharyngeal Swab  Result Value Ref Range Status   SARS Coronavirus 2 by RT PCR NEGATIVE NEGATIVE Final    Comment: (NOTE) SARS-CoV-2 target nucleic acids are NOT DETECTED. The SARS-CoV-2 RNA is generally detectable in upper  respiratoy specimens during the acute phase of infection. The lowest concentration of SARS-CoV-2 viral copies this assay can detect is 131 copies/mL. A negative result does not preclude SARS-Cov-2 infection and should not be used as the sole basis for treatment or other patient management decisions. A negative result may occur with  improper specimen collection/handling, submission of specimen other than nasopharyngeal swab, presence of viral mutation(s) within the areas targeted by this assay, and inadequate number of viral copies (<131 copies/mL). A negative result must be combined with clinical observations, patient history, and epidemiological information. The expected result is Negative. Fact Sheet for Patients:  PinkCheek.be Fact Sheet for Healthcare Providers:  GravelBags.it This test is not yet ap proved or cleared by the [place] FDA and  has been authorized for detection and/or diagnosis of SARS-CoV-2 by FDA under an Emergency  Use Authorization (EUA). This EUA will remain  in effect (meaning this test can be used) for the duration of the COVID-19 declaration under Section 564(b)(1) of the Act, 21 U.S.C. section 360bbb-3(b)(1), unless the authorization is terminated or revoked sooner.    Influenza A by PCR NEGATIVE NEGATIVE Final   Influenza B by PCR NEGATIVE NEGATIVE Final    Comment: (NOTE) The Xpert Xpress SARS-CoV-2/FLU/RSV assay is intended as an aid in  the diagnosis of influenza from Nasopharyngeal swab specimens and  should not be used as a sole basis for treatment. Nasal washings and  aspirates are unacceptable for Xpert Xpress SARS-CoV-2/FLU/RSV  testing. Fact Sheet for Patients: PinkCheek.be Fact Sheet for Healthcare Providers: GravelBags.it This test is not yet approved or cleared by the [place] FDA and  has been authorized for  detection and/or diagnosis of SARS-CoV-2 by  FDA under an Emergency Use Authorization (EUA). This EUA will remain  in effect (meaning this test can be used) for the duration of the  Covid-19 declaration under Section 564(b)(1) of the Act, 21  U.S.C. section 360bbb-3(b)(1), unless the authorization is  terminated or revoked. Performed at [facility], [place] [address] [place], [place] [address]   MRSA PCR Screening     Status: None   Collection Time: 02/24/19  9:44 AM   Specimen: Nasal Mucosa; Nasopharyngeal  Result Value Ref Range Status   MRSA by PCR NEGATIVE NEGATIVE Final    Comment:        The GeneXpert MRSA Assay (FDA approved for NASAL specimens only), is one component of a comprehensive MRSA colonization surveillance program. It is not intended to diagnose MRSA infection nor to guide or monitor treatment for MRSA infections. Performed at [facility], [place] [address], [place], [place] [address]      Studies: No results found.  Scheduled Meds: . [MAR Hold] aspirin EC  81 mg Oral Daily  . [MAR Hold] cholecalciferol  1,000 Units Oral Daily  . [MAR Hold] heparin  5,000 Units Subcutaneous Q8H  . [MAR Hold] insulin aspart  0-9 Units Subcutaneous TID WC  . [MAR Hold] lactobacillus acidophilus  2 tablet Oral TID  . [MAR Hold] metroNIDAZOLE  500 mg Oral Q8H  . [MAR Hold] multivitamin with minerals  1 tablet Oral Daily  . [MAR Hold] sodium chloride flush  3 mL Intravenous Once    Continuous Infusions: . sodium chloride 75 mL/hr at 02/23/19 2331  . [MAR Hold] cefTRIAXone (ROCEPHIN)  IV 1 g (02/23/19 1708)     Flora Lipps, MD  [facility] 02/24/2019

## 2019-02-24 NOTE — Anesthesia Procedure Notes (Signed)
Procedure Name: Intubation Date/Time: 02/24/2019 12:20 PM Performed by: Imagene Riches, CRNA Pre-anesthesia Checklist: Patient identified, Emergency Drugs available, Suction available and Patient being monitored Patient Re-evaluated:Patient Re-evaluated prior to induction Oxygen Delivery Method: Circle System Utilized Preoxygenation: Pre-oxygenation with 100% oxygen Induction Type: IV induction Ventilation: Mask ventilation without difficulty Laryngoscope Size: Miller and 2 Grade View: Grade I Tube type: Oral Tube size: 7.0 mm Number of attempts: 1 Airway Equipment and Method: Stylet and Oral airway Placement Confirmation: ETT inserted through vocal cords under direct vision,  positive ETCO2 and breath sounds checked- equal and bilateral Secured at: 21 cm Tube secured with: Tape Dental Injury: Teeth and Oropharynx as per pre-operative assessment

## 2019-02-24 NOTE — Progress Notes (Signed)
[facility] Progress Note     Subjective: CC-  Abd pain has resolved. Denies n/v. Ambulating around room without issues. Lipase has normalized  Objective: Vital signs in last 24 hours: Temp:  [98.6 F (37 C)-99.1 F (37.3 C)] 98.9 F (37.2 C) (01/08 0433) Pulse Rate:  [66-70] 69 (01/08 0433) Resp:  [16-18] 16 (01/08 0433) BP: (136-149)/(65-69) 149/66 (01/08 0433) SpO2:  [98 %-99 %] 99 % (01/08 0433) Last BM Date: 02/23/19  Intake/Output from previous day: 01/07 0701 - 01/08 0700 In: 2065 [P.O.:520; I.V.:504.6; IV Piggyback:1040.4] Out: -  Intake/Output this shift: No intake/output data recorded.  PE: Gen:  Alert, NAD, pleasant HEENT: EOM's intact, pupils equal and round Card:  RRR Pulm:  CTAB, no W/R/R, rate and effort normal Abd: Soft, nontender, nondistended Psych: A&Ox3  Skin: no rashes noted, warm and dry  Lab Results:  Recent Labs    02/23/19 0144 02/24/19 0411  WBC 15.6* 13.3*  HGB 11.7* 11.9*  HCT 34.7* 34.7*  PLT 230 253   BMET Recent Labs    02/23/19 1757 02/24/19 0411  NA 135 138  K 3.2* 3.3*  CL 98 103  CO2 25 24  GLUCOSE 147* 118*  BUN <5* <5*  CREATININE 0.95 0.84  CALCIUM 8.8* 8.5*   PT/INR No results for input(s): LABPROT, INR in the last 72 hours. CMP     Component Value Date/Time   NA 138 02/24/2019 0411   K 3.3 (L) 02/24/2019 0411   K 3.5 01/09/2014 1519   CL 103 02/24/2019 0411   CO2 24 02/24/2019 0411   GLUCOSE 118 (H) 02/24/2019 0411   BUN <5 (L) 02/24/2019 0411   CREATININE 0.84 02/24/2019 0411   CALCIUM 8.5 (L) 02/24/2019 0411   PROT 6.1 (L) 02/24/2019 0411   ALBUMIN 2.9 (L) 02/24/2019 0411   AST 85 (H) 02/24/2019 0411   ALT 218 (H) 02/24/2019 0411   ALKPHOS 117 02/24/2019 0411   BILITOT 0.8 02/24/2019 0411   GFRNONAA >60 02/24/2019 0411   GFRAA >60 02/24/2019 0411   Lipase     Component Value Date/Time   LIPASE 44 02/24/2019 0411       Studies/Results: No results  found.  Anti-infectives: Anti-infectives (From admission, onward)   Start     Dose/Rate Route Frequency Ordered Stop   02/21/19 1900  metroNIDAZOLE (FLAGYL) tablet 500 mg     500 mg Oral Every 8 hours 02/21/19 1716     02/21/19 1800  cefTRIAXone (ROCEPHIN) 1 g in sodium chloride 0.9 % 100 mL IVPB     1 g 200 mL/hr over 30 Minutes Intravenous Every 24 hours 02/21/19 1716         Assessment/Plan HTN HLD DM  Fibroid uterus - per primary, may need further workup   Gallstone Pancreatitis  Elevated LFT's, since normalized. Lipase has normalized - MRCP 1/6 w/out evidence of CBD stone.  - Lipase and LFTs normal and tenderness has resolved. NPO for OR.] - Planning laparoscopic cholecystectomy today. Please refer to consult note 1/6 for details regarding discussion. We re-reviewed procedure, material risks, benefits and alternatives. We again discussed rational for surgery is to decrease risk of recurrent pancreatitis in setting of gallstones as almost certainly the cause. Her questions were welcomed and answered today. She expressed understanding and has opted to proceed. - She has requested I update her daughter, Baxter Hire, at conclusion via phone.  VTE - SCDs, SQH   LOS: 3 days   Nadeen Landau, M.D. [facility] Use [email or web address] to contact on call provider

## 2019-02-24 NOTE — Anesthesia Preprocedure Evaluation (Addendum)
Anesthesia Evaluation  Patient identified by MRN, date of birth, ID band Patient awake    Reviewed: Allergy & Precautions, NPO status , Patient's Chart, lab work & pertinent test results  History of Anesthesia Complications Negative for: history of anesthetic complications  Airway Mallampati: II  TM Distance: >3 FB Neck ROM: Full    Dental  (+) Edentulous Upper, Edentulous Lower   Pulmonary neg pulmonary ROS,    Pulmonary exam normal        Cardiovascular hypertension, Pt. on medications Normal cardiovascular exam     Neuro/Psych negative neurological ROS  negative psych ROS   GI/Hepatic negative GI ROS,  Elevated LFTs    Endo/Other  diabetes, Type 2 Hypokalemia Hypomagnesemia Hypophosphatemia   Renal/GU Renal disease     Musculoskeletal negative musculoskeletal ROS (+)   Abdominal   Peds  Hematology  (+) anemia ,   Anesthesia Other Findings   Reproductive/Obstetrics                            Anesthesia Physical Anesthesia Plan  ASA: III  Anesthesia Plan: General   Post-op Pain Management:    Induction: Intravenous  PONV Risk Score and Plan: 4 or greater and Treatment may vary due to age or medical condition, Ondansetron and Propofol infusion  Airway Management Planned: Oral ETT  Additional Equipment: None  Intra-op Plan:   Post-operative Plan: Extubation in OR  Informed Consent: I have reviewed the patients History and Physical, chart, labs and discussed the procedure including the risks, benefits and alternatives for the proposed anesthesia with the patient or authorized representative who has indicated his/her understanding and acceptance.     Dental advisory given  Plan Discussed with: CRNA and Anesthesiologist  Anesthesia Plan Comments:        Anesthesia Quick Evaluation

## 2019-02-24 NOTE — Transfer of Care (Signed)
Immediate Anesthesia Transfer of Care Note  Patient: [name]  Procedure(s) Performed: LAPAROSCOPIC CHOLECYSTECTOMY (N/A Abdomen)  Patient Location: PACU  Anesthesia Type:General  Level of Consciousness: drowsy  Airway & Oxygen Therapy: Patient Spontanous Breathing and Patient connected to nasal cannula oxygen  Post-op Assessment: Report given to RN and Post -op Vital signs reviewed and stable  Post vital signs: Reviewed and stable  Last Vitals:  Vitals Value Taken Time  BP 142/58 02/24/19 1402  Temp 36.1 C 02/24/19 1348  Pulse 66 02/24/19 1412  Resp 17 02/24/19 1412  SpO2 96 % 02/24/19 1412  Vitals shown include unvalidated device data.  Last Pain:  Vitals:   02/24/19 1358  TempSrc:   PainSc: 0-No pain         Complications: No apparent anesthesia complications

## 2019-02-24 NOTE — Op Note (Signed)
02/20/2019 - 02/24/2019 1:30 PM  PATIENT: [name]  79 y.o. female  Patient Care Team: Tower, Wynelle Fanny, MD as PCP - General  PRE-OPERATIVE DIAGNOSIS: Biliary/gallstone pancreatitis  POST-OPERATIVE DIAGNOSIS: Same  PROCEDURE: Laparoscopic cholecystectomy  SURGEON: Sharon Mt. Dema Severin, MD  ASSISTANT: Judyann Munson, RNFA  ANESTHESIA: General endotracheal  EBL: No intake/output data recorded.  DRAINS: None  SPECIMEN: Gallbladder  COUNTS: Sponge, needle and instrument counts were reported correct x2 at the conclusion of the operation  DISPOSITION: PACU in satisfactory condition  COMPLICATIONS: None  FINDINGS: Acutely inflamed appearing gallbladder with chronic adhesions within triangle of Calot but able to safely dissect. Critical view of safety obtained prior to clipping or dividing any structures.  INDICATION: Ms. Bruch is a very pleasant 17yoF with HTN, HLD & DM - presented 4d ago with MEG abdominal pain and n/v. Her symptoms have gradually improved. She was found on workup to have findings consistent with gallstone pancreatitis. Her LFTs did show elevated bilirubin but MRCP showed no filling defects in the common bile duct. Her lipase normalized as did her LFTs and her abdominal pain resolved. We discussed proceeding with laparoscopic cholecystectomy this admission to reduce the potential for recurrent biliary pancreatitis. Please refer to notes elswhere for details regarding this discussion   DESCRIPTION:  The patient was identified & brought into the operating room. She was then positioned supine on the OR table. SCDs were in place and active during the entire case. She then underwent general endotracheal anesthesia. Pressure points were padded. The abdomen was prepped and draped in the standard sterile fashion. Antibiotics were administered. A surgical timeout was performed and confirmed our plan.  A infraumbilical incision was made. The umbilical stalk was grasped and  retracted outwardly. The infraumbilical fascia was identified and incised. The peritoneal cavity was gently entered bluntly. A purse-string 0 Vicryl suture was placed. The Hasson cannula was inserted into the peritoneal cavity and insufflation with CO2 commenced to 23mmHg. A laparoscope was inserted into the peritoneal cavity and inspection confirmed no evidence of trocar site complications. The patient was then positioned in reverse Trendelenburg with slight left side down. 3 additional 87mm trocars were placed along the right subcostal line - one 72mm port in mid subcostal region, another 27mm port in the right flank near the anterior axillary line, and a third 70mm port in the left subxiphoid region obliquely near the falciform ligament.  The liver and gallbladder were inspected. The gallbladder was injected and inflamed in appearance. The gallbladder fundus was grasped and elevated cephalad. An additional grasper was then placed on the infundibulum of the gallbladder and the infundibulum was retracted laterally. Staying high on the gallbladder, the peritoneum on both sides of the gallbladder was opened with hook cautery. Gentle blunt dissection was then employed with a IT consultant working down into [employer]. There were fibrotic, chronic appearing adhesions within the triangle that were tediously dissected. The cystic duct was identified and carefully circumferentially dissected. The cystic artery was also identified and carefully circumferentially dissected. The space between the cystic artery and hepatocystic plate was developed such that a good view of the liver could be seen through a window medial to the cystic artery. The triangle of Calot had been cleared of all fibrofatty tissue. At this point, a critical view of safety was achieved and the only structures visualized was the skeletonized cystic duct laterally, the skeletonized cystic artery and the liver through the window medial to the  artery. No posterior cystic artery was noted  A cholangiogram was not performed at this point as the cystic duct was diminutive and somewhat foreshortened increasing the risk for potential avulsion or back-wall injury to common bile duct.  The cystic duct and artery were clipped with 2 clips on the patient side and 1 clip on the specimen side. The cystic duct and artery were then divided. The gallbladder was then freed from its remaining attachments to the liver using electrocautery and placed into an endocatch bag. The RUQ was gently irrigated with sterile saline. Hemostasis was then verified. The clips were in good position; the gallbladder fossa was dry. The rest of the abdomen was inspected no injury nor bleeding elsewhere was identified.  The endocatch bag containing the gallbladder was then removed from the umbilical port site and passed off as specimen. The RUQ ports were removed under direct visualization and noted to be hemostatic. The umbilical fascia was then closed using the 0 Vicryl purse-string suture. The fascia was palpated and noted to be completely closed. The skin of all incision sites was approximated with 4-0 monocryl subcuticular suture and dermabond applied. She was then awakened from anesthesia, extubated, and transferred to a stretcher for transport to PACU in satisfactory condition.

## 2019-02-24 NOTE — Plan of Care (Signed)
  Problem: Education: Goal: Knowledge of General Education information will improve Description Including pain rating scale, medication(s)/side effects and non-pharmacologic comfort measures Outcome: Progressing   

## 2019-02-25 LAB — COMPREHENSIVE METABOLIC PANEL
ALT: 176 U/L — ABNORMAL HIGH (ref 0–44)
AST: 96 U/L — ABNORMAL HIGH (ref 15–41)
Albumin: 3 g/dL — ABNORMAL LOW (ref 3.5–5.0)
Alkaline Phosphatase: 115 U/L (ref 38–126)
Anion gap: 13 (ref 5–15)
BUN: 6 mg/dL — ABNORMAL LOW (ref 8–23)
CO2: 21 mmol/L — ABNORMAL LOW (ref 22–32)
Calcium: 8.6 mg/dL — ABNORMAL LOW (ref 8.9–10.3)
Chloride: 103 mmol/L (ref 98–111)
Creatinine, Ser: 0.94 mg/dL (ref 0.44–1.00)
GFR calc Af Amer: >60 mL/min (ref >60)
GFR calc non Af Amer: 59 mL/min — ABNORMAL LOW (ref >60)
Glucose, Bld: 86 mg/dL (ref 70–99)
Potassium: 3.8 mmol/L (ref 3.5–5.1)
Sodium: 137 mmol/L (ref 135–145)
Total Bilirubin: 0.9 mg/dL (ref 0.3–1.2)
Total Protein: 6.3 g/dL — ABNORMAL LOW (ref 6.5–8.1)

## 2019-02-25 LAB — GLUCOSE, CAPILLARY
Glucose-Capillary: 92 mg/dL (ref 70–99)
Glucose-Capillary: 88 mg/dL (ref 70–99)

## 2019-02-25 LAB — CBC
HCT: 35.2 % — ABNORMAL LOW (ref 36.0–46.0)
Hemoglobin: 12.4 g/dL (ref 12.0–15.0)
MCH: 28 pg (ref 26.0–34.0)
MCHC: 35.2 g/dL (ref 30.0–36.0)
MCV: 79.5 fL — ABNORMAL LOW (ref 80.0–100.0)
Platelets: UNDETERMINED 10*3/uL (ref 150–400)
RBC: 4.43 MIL/uL (ref 3.87–5.11)
RDW: 14.5 % (ref 11.5–15.5)
WBC: 9.7 10*3/uL (ref 4.0–10.5)
nRBC: 0 % (ref 0.0–0.2)

## 2019-02-25 LAB — MAGNESIUM: Magnesium: 1.9 mg/dL (ref 1.7–2.4)

## 2019-02-25 LAB — PHOSPHORUS: Phosphorus: 2.2 mg/dL — ABNORMAL LOW (ref 2.5–4.6)

## 2019-02-25 MED ORDER — OXYCODONE HCL 5 MG PO TABS: 5.0000 mg | ORAL_TABLET | Freq: Four times a day (QID) | ORAL | 0 refills | Status: DC | PRN

## 2019-02-25 NOTE — Progress Notes (Signed)
PROGRESS NOTE  [name] [id number] DOB: 1940/05/23 DOA: 02/20/2019 PCP: Abner Greenspan, MD   LOS: 4 days   Brief narrative: As per HPI,  [name] is a 79 y.o. female with medical history significant of diet-controlled diabetes, hypertension, hyperlipidemia presented with abdominal pain, nausea, vomiting.  Symptoms started 2 days ago on the epigastric area. Initially she thought it is her GERD.  She tried Tylenol did not help. ED Course: CT showed acute pancreatitis and gallstone, with normal lipase, WBC count elevated.  Patient was then admitted to hospital for acute gallstone pancreatitis.  02/24/2018: Patient seen.  Patient has undergone cholecystectomy.  Surgical team has placed orders for discharge.  No new complaints.  Assessment/Plan:  Active Problems:   Pancreatitis, acute   Acute pancreatitis  Acute gallstone pancreatitis,status post MRCP.  Acute pancreatitis has significantly improved.  MRCP shows a gallstone with intrahepatic biliary dilatation but no choledocholithiasis.   Continue to hold statins for now.  Patient received Rocephin and metronidazole.  WBC trending down.  No fever.  Will discontinue tomorrow.  Seen by surgery and plan for lap cholecystectomy  02/24/2019.  02/25/2019: Patient has undergone lap cholecystectomy.  Surgical team has placed discharge plans for patient.    Significant hypokalemia.  Hypophosphatemia.  Hypomagnesemia.  Aggressively replenished with IV KCl IV potassium phosphate and magnesium sulfate IV.  Yesterday.  Patient with 3.3 today.  We will give 40 mEq IV today.  Check magnesium and phosphorus level in a.m. 02/25/2019: Improved significantly.  Potassium is 3.8, magnesium is 1.9 weight phosphorus of 2.2.  Elevated LFT presentation.  Improving..  Likely secondary to gallstone pancreatitis.  Essential hypertension,  as needed hydralazine for now.  Holding lisinopril and HCTZ as outpatient.  Mild acute kidney injury likely secondary to  volume depletion.     02/25/2019: Acute kidney injury has resolved.  Serum creatinine prior to discharge was 0.94.  Diabetes mellitus, diet controlled, hemoglobin A1c however at 6.6.   Might need to be started on oral medication as per primary after discharge.  VTE Prophylaxis: Heparin subcu  Code Status: Full code  Family Communication: None today.  Spoke with the patient's daughter yesterday on the phone  Disposition Plan: Home likely by tomorrow, awaiting for cholecystectomy.   Consultants:  GI  General surgery  Procedures:  MRCP  Lap cholecystectomy on 02/24/2019  Antibiotics:  Anti-infectives (From admission, onward)   Start     Dose/Rate Route Frequency Ordered Stop   02/21/19 1900  metroNIDAZOLE (FLAGYL) tablet 500 mg  Status:  Discontinued     500 mg Oral Every 8 hours 02/21/19 1716 02/24/19 1343   02/21/19 1800  cefTRIAXone (ROCEPHIN) 1 g in sodium chloride 0.9 % 100 mL IVPB  Status:  Discontinued     1 g 200 mL/hr over 30 Minutes Intravenous Every 24 hours 02/21/19 1716 02/24/19 1343      Subjective: No new complaints.    Objective: Vitals:   02/25/19 0617 02/25/19 0746  BP: 138/69 (!) 148/69  Pulse: 67 66  Resp: 14   Temp: 98.8 F (37.1 C) 98.8 F (37.1 C)  SpO2: 98% 98%    Intake/Output Summary (Last 24 hours) at 02/25/2019 1606 Last data filed at 02/25/2019 0356 Gross per 24 hour  Intake 731.05 ml  Output 2 ml  Net 729.05 ml   Filed Weights   02/20/19 2222 02/24/19 1010  Weight: 65.8 kg 65.8 kg   Body mass index is 28.32 kg/m.   Physical Exam:  GENERAL: Patient  is alert awake and communicative. Not in obvious distress. HENT: No scleral pallor or icterus. Pupils equally reactive to light. Oral mucosa is moist NECK: is supple, no palpable thyroid enlargement. CHEST: Clear to auscultation. No crackles or wheezes. Non tender on palpation. Diminished breath sounds bilaterally. CVS: S1 and S2 heard, no murmur. Regular rate and rhythm. No  pericardial rub. ABDOMEN: Soft, no tenderness on deep palpation, bowel sounds are present. EXTREMITIES: No edema. CNS: Cranial nerves are intact. No focal motor or sensory deficits. SKIN: warm and dry without rashes.  Data Review: I have personally reviewed the following laboratory data and studies,  CBC: Recent Labs  Lab 02/20/19 2243 02/22/19 0304 02/23/19 0144 02/24/19 0411 02/25/19 0345  WBC 16.7* 19.8* 15.6* 13.3* 9.7  HGB 13.9 12.9 11.7* 11.9* 12.4  HCT 40.1 36.8 34.7* 34.7* 35.2*  MCV 82.3 82.0 81.6 80.7 79.5*  PLT 333 267 230 253 PLATELET CLUMPS NOTED ON SMEAR, UNABLE TO ESTIMATE   Basic Metabolic Panel: Recent Labs  Lab 02/22/19 0304 02/23/19 0144 02/23/19 1757 02/24/19 0411 02/25/19 0345  NA 134* 136 135 138 137  K 3.9 2.7* 3.2* 3.3* 3.8  CL 97* 101 98 103 103  CO2 28 28 25 24  21*  GLUCOSE 136* 126* 147* 118* 86  BUN 15 7* <5* <5* 6*  CREATININE 1.46* 0.98 0.95 0.84 0.94  CALCIUM 8.9 8.4* 8.8* 8.5* 8.6*  MG  --  1.6*  --   --  1.9  PHOS  --  1.0*  --   --  2.2*   Liver Function Tests: Recent Labs  Lab 02/20/19 2243 02/22/19 0304 02/23/19 0144 02/24/19 0411 02/25/19 0345  AST 67* 497* 198* 85* 96*  ALT 31 606* 333* 218* 176*  ALKPHOS 95 176* 130* 117 115  BILITOT 3.7* 1.4* 1.0 0.8 0.9  PROT 7.9 6.6 6.1* 6.1* 6.3*  ALBUMIN 3.3* 3.3* 3.0* 2.9* 3.0*   Recent Labs  Lab 02/20/19 2243 02/22/19 0304 02/23/19 0144 02/24/19 0411  LIPASE 27 782* 129* 44   No results for input(s): AMMONIA in the last 168 hours. Cardiac Enzymes: No results for input(s): CKTOTAL, CKMB, CKMBINDEX, TROPONINI in the last 168 hours. BNP (last 3 results) No results for input(s): BNP in the last 8760 hours.  ProBNP (last 3 results) No results for input(s): PROBNP in the last 8760 hours.  CBG: Recent Labs  Lab 02/24/19 1349 02/24/19 1657 02/24/19 2003 02/25/19 0747 02/25/19 0909  GLUCAP 102* 104* 112* 92 88   Recent Results (from the past 240 hour(s))    Respiratory Panel by RT PCR (Flu A&B, Covid) - Nasopharyngeal Swab     Status: None   Collection Time: 02/21/19 11:45 PM   Specimen: Nasopharyngeal Swab  Result Value Ref Range Status   SARS Coronavirus 2 by RT PCR NEGATIVE NEGATIVE Final    Comment: (NOTE) SARS-CoV-2 target nucleic acids are NOT DETECTED. The SARS-CoV-2 RNA is generally detectable in upper respiratoy specimens during the acute phase of infection. The lowest concentration of SARS-CoV-2 viral copies this assay can detect is 131 copies/mL. A negative result does not preclude SARS-Cov-2 infection and should not be used as the sole basis for treatment or other patient management decisions. A negative result may occur with  improper specimen collection/handling, submission of specimen other than nasopharyngeal swab, presence of viral mutation(s) within the areas targeted by this assay, and inadequate number of viral copies (<131 copies/mL). A negative result must be combined with clinical observations, patient history, and epidemiological information. The  expected result is Negative. Fact Sheet for Patients:  PinkCheek.be Fact Sheet for Healthcare Providers:  GravelBags.it This test is not yet ap proved or cleared by the [place] FDA and  has been authorized for detection and/or diagnosis of SARS-CoV-2 by FDA under an Emergency Use Authorization (EUA). This EUA will remain  in effect (meaning this test can be used) for the duration of the COVID-19 declaration under Section 564(b)(1) of the Act, 21 U.S.C. section 360bbb-3(b)(1), unless the authorization is terminated or revoked sooner.    Influenza A by PCR NEGATIVE NEGATIVE Final   Influenza B by PCR NEGATIVE NEGATIVE Final    Comment: (NOTE) The Xpert Xpress SARS-CoV-2/FLU/RSV assay is intended as an aid in  the diagnosis of influenza from Nasopharyngeal swab specimens and  should not be used as a sole  basis for treatment. Nasal washings and  aspirates are unacceptable for Xpert Xpress SARS-CoV-2/FLU/RSV  testing. Fact Sheet for Patients: PinkCheek.be Fact Sheet for Healthcare Providers: GravelBags.it This test is not yet approved or cleared by the [place] FDA and  has been authorized for detection and/or diagnosis of SARS-CoV-2 by  FDA under an Emergency Use Authorization (EUA). This EUA will remain  in effect (meaning this test can be used) for the duration of the  Covid-19 declaration under Section 564(b)(1) of the Act, 21  U.S.C. section 360bbb-3(b)(1), unless the authorization is  terminated or revoked. Performed at [facility], [place] [address] [place], [place] [address]   MRSA PCR Screening     Status: None   Collection Time: 02/24/19  9:44 AM   Specimen: Nasal Mucosa; Nasopharyngeal  Result Value Ref Range Status   MRSA by PCR NEGATIVE NEGATIVE Final    Comment:        The GeneXpert MRSA Assay (FDA approved for NASAL specimens only), is one component of a comprehensive MRSA colonization surveillance program. It is not intended to diagnose MRSA infection nor to guide or monitor treatment for MRSA infections. Performed at [facility], [place] [address] [place], [place] [address]      Studies: No results found.  Scheduled Meds: . aspirin EC  81 mg Oral Daily  . cholecalciferol  1,000 Units Oral Daily  . heparin  5,000 Units Subcutaneous Q8H  . insulin aspart  0-9 Units Subcutaneous TID WC  . lactobacillus acidophilus  2 tablet Oral TID  . multivitamin with minerals  1 tablet Oral Daily  . sodium chloride flush  3 mL Intravenous Once    Continuous Infusions:    Bonnell Public, MD  [facility] 02/25/2019

## 2019-02-25 NOTE — Discharge Summary (Signed)
Physician Discharge Summary  Patient ID: [name] MRN: [id number] DOB/AGE: [birth year] 79 y.o.  PCP: Abner Greenspan, MD  Admit date: 02/20/2019 Discharge date: 02/25/2019  Admission Diagnoses:  pancreatitis  Discharge Diagnoses:  Gallstone pancreatitis  Active Problems:   Pancreatitis, acute   Acute pancreatitis   Surgery:  Laparoscopic cholecystectomy  Discharged Condition: improved  Hospital Course:   Admitted with pancreatitis and found to be gallstone related.  Lap chole by Dr. Annye English on 1/8 and patient is ready for discharge on 1/9.    Consults: Dr. Meriel Pica  Significant Diagnostic Studies: none    Discharge Exam: Blood pressure (!) 148/69, pulse 66, temperature 98.8 F (37.1 C), temperature source Oral, resp. rate 14, height 5' (1.524 m), weight 65.8 kg, SpO2 98 %. Incisions OK  Disposition: Discharge disposition: 01-Home or Self Care       Discharge Instructions    Call MD for:  persistant nausea and vomiting   Complete by: As directed    Call MD for:  redness, tenderness, or signs of infection (pain, swelling, redness, odor or green/yellow discharge around incision site)   Complete by: As directed    Diet - low sodium heart healthy   Complete by: As directed    Increase activity slowly   Complete by: As directed    No wound care   Complete by: As directed      Allergies as of 02/25/2019      Reactions   Ibuprofen Palpitations   Sulfamethoxazole-trimethoprim Rash      Medication List    TAKE these medications   Accu-Chek Aviva Plus w/Device Kit Use to check glucose once daily for DM 2 (Dx. E11.9)   Accu-Chek FastClix Lancets Misc Check glucose once daily (Dx. E11.9)   aspirin 81 MG tablet Take 81 mg by mouth daily.   atorvastatin 10 MG tablet Commonly known as: LIPITOR Take 1 tablet (10 mg total) by mouth daily. In evening What changed:   when to take this  additional instructions   cholecalciferol 1000 units  tablet Commonly known as: VITAMIN D Take 1,000 Units by mouth daily.   glucose blood test strip Commonly known as: Accu-Chek Aviva Plus Use to check blood sugar once daliy (dx. E11.9)   hydrochlorothiazide 25 MG tablet Commonly known as: HYDRODIURIL Take 1 tablet (25 mg total) by mouth every morning.   lisinopril 40 MG tablet Commonly known as: ZESTRIL Take 1 tablet (40 mg total) by mouth daily.   multivitamin tablet Take 1 tablet by mouth daily.   oxyCODONE 5 MG immediate release tablet Commonly known as: Oxy IR/ROXICODONE Take 1 tablet (5 mg total) by mouth every 6 (six) hours as needed for severe pain.   potassium chloride SA 20 MEQ tablet Commonly known as: KLOR-CON Take 1 tablet (20 mEq total) by mouth daily.   SYSTANE FREE OP Place 2 drops into both eyes daily.      Follow-up [facility] Follow up on 03/09/2019.   Specialty: General Surgery Why: 01/21 at 1:30 pm a provider will call you for your telehealth follow up appointment. Please send a photo of your incisions 2 days prior to [email or web address] and include your name and DOB in the subject line Contact information: [address] [place] [phone]          Signed: Pedro Earls 02/25/2019, 8:04 AM

## 2019-02-26 NOTE — Anesthesia Postprocedure Evaluation (Signed)
Anesthesia Post Note  Patient: [name]  Procedure(s) Performed: LAPAROSCOPIC CHOLECYSTECTOMY (N/A Abdomen)     Patient location during evaluation: PACU Anesthesia Type: General Level of consciousness: awake and alert Pain management: pain level controlled Vital Signs Assessment: post-procedure vital signs reviewed and stable Respiratory status: spontaneous breathing, respiratory function stable and nonlabored ventilation Cardiovascular status: blood pressure returned to baseline and stable Postop Assessment: no apparent nausea or vomiting Anesthetic complications: no    Last Vitals:  Vitals:   02/25/19 0617 02/25/19 0746  BP: 138/69 (!) 148/69  Pulse: 67 66  Resp: 14   Temp: 37.1 C 37.1 C  SpO2: 98% 98%    Last Pain:  Vitals:   02/25/19 0746  TempSrc: Oral  PainSc:                  Audry Pili

## 2019-02-27 ENCOUNTER — Telehealth: Payer: Self-pay

## 2019-02-27 LAB — SURGICAL PATHOLOGY

## 2019-02-27 NOTE — Telephone Encounter (Signed)
Transition Care Management Follow-up Telephone Call  Date of discharge and from where: [facility]  How have you been since you were released from the hospital? Patient states that she is feeling so much better. Patient has no complaints at this time.   Any questions or concerns? No   Items Reviewed:  Did the pt receive and understand the discharge instructions provided? Yes   Medications obtained and verified? Yes   Any new allergies since your discharge? No   Dietary orders reviewed? Yes  Do you have support at home? Yes   Functional Questionnaire: (I = Independent and D = Dependent) ADLs: I  Bathing/Dressing- I  Meal Prep- I  Eating- I  Maintaining continence- I  Transferring/Ambulation- I  Managing Meds- I  Follow up appointments reviewed:   PCP Hospital f/u appt confirmed? Yes  Scheduled to see Dr. Glori Bickers  on 03/06/2019 @ 10:45 am virtually.  [facility] f/u appt confirmed? Yes  Scheduled to see surgeon.  Are transportation arrangements needed? No   If their condition worsens, is the pt aware to call PCP or go to the Emergency Dept.? Yes  Was the patient provided with contact information for the PCP's office or ED? Yes  Was to pt encouraged to call back with questions or concerns? Yes

## 2019-03-06 ENCOUNTER — Encounter: Payer: Self-pay | Admitting: Family Medicine

## 2019-03-06 ENCOUNTER — Ambulatory Visit (INDEPENDENT_AMBULATORY_CARE_PROVIDER_SITE_OTHER): Payer: Medicare Other | Admitting: Family Medicine

## 2019-03-06 DIAGNOSIS — E119 Type 2 diabetes mellitus without complications: Secondary | ICD-10-CM

## 2019-03-06 DIAGNOSIS — Z8719 Personal history of other diseases of the digestive system: Secondary | ICD-10-CM

## 2019-03-06 DIAGNOSIS — Z9049 Acquired absence of other specified parts of digestive tract: Secondary | ICD-10-CM

## 2019-03-06 DIAGNOSIS — E876 Hypokalemia: Secondary | ICD-10-CM | POA: Diagnosis not present

## 2019-03-06 DIAGNOSIS — R790 Abnormal level of blood mineral: Secondary | ICD-10-CM | POA: Diagnosis not present

## 2019-03-06 DIAGNOSIS — I10 Essential (primary) hypertension: Secondary | ICD-10-CM

## 2019-03-06 NOTE — Assessment & Plan Note (Signed)
Pt is back on her home BP medications s/p ccy and doing well  She does not check BP at home

## 2019-03-06 NOTE — Assessment & Plan Note (Signed)
Lower in the hospital (Reviewed hospital records, lab results and studies in detail ) Lab Results  Component Value Date   K 3.8 02/25/2019   better on d/c Re check 2-4 wk Pt is back to taking 20 meq daily  She is on hctz

## 2019-03-06 NOTE — Progress Notes (Signed)
Virtual Visit via Telephone Note  I connected with [name] on 03/06/19 at 10:45 AM EST by telephone and verified that I am speaking with the correct person using two identifiers.  Location: Patient: home Provider: office    I discussed the limitations, risks, security and privacy concerns of performing an evaluation and management service by telephone and the availability of in person appointments. I also discussed with the patient that there may be a patient responsible charge related to this service. The patient expressed understanding and agreed to proceed.  Parties involved in encounter  Patient: [name]  Provider:  Loura Pardon MD    History of Present Illness: Pt presents for f/u of recent hospitalization  Pt was [facility] from 1/4 to [facility] pancreatitis   She presented with abdominal pain in the epigastric area that did not improve with gerd medication and tylenol   In ED Ct showed acute pancreatitis and gallstones   Imaging; CT Abdomen Pelvis Wo Contrast  Result Date: 02/21/2019 CLINICAL DATA:  Nausea and vomiting. EXAM: CT ABDOMEN AND PELVIS WITHOUT CONTRAST TECHNIQUE: Multidetector CT imaging of the abdomen and pelvis was performed following the standard protocol without IV contrast. COMPARISON:  None. FINDINGS: Lower chest: No acute abnormality. Hepatobiliary: No focal liver abnormality is seen. Numerous subcentimeter gallstones are seen within the gallbladder without evidence of gallbladder wall thickening or biliary dilatation. Pancreas: Mild peripancreatic inflammatory fat stranding is seen within the region of the tail of the pancreas. Spleen: Normal in size without focal abnormality. Adrenals/Urinary Tract: Adrenal glands are unremarkable. Kidneys are normal, without renal calculi, focal lesion, or hydronephrosis. Bladder is unremarkable. Stomach/Bowel: There is a small hiatal hernia. Appendix appears normal. No evidence of  bowel wall thickening, distention, or inflammatory changes. Vascular/Lymphatic: There is mild aortic atherosclerosis. No enlarged abdominal or pelvic lymph nodes. Reproductive: A 3.4 cm x 2.2 cm calcified uterine fibroid is seen along the anterolateral aspect of the uterus on the left. The bilateral adnexa are unremarkable. Other: Musculoskeletal: There is marked severity dextroscoliosis of the lumbar spine with marked severity multilevel degenerative changes. IMPRESSION: 1. Findings consistent with acute pancreatitis. Correlation with pancreatic enzymes is recommended. 2. Cholelithiasis. 3. Small hiatal hernia. 4. Calcified uterine fibroid. Electronically Signed   By: Virgina Norfolk M.D.   On: 02/21/2019 15:40   MR ABDOMEN MRCP W WO CONTAST  Result Date: 02/22/2019 CLINICAL DATA:  Pancreatitis. EXAM: MRI ABDOMEN WITHOUT AND WITH CONTRAST (INCLUDING MRCP) TECHNIQUE: Multiplanar multisequence MR imaging of the abdomen was performed both before and after the administration of intravenous contrast. Heavily T2-weighted images of the biliary and pancreatic ducts were obtained, and three-dimensional MRCP images were rendered by post processing. CONTRAST:  6.67m GADAVIST GADOBUTROL 1 MMOL/ML IV SOLN COMPARISON:  CT 02/21/2019 FINDINGS: Lower chest: Unremarkable. Hepatobiliary: Evaluation of liver is motion degraded. Within this limitation, no gross liver lesion evident. Multiple stones are seen in the gallbladder measuring up to 2 cm diameter. Mild intrahepatic biliary duct dilatation noted with common bile duct diameter in the head of pancreas measuring upper normal at 6 mm. Axial single shot imaging shows some areas of flow artifact in the common duct, but dedicated fluid sensitive MRCP imaging shows no evidence for choledocholithiasis. Pancreas: Assessment of pancreatic parenchyma degraded by motion artifact. No gross pancreatic mass lesion evident. No substantial dilatation of the main pancreatic duct. Pancreatic  and peripancreatic edema noted. No evidence of pancreas divisum. Spleen:  Grossly unremarkable. Adrenals/Urinary Tract: No adrenal nodule or  mass. Kidneys unremarkable. Stomach/Bowel: Stomach is nondistended. No small bowel or colonic dilatation within the visualized abdomen. Vascular/Lymphatic: No abdominal aortic aneurysm. No abdominal lymphadenopathy. Other: Small volume free fluid. Fibroid changes noted in the uterus with prominent endometrium. Musculoskeletal: No abnormal marrow enhancement within the visualized bony anatomy IMPRESSION: 1. Cholelithiasis with mild intrahepatic biliary duct distension. Extrahepatic common duct measures upper normal for size with no definite findings of choledocholithiasis. 2. Diffuse pancreatic and peripancreatic edema consistent with pancreatitis. No pancreas divisum. No dilatation of the main pancreatic duct. 3. Small volume intraperitoneal free fluid. 4. Fibroid change in the uterus with prominent endometrium in this postmenopausal female. Consider pelvic ultrasound to further evaluate. Electronically Signed   By: Misty Stanley M.D.   On: 02/22/2019 08:12     MRCP showed gallstone with intrahepatic biliary dilatation but no choledocholithiasis  She received Rocephin and metronizdazole  Low K , phos and mag were noted and they were replaced   Her HCTZ was held as well as lisinopril    Lap cholecystectomy was done by Dr Dema Severin on 1/8  With much improvement  Oxycodone was px for pain post op  Lab Results  Component Value Date   CREATININE 0.94 02/25/2019   BUN 6 (L) 02/25/2019   NA 137 02/25/2019   K 3.8 02/25/2019   CL 103 02/25/2019   CO2 21 (L) 02/25/2019   Lab Results  Component Value Date   CALCIUM 8.6 (L) 02/25/2019   PHOS 2.2 (L) 02/25/2019    Lab Results  Component Value Date   ALT 176 (H) 02/25/2019   AST 96 (H) 02/25/2019   ALKPHOS 115 02/25/2019   BILITOT 0.9 02/25/2019   Lab Results  Component Value Date   WBC 9.7 02/25/2019   HGB  12.4 02/25/2019   HCT 35.2 (L) 02/25/2019   MCV 79.5 (L) 02/25/2019   PLT PLATELET CLUMPS NOTED ON SMEAR, UNABLE TO ESTIMATE 02/25/2019   Lab Results  Component Value Date   LIPASE 44 02/24/2019    Lab Results  Component Value Date   HGBA1C 6.6 (H) 02/22/2019   Also had a neg covid 19 test    F/u planned with [facility] on Thursday  Her incisions look good /not red or painful    Today: she is feeling much better  No post op pain   She is trying to eat soup /light things - going ok  No diarrhea  (in fact uses miralax)  No straining   No nausea at all   This am her glucose was 133 before she ate  A little higher than usual   Now back on all her medications   Patient Active Problem List   Diagnosis Date Noted  . Low magnesium level 03/06/2019  . Low serum phosphorus for age 26/18/2021  . History of cholecystectomy 03/06/2019  . History of pancreatitis 02/21/2019  . Left hand paresthesia 12/27/2018  . Elevated TSH 06/28/2018  . Right leg pain 09/13/2015  . Routine general medical examination at a health care facility 04/05/2015  . Estrogen deficiency 04/05/2015  . Hypokalemia 09/04/2013  . Nonspecific abnormal electrocardiogram (ECG) (EKG) 08/30/2013  . Encounter for Medicare annual wellness exam 03/07/2013  . Fullness of supraclavicular fossa 02/27/2011  . Other screening mammogram 02/25/2011  . Gynecological examination 02/25/2011  . CERVICAL RADICULOPATHY, LEFT 03/31/2010  . DM type 2 (diabetes mellitus, type 2) ([place]) 07/06/2007  . FIBROIDS, UTERUS 03/23/2007  . Hyperlipidemia associated with type 2 diabetes mellitus ([place]) 03/23/2007  .  Essential hypertension 03/23/2007  . POSTMENOPAUSAL STATUS 03/23/2007  . MURMUR 03/23/2007   Past Medical History:  Diagnosis Date  . Acute pancreatitis 02/22/2019  . 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 Fibroids   . Gallstones    incidental asymptomatic gallstones  . Heart murmur   . Hyperlipidemia   .  Hypertension    Past Surgical History:  Procedure Laterality Date  . CHOLECYSTECTOMY N/A 02/24/2019   Procedure: LAPAROSCOPIC CHOLECYSTECTOMY;  Surgeon: Ileana Roup, MD;  Location: [facility];  Service: General;  Laterality: N/A;  . NO PAST SURGERIES     Social History   Tobacco Use  . Smoking status: Never Smoker  . Smokeless tobacco: Never Used  Substance Use Topics  . Alcohol use: No    Alcohol/week: 0.0 standard drinks  . Drug use: No   Family History  Problem Relation Age of Onset  . Hypertension Mother   . Diabetes Mother   . Osteoporosis Sister   . Diabetes Brother    Allergies  Allergen Reactions  . Ibuprofen Palpitations  . Sulfamethoxazole-Trimethoprim Rash   Current Outpatient Medications on File Prior to Visit  Medication Sig Dispense Refill  . ACCU-CHEK FASTCLIX LANCETS MISC Check glucose once daily (Dx. E11.9) 102 each 1  . aspirin 81 MG tablet Take 81 mg by mouth daily.    Marland Kitchen atorvastatin (LIPITOR) 10 MG tablet Take 1 tablet (10 mg total) by mouth daily. In evening (Patient taking differently: Take 10 mg by mouth daily at 6 PM. ) 90 tablet 3  . Blood Glucose Monitoring Suppl (ACCU-CHEK AVIVA PLUS) w/Device KIT Use to check glucose once daily for DM 2 (Dx. E11.9) 1 kit 0  . cholecalciferol (VITAMIN D) 1000 UNITS tablet Take 1,000 Units by mouth daily.    Marland Kitchen glucose blood (ACCU-CHEK AVIVA PLUS) test strip Use to check blood sugar once daliy (dx. E11.9) 100 each 3  . hydrochlorothiazide (HYDRODIURIL) 25 MG tablet Take 1 tablet (25 mg total) by mouth every morning. 90 tablet 3  . lisinopril (ZESTRIL) 40 MG tablet Take 1 tablet (40 mg total) by mouth daily. 90 tablet 3  . Multiple Vitamin (MULTIVITAMIN) tablet Take 1 tablet by mouth daily.      Marland Kitchen oxyCODONE (OXY IR/ROXICODONE) 5 MG immediate release tablet Take 1 tablet (5 mg total) by mouth every 6 (six) hours as needed for severe pain. 15 tablet 0  . Polyethyl Glycol-Propyl Glycol (SYSTANE FREE OP) Place 2 drops  into both eyes daily.    . potassium chloride SA (K-DUR) 20 MEQ tablet Take 1 tablet (20 mEq total) by mouth daily. 90 tablet 3   No current facility-administered medications on file prior to visit.    Observations/Objective: Pt sounds well and not distressed (her usual self) Not hoarse /no slurring- speech is normal  Mood is good/cheerful  Voices no pain /feeling much better  Nl cognition-good historian  Review of Systems  Constitutional: Negative for chills, fever and malaise/fatigue.  HENT: Negative for congestion, ear pain, sinus pain and sore throat.   Eyes: Negative for blurred vision, discharge and redness.  Respiratory: Negative for cough, shortness of breath and stridor.   Cardiovascular: Negative for chest pain, palpitations and leg swelling.  Gastrointestinal: Negative for abdominal pain, diarrhea, nausea and vomiting.  Musculoskeletal: Negative for myalgias.  Skin: Negative for rash.  Neurological: Negative for dizziness and headaches.    Assessment and Plan: Problem List Items Addressed This Visit      Cardiovascular and Mediastinum  Essential hypertension    Pt is back on her home BP medications s/p ccy and doing well  She does not check BP at home      Relevant Orders   CBC with Differential/Platelet   Hepatic function panel   Renal function panel     Endocrine   DM type 2 (diabetes mellitus, type 2) ([place])    Lab Results  Component Value Date   HGBA1C 6.6 (H) 02/22/2019   Pt just had ccy/hosp and her am glucose is in the 130s  (a little higher than usual for her)  She is gradually adv diet w/o problems and waiting to be released to full activity        Other   Hypokalemia    Lower in the hospital (Reviewed hospital records, lab results and studies in detail ) Lab Results  Component Value Date   K 3.8 02/25/2019   better on d/c Re check 2-4 wk Pt is back to taking 20 meq daily  She is on hctz      Relevant Orders   Renal function panel    History of pancreatitis - Primary    Recent hosp for gallstone pancreatitis  Reviewed hospital records, lab results and studies in detail   CT was re assuring otherwise S/p ccy pt is doing very well /no pain at all  Healing well and has f/u planned with surg on Thursday Planning lab here in 2-4 weeks for liver and chemistries (incl mag and ca and phos)  inst to call if any abd pain  She continues to adv diet gradually      Relevant Orders   Hepatic function panel   Low magnesium level    1.6 at admit to hosp for gallstone pancreatitis  This normalized with supplementation  Will re check at upcoming labs here       Relevant Orders   Magnesium   Low serum phosphorus for age    Calcium and phos were low upon admit for gallstone pancreatitis  Phos was replaced Planning lab here in 2-4 wk for re check       Relevant Orders   Phosphorus   VITAMIN D 25 Hydroxy (Vit-D Deficiency, Fractures)   History of cholecystectomy    For recent gallstone pancreatitis  Reviewed hospital records, lab results and studies in detail   Clinically doing very well  As surgical f/u on thurs  No complaints  Gradually advancing diet and activity      Relevant Orders   Hepatic function panel       Follow Up Instructions: Schedule a lab appt with [place] in 2-4 weeks Have surgeon visit on Thursday as planned If any symptoms return let us know  Advance diet gradually    I discussed the assessment and treatment plan with the patient. The patient was provided an opportunity to ask questions and all were answered. The patient agreed with the plan and demonstrated an understanding of the instructions.   The patient was advised to call back or seek an in-person evaluation if the symptoms worsen or if the condition fails to improve as anticipated.  I provided 30 minutes of non-face-to-face time during this encounter.   Loura Pardon, MD

## 2019-03-06 NOTE — Assessment & Plan Note (Signed)
Lab Results  Component Value Date   HGBA1C 6.6 (H) 02/22/2019   Pt just had ccy/hosp and her am glucose is in the 130s  (a little higher than usual for her)  She is gradually adv diet w/o problems and waiting to be released to full activity

## 2019-03-06 NOTE — Assessment & Plan Note (Signed)
For recent gallstone pancreatitis  Reviewed hospital records, lab results and studies in detail   Clinically doing very well  As surgical f/u on thurs  No complaints  Gradually advancing diet and activity

## 2019-03-06 NOTE — Assessment & Plan Note (Signed)
1.6 at admit to hosp for gallstone pancreatitis  This normalized with supplementation  Will re check at upcoming labs here

## 2019-03-06 NOTE — Assessment & Plan Note (Signed)
Calcium and phos were low upon admit for gallstone pancreatitis  Phos was replaced Planning lab here in 2-4 wk for re check

## 2019-03-06 NOTE — Assessment & Plan Note (Signed)
Recent hosp for gallstone pancreatitis  Reviewed hospital records, lab results and studies in detail   CT was re assuring otherwise S/p ccy pt is doing very well /no pain at all  Healing well and has f/u planned with surg on Thursday Planning lab here in 2-4 weeks for liver and chemistries (incl mag and ca and phos)  inst to call if any abd pain  She continues to adv diet gradually

## 2019-03-25 ENCOUNTER — Ambulatory Visit: Payer: Medicare Other | Attending: Internal Medicine

## 2019-03-25 DIAGNOSIS — Z23 Encounter for immunization: Secondary | ICD-10-CM | POA: Insufficient documentation

## 2019-03-25 NOTE — Progress Notes (Signed)
   Covid-19 Vaccination Clinic  Name:  [name]    MRN: [id number] DOB: 10-08-1940  03/25/2019  Ms. Truglio was observed post Covid-19 immunization for 15 minutes without incidence. She was provided with Vaccine Information Sheet and instruction to access the V-Safe system.   Ms. Morishita was instructed to call 911 with any severe reactions post vaccine: Marland Kitchen Difficulty breathing  . Swelling of your face and throat  . A fast heartbeat  . A bad rash all over your body  . Dizziness and weakness    Immunizations Administered    Name Date Dose VIS Date Route   Pfizer COVID-19 Vaccine 03/25/2019 11:12 AM 0.3 mL 01/27/2019 Intramuscular   Manufacturer: [place]   Lot: [id number]   [place]: [id number]

## 2019-03-28 ENCOUNTER — Other Ambulatory Visit: Payer: Self-pay

## 2019-03-28 ENCOUNTER — Other Ambulatory Visit (INDEPENDENT_AMBULATORY_CARE_PROVIDER_SITE_OTHER): Payer: Medicare Other

## 2019-03-28 DIAGNOSIS — I10 Essential (primary) hypertension: Secondary | ICD-10-CM

## 2019-03-28 DIAGNOSIS — E876 Hypokalemia: Secondary | ICD-10-CM

## 2019-03-28 DIAGNOSIS — Z8719 Personal history of other diseases of the digestive system: Secondary | ICD-10-CM | POA: Diagnosis not present

## 2019-03-28 DIAGNOSIS — Z9049 Acquired absence of other specified parts of digestive tract: Secondary | ICD-10-CM | POA: Diagnosis not present

## 2019-03-28 DIAGNOSIS — R790 Abnormal level of blood mineral: Secondary | ICD-10-CM | POA: Diagnosis not present

## 2019-03-28 LAB — CBC WITH DIFFERENTIAL/PLATELET
Basophils Absolute: 0.1 10*3/uL (ref 0.0–0.1)
Basophils Relative: 0.8 % (ref 0.0–3.0)
Eosinophils Absolute: 0.2 10*3/uL (ref 0.0–0.7)
Eosinophils Relative: 2 % (ref 0.0–5.0)
HCT: 38.7 % (ref 36.0–46.0)
Hemoglobin: 12.9 g/dL (ref 12.0–15.0)
Lymphocytes Relative: 37.3 % (ref 12.0–46.0)
Lymphs Abs: 3.2 10*3/uL (ref 0.7–4.0)
MCHC: 33.3 g/dL (ref 30.0–36.0)
MCV: 83.2 fl (ref 78.0–100.0)
Monocytes Absolute: 0.7 10*3/uL (ref 0.1–1.0)
Monocytes Relative: 8 % (ref 3.0–12.0)
Neutro Abs: 4.5 10*3/uL (ref 1.4–7.7)
Neutrophils Relative %: 51.9 % (ref 43.0–77.0)
Platelets: 239 10*3/uL (ref 150.0–400.0)
RBC: 4.65 Mil/uL (ref 3.87–5.11)
RDW: 14.4 % (ref 11.5–15.5)
WBC: 8.6 10*3/uL (ref 4.0–10.5)

## 2019-03-28 LAB — HEPATIC FUNCTION PANEL
ALT: 23 U/L (ref 0–35)
AST: 25 U/L (ref 0–37)
Albumin: 4.3 g/dL (ref 3.5–5.2)
Alkaline Phosphatase: 87 U/L (ref 39–117)
Bilirubin, Direct: 0.1 mg/dL (ref 0.0–0.3)
Total Bilirubin: 0.5 mg/dL (ref 0.2–1.2)
Total Protein: 7.9 g/dL (ref 6.0–8.3)

## 2019-03-28 LAB — MAGNESIUM: Magnesium: 1.9 mg/dL (ref 1.5–2.5)

## 2019-03-28 LAB — RENAL FUNCTION PANEL
Albumin: 4.3 g/dL (ref 3.5–5.2)
BUN: 9 mg/dL (ref 6–23)
CO2: 31 mEq/L (ref 19–32)
Calcium: 10.3 mg/dL (ref 8.4–10.5)
Chloride: 100 mEq/L (ref 96–112)
Creatinine, Ser: 0.9 mg/dL (ref 0.40–1.20)
GFR: 73.47 mL/min (ref >60.00)
Glucose, Bld: 110 mg/dL — ABNORMAL HIGH (ref 70–99)
Phosphorus: 3 mg/dL (ref 2.3–4.6)
Potassium: 3.6 mEq/L (ref 3.5–5.1)
Sodium: 137 mEq/L (ref 135–145)

## 2019-03-28 LAB — PHOSPHORUS: Phosphorus: 3 mg/dL (ref 2.3–4.6)

## 2019-03-28 LAB — VITAMIN D 25 HYDROXY (VIT D DEFICIENCY, FRACTURES): VITD: 57.4 ng/mL (ref 30.00–100.00)

## 2019-03-29 ENCOUNTER — Encounter: Payer: Self-pay | Admitting: *Deleted

## 2019-04-07 ENCOUNTER — Encounter: Payer: Self-pay | Admitting: Family Medicine

## 2019-04-07 ENCOUNTER — Ambulatory Visit (INDEPENDENT_AMBULATORY_CARE_PROVIDER_SITE_OTHER): Payer: Medicare Other | Admitting: Family Medicine

## 2019-04-07 ENCOUNTER — Other Ambulatory Visit: Payer: Self-pay

## 2019-04-07 ENCOUNTER — Telehealth: Payer: Self-pay

## 2019-04-07 VITALS — BP 160/80 | HR 77 | Temp 97.1°F | Ht 60.0 in | Wt 141.3 lb

## 2019-04-07 DIAGNOSIS — I10 Essential (primary) hypertension: Secondary | ICD-10-CM

## 2019-04-07 DIAGNOSIS — R142 Eructation: Secondary | ICD-10-CM | POA: Diagnosis not present

## 2019-04-07 MED ORDER — AMLODIPINE BESYLATE 5 MG PO TABS: 5.0000 mg | ORAL_TABLET | Freq: Every day | ORAL | 3 refills | Status: DC

## 2019-04-07 NOTE — Progress Notes (Signed)
Subjective:    Patient ID: [name], female    DOB: 08/11/1940, 79 y.o.   MRN: [id number]  This visit occurred during the SARS-CoV-2 public health emergency.  Safety protocols were in place, including screening questions prior to the visit, additional usage of staff PPE, and extensive cleaning of exam room while observing appropriate contact time as indicated for disinfecting solutions.    HPI Pt presents for elevated BP and also gas   Has had a lot of gas recently  Called EMS yesterday for this and her bp was 160/100 -this concerned her   Had pain under her L arm and then it went up to her L chest  Then she burped a lot and that relieved it  She has had more gas since her ccy recently   Ate green beans and sweet potatoes before symptoms started  No heartburn No stomach pain   She drinks ginger ale (recently regular not diet)    No diarrhea or abd pain  No black stool or blood in stool   Wt Readings from Last 3 Encounters:  04/07/19 141 lb 5 oz (64.1 kg)  02/24/19 145 lb (65.8 kg)  12/27/18 146 lb 6 oz (66.4 kg)   27.60 kg/m   Hypertension  Pt takes hctz 25 mg daily  Lisinopril 40 mg daily  K dur 20 meq  BP Readings from Last 3 Encounters:  04/07/19 (!) 160/80  02/25/19 (!) 148/69  12/27/18 134/86   Pulse Readings from Last 3 Encounters:  04/07/19 77  02/25/19 66  12/27/18 65     Lab Results  Component Value Date   CREATININE 0.90 03/28/2019   BUN 9 03/28/2019   NA 137 03/28/2019   K 3.6 03/28/2019   CL 100 03/28/2019   CO2 31 03/28/2019   Lab Results  Component Value Date   ALT 23 03/28/2019   AST 25 03/28/2019   ALKPHOS 87 03/28/2019   BILITOT 0.5 03/28/2019   Lab Results  Component Value Date   WBC 8.6 03/28/2019   HGB 12.9 03/28/2019   HCT 38.7 03/28/2019   MCV 83.2 03/28/2019   PLT 239.0 03/28/2019    DM2 Lab Results  Component Value Date   HGBA1C 6.6 (H) 02/22/2019    Patient Active Problem List   Diagnosis Date Noted    . Belching 04/07/2019  . Low magnesium level 03/06/2019  . Low serum phosphorus for age 76/18/2021  . History of cholecystectomy 03/06/2019  . History of pancreatitis 02/21/2019  . Left hand paresthesia 12/27/2018  . Elevated TSH 06/28/2018  . Right leg pain 09/13/2015  . Routine general medical examination at a health care facility 04/05/2015  . Estrogen deficiency 04/05/2015  . Hypokalemia 09/04/2013  . Nonspecific abnormal electrocardiogram (ECG) (EKG) 08/30/2013  . Encounter for Medicare annual wellness exam 03/07/2013  . Fullness of supraclavicular fossa 02/27/2011  . Other screening mammogram 02/25/2011  . Gynecological examination 02/25/2011  . CERVICAL RADICULOPATHY, LEFT 03/31/2010  . DM type 2 (diabetes mellitus, type 2) ([place]) 07/06/2007  . FIBROIDS, UTERUS 03/23/2007  . Hyperlipidemia associated with type 2 diabetes mellitus ([place]) 03/23/2007  . Essential hypertension 03/23/2007  . POSTMENOPAUSAL STATUS 03/23/2007  . MURMUR 03/23/2007   Past Medical History:  Diagnosis Date  . Acute pancreatitis 02/22/2019  . 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 Fibroids   . Gallstones    incidental asymptomatic gallstones  . Heart murmur   . Hyperlipidemia   . Hypertension  Past Surgical History:  Procedure Laterality Date  . CHOLECYSTECTOMY N/A 02/24/2019   Procedure: LAPAROSCOPIC CHOLECYSTECTOMY;  Surgeon: Ileana Roup, MD;  Location: [facility];  Service: General;  Laterality: N/A;  . NO PAST SURGERIES     Social History   Tobacco Use  . Smoking status: Never Smoker  . Smokeless tobacco: Never Used  Substance Use Topics  . Alcohol use: No    Alcohol/week: 0.0 standard drinks  . Drug use: No   Family History  Problem Relation Age of Onset  . Hypertension Mother   . Diabetes Mother   . Osteoporosis Sister   . Diabetes Brother    Allergies  Allergen Reactions  . Ibuprofen Palpitations  . Sulfamethoxazole-Trimethoprim Rash   Current Outpatient Medications  on File Prior to Visit  Medication Sig Dispense Refill  . ACCU-CHEK FASTCLIX LANCETS MISC Check glucose once daily (Dx. E11.9) 102 each 1  . aspirin 81 MG tablet Take 81 mg by mouth daily.    Marland Kitchen atorvastatin (LIPITOR) 10 MG tablet Take 1 tablet (10 mg total) by mouth daily. In evening (Patient taking differently: Take 10 mg by mouth daily at 6 PM. ) 90 tablet 3  . Blood Glucose Monitoring Suppl (ACCU-CHEK AVIVA PLUS) w/Device KIT Use to check glucose once daily for DM 2 (Dx. E11.9) 1 kit 0  . cholecalciferol (VITAMIN D) 1000 UNITS tablet Take 1,000 Units by mouth daily.    Marland Kitchen glucose blood (ACCU-CHEK AVIVA PLUS) test strip Use to check blood sugar once daliy (dx. E11.9) 100 each 3  . hydrochlorothiazide (HYDRODIURIL) 25 MG tablet Take 1 tablet (25 mg total) by mouth every morning. 90 tablet 3  . lisinopril (ZESTRIL) 40 MG tablet Take 1 tablet (40 mg total) by mouth daily. 90 tablet 3  . Multiple Vitamin (MULTIVITAMIN) tablet Take 1 tablet by mouth daily.      Vladimir Faster Glycol-Propyl Glycol (SYSTANE FREE OP) Place 2 drops into both eyes daily.    . potassium chloride SA (K-DUR) 20 MEQ tablet Take 1 tablet (20 mEq total) by mouth daily. 90 tablet 3   No current facility-administered medications on file prior to visit.     Review of Systems  Constitutional: Negative for activity change, appetite change, fatigue, fever and unexpected weight change.  HENT: Negative for congestion, ear pain, rhinorrhea, sinus pressure and sore throat.   Eyes: Negative for pain, redness and visual disturbance.  Respiratory: Negative for cough, shortness of breath and wheezing.   Cardiovascular: Negative for chest pain and palpitations.  Gastrointestinal: Negative for abdominal pain, blood in stool, constipation, diarrhea, nausea and vomiting.       Had chest discomfort/belching which is resolved  Endocrine: Negative for polydipsia and polyuria.  Genitourinary: Negative for dysuria, frequency and urgency.    Musculoskeletal: Negative for arthralgias, back pain and myalgias.  Skin: Negative for pallor and rash.  Allergic/Immunologic: Negative for environmental allergies.  Neurological: Negative for dizziness, syncope and headaches.  Hematological: Negative for adenopathy. Does not bruise/bleed easily.  Psychiatric/Behavioral: Negative for decreased concentration and dysphoric mood. The patient is not nervous/anxious.        Objective:   Physical Exam Constitutional:      General: She is not in acute distress.    Appearance: Normal appearance. She is well-developed and normal weight. She is not ill-appearing or diaphoretic.  HENT:     Head: Normocephalic and atraumatic.  Eyes:     Conjunctiva/sclera: Conjunctivae normal.     Pupils: Pupils are equal, round,  and reactive to light.  Neck:     Thyroid: No thyromegaly.     Vascular: No carotid bruit or JVD.  Cardiovascular:     Rate and Rhythm: Normal rate and regular rhythm.     Heart sounds: Normal heart sounds. No gallop.   Pulmonary:     Effort: Pulmonary effort is normal. No respiratory distress.     Breath sounds: Normal breath sounds. No wheezing or rales.  Abdominal:     General: Bowel sounds are normal. There is no distension or abdominal bruit.     Palpations: Abdomen is soft. There is no mass.     Tenderness: There is no abdominal tenderness. There is no right CVA tenderness, left CVA tenderness, guarding or rebound.     Hernia: No hernia is present.  Musculoskeletal:     Cervical back: Normal range of motion and neck supple.     Right lower leg: No edema.     Left lower leg: No edema.  Lymphadenopathy:     Cervical: No cervical adenopathy.  Skin:    General: Skin is warm and dry.     Coloration: Skin is not pale.     Findings: No erythema or rash.  Neurological:     Mental Status: She is alert. Mental status is at baseline.     Sensory: No sensory deficit.     Coordination: Coordination normal.     Deep Tendon  Reflexes: Reflexes are normal and symmetric. Reflexes normal.  Psychiatric:        Mood and Affect: Mood normal.           Assessment & Plan:   Problem List Items Addressed This Visit      Cardiovascular and Mediastinum   Essential hypertension - Primary    BP trending up and now high  BP: (!) 160/80 currently taking hctz and lisinopril  Adding amlodipine today  inst to update if any problems or side eff Dicussed checking bp at home and to bring cuff to next appt F/u in about a month  DASH eating plan enc        Relevant Medications   amLODipine (NORVASC) 5 MG tablet     Other   Belching    Pt had episode of cp relieved by belching Resolved Nl exam  No s/s of GERD but will watch for this  Disc avoidance of carbonation/straws/gum chewing  Also foods that worsen symptoms

## 2019-04-07 NOTE — Patient Instructions (Signed)
Your blood pressure is too high  Continue your current blood pressure medicines  Add amlodipine 5 mg daily (either am or pm)   Avoid extra sodium in your diet (eat less processed foods)  Walk when you can for exercise   Follow up in about a month for blood pressure  Make sure you have taken all your medicine and bring your cuff with you so I can test it   For gas-try gas-ex or myelicon over the counter as needed  If you develop heartburn or indigestion let me know   Avoid carbonated beverages Also drinking out of straws and chewing gum  Avoid any foods that give you excess gas   If chest pain occurs and does not go away- call us and go the ER if needed

## 2019-04-07 NOTE — Telephone Encounter (Signed)
Error

## 2019-04-09 NOTE — Assessment & Plan Note (Signed)
BP trending up and now high  BP: (!) 160/80 currently taking hctz and lisinopril  Adding amlodipine today  inst to update if any problems or side eff Dicussed checking bp at home and to bring cuff to next appt F/u in about a month  DASH eating plan enc

## 2019-04-09 NOTE — Assessment & Plan Note (Signed)
Pt had episode of cp relieved by belching Resolved Nl exam  No s/s of GERD but will watch for this  Disc avoidance of carbonation/straws/gum chewing  Also foods that worsen symptoms

## 2019-04-19 ENCOUNTER — Ambulatory Visit: Payer: Medicare Other | Attending: Internal Medicine

## 2019-04-19 DIAGNOSIS — Z23 Encounter for immunization: Secondary | ICD-10-CM | POA: Insufficient documentation

## 2019-04-19 NOTE — Progress Notes (Signed)
   Covid-19 Vaccination [facility]  Name:  [name]    MRN: [id number] DOB: 1940/10/11  04/19/2019  [name] was observed post Covid-19 immunization for 15 minutes without incident. She was provided with Vaccine Information Sheet and instruction to access the V-Safe system.   [name] was instructed to call 911 with any severe reactions post vaccine: Marland Kitchen Difficulty breathing  . Swelling of face and throat  . A fast heartbeat  . A bad rash all over body  . Dizziness and weakness   Immunizations Administered    Name Date Dose VIS Date Route   Pfizer COVID-19 Vaccine 04/19/2019 10:49 AM 0.3 mL 01/27/2019 Intramuscular   Manufacturer: [place]   Lot: [id number]   [place]: [id number]

## 2019-05-09 ENCOUNTER — Encounter: Payer: Self-pay | Admitting: Family Medicine

## 2019-05-09 ENCOUNTER — Other Ambulatory Visit: Payer: Self-pay

## 2019-05-09 ENCOUNTER — Ambulatory Visit (INDEPENDENT_AMBULATORY_CARE_PROVIDER_SITE_OTHER): Payer: Medicare Other | Admitting: Family Medicine

## 2019-05-09 VITALS — BP 124/68 | HR 87 | Temp 98.0°F | Ht 60.0 in | Wt 136.7 lb

## 2019-05-09 DIAGNOSIS — E119 Type 2 diabetes mellitus without complications: Secondary | ICD-10-CM | POA: Diagnosis not present

## 2019-05-09 DIAGNOSIS — I10 Essential (primary) hypertension: Secondary | ICD-10-CM

## 2019-05-09 MED ORDER — AMLODIPINE BESYLATE 5 MG PO TABS: 5.0000 mg | ORAL_TABLET | Freq: Every day | ORAL | 3 refills | Status: DC

## 2019-05-09 MED ORDER — ACCU-CHEK SOFTCLIX LANCETS MISC: 5 refills | Status: DC

## 2019-05-09 NOTE — Progress Notes (Signed)
Subjective:    Patient ID: [name], female    DOB: June 12, 1940, 79 y.o.   MRN: [id number]  This visit occurred during the SARS-CoV-2 public health emergency.  Safety protocols were in place, including screening questions prior to the visit, additional usage of staff PPE, and extensive cleaning of exam room while observing appropriate contact time as indicated for disinfecting solutions.    HPI Pt presents for f/u of HTN  Wt Readings from Last 3 Encounters:  05/09/19 136 lb 11.2 oz (62 kg)  04/07/19 141 lb 5 oz (64.1 kg)  02/24/19 145 lb (65.8 kg)  wt is down  She is eating better  Eating less sweets and candy    26.70 kg/m   More vegetables  Chicken  occ beef    Last visit BP was elevated with hctz and lisinopril  Added amlodipine  Discussed lifestyle habits  Much improved today bp is stable today  No cp or palpitations or headaches or edema  No side effects to medicines  BP Readings from Last 3 Encounters:  05/09/19 124/68  04/07/19 (!) 160/80  02/25/19 (!) 148/69     Her new BP machine at home works well -- [facility] meter  At home this am 128/72 with HR of 88  Usually 130s on top   Pulse Readings from Last 3 Encounters:  05/09/19 87  04/07/19 77  02/25/19 66   Supplements K with 20 meq daily  Lab Results  Component Value Date   CREATININE 0.90 03/28/2019   BUN 9 03/28/2019   NA 137 03/28/2019   K 3.6 03/28/2019   CL 100 03/28/2019   CO2 31 03/28/2019    Also takes atorvastatin  Lab Results  Component Value Date   CHOL 171 12/19/2018   HDL 75.20 12/19/2018   LDLCALC 81 12/19/2018   LDLDIRECT 111.2 02/28/2013   TRIG 76.0 12/19/2018   CHOLHDL 2 12/19/2018   She had her covid vaccines    Patient Active Problem List   Diagnosis Date Noted  . Belching 04/07/2019  . Low magnesium level 03/06/2019  . Low serum phosphorus for age 07/04/2019  . History of cholecystectomy 03/06/2019  . History of pancreatitis 02/21/2019  . Left hand  paresthesia 12/27/2018  . Elevated TSH 06/28/2018  . Right leg pain 09/13/2015  . Routine general medical examination at a health care facility 04/05/2015  . Estrogen deficiency 04/05/2015  . Hypokalemia 09/04/2013  . Nonspecific abnormal electrocardiogram (ECG) (EKG) 08/30/2013  . Encounter for Medicare annual wellness exam 03/07/2013  . Fullness of supraclavicular fossa 02/27/2011  . Other screening mammogram 02/25/2011  . Gynecological examination 02/25/2011  . CERVICAL RADICULOPATHY, LEFT 03/31/2010  . DM type 2 (diabetes mellitus, type 2) ([place]) 07/06/2007  . FIBROIDS, UTERUS 03/23/2007  . Hyperlipidemia associated with type 2 diabetes mellitus ([place]) 03/23/2007  . Essential hypertension 03/23/2007  . POSTMENOPAUSAL STATUS 03/23/2007  . MURMUR 03/23/2007   Past Medical History:  Diagnosis Date  . Acute pancreatitis 02/22/2019  . 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 Fibroids   . Gallstones    incidental asymptomatic gallstones  . Heart murmur   . Hyperlipidemia   . Hypertension    Past Surgical History:  Procedure Laterality Date  . CHOLECYSTECTOMY N/A 02/24/2019   Procedure: LAPAROSCOPIC CHOLECYSTECTOMY;  Surgeon: Ileana Roup, MD;  Location: [facility];  Service: General;  Laterality: N/A;  . NO PAST SURGERIES     Social History   Tobacco Use  . Smoking  status: Never Smoker  . Smokeless tobacco: Never Used  Substance Use Topics  . Alcohol use: No    Alcohol/week: 0.0 standard drinks  . Drug use: No   Family History  Problem Relation Age of Onset  . Hypertension Mother   . Diabetes Mother   . Osteoporosis Sister   . Diabetes Brother    Allergies  Allergen Reactions  . Ibuprofen Palpitations  . Sulfamethoxazole-Trimethoprim Rash   Current Outpatient Medications on File Prior to Visit  Medication Sig Dispense Refill  . ACCU-CHEK FASTCLIX LANCETS MISC Check glucose once daily (Dx. E11.9) 102 each 1  . aspirin 81 MG tablet Take 81 mg by mouth daily.    Marland Kitchen  atorvastatin (LIPITOR) 10 MG tablet Take 1 tablet (10 mg total) by mouth daily. In evening (Patient taking differently: Take 10 mg by mouth daily at 6 PM. ) 90 tablet 3  . Blood Glucose Monitoring Suppl (ACCU-CHEK AVIVA PLUS) w/Device KIT Use to check glucose once daily for DM 2 (Dx. E11.9) 1 kit 0  . cholecalciferol (VITAMIN D) 1000 UNITS tablet Take 1,000 Units by mouth daily.    Marland Kitchen glucose blood (ACCU-CHEK AVIVA PLUS) test strip Use to check blood sugar once daliy (dx. E11.9) 100 each 3  . hydrochlorothiazide (HYDRODIURIL) 25 MG tablet Take 1 tablet (25 mg total) by mouth every morning. 90 tablet 3  . lisinopril (ZESTRIL) 40 MG tablet Take 1 tablet (40 mg total) by mouth daily. 90 tablet 3  . Multiple Vitamin (MULTIVITAMIN) tablet Take 1 tablet by mouth daily.      Vladimir Faster Glycol-Propyl Glycol (SYSTANE FREE OP) Place 2 drops into both eyes daily.    . potassium chloride SA (K-DUR) 20 MEQ tablet Take 1 tablet (20 mEq total) by mouth daily. 90 tablet 3   No current facility-administered medications on file prior to visit.     Review of Systems  Constitutional: Negative for activity change, appetite change, fatigue, fever and unexpected weight change.  HENT: Negative for congestion, ear pain, rhinorrhea, sinus pressure and sore throat.   Eyes: Negative for pain, redness and visual disturbance.  Respiratory: Negative for cough, shortness of breath and wheezing.   Cardiovascular: Negative for chest pain and palpitations.  Gastrointestinal: Negative for abdominal pain, blood in stool, constipation and diarrhea.  Endocrine: Negative for polydipsia and polyuria.  Genitourinary: Negative for dysuria, frequency and urgency.  Musculoskeletal: Negative for arthralgias, back pain and myalgias.  Skin: Negative for pallor and rash.  Allergic/Immunologic: Negative for environmental allergies.  Neurological: Negative for dizziness, syncope and headaches.  Hematological: Negative for adenopathy. Does  not bruise/bleed easily.  Psychiatric/Behavioral: Negative for decreased concentration and dysphoric mood. The patient is not nervous/anxious.        Objective:   Physical Exam Constitutional:      General: She is not in acute distress.    Appearance: Normal appearance. She is well-developed and normal weight. She is not ill-appearing.  HENT:     Head: Normocephalic and atraumatic.  Eyes:     Conjunctiva/sclera: Conjunctivae normal.     Pupils: Pupils are equal, round, and reactive to light.  Neck:     Thyroid: No thyromegaly.     Vascular: No carotid bruit or JVD.  Cardiovascular:     Rate and Rhythm: Normal rate and regular rhythm.     Heart sounds: Normal heart sounds. No gallop.   Pulmonary:     Effort: Pulmonary effort is normal. No respiratory distress.     Breath  sounds: Normal breath sounds. No wheezing or rales.  Abdominal:     General: Abdomen is flat. There is no distension or abdominal bruit.     Palpations: There is no mass.  Musculoskeletal:     Cervical back: Normal range of motion and neck supple.  Lymphadenopathy:     Cervical: No cervical adenopathy.  Skin:    General: Skin is warm and dry.     Coloration: Skin is not pale.     Findings: No rash.  Neurological:     Mental Status: She is alert.     Sensory: No sensory deficit.     Deep Tendon Reflexes: Reflexes are normal and symmetric. Reflexes normal.  Psychiatric:        Mood and Affect: Mood normal.           Assessment & Plan:   Problem List Items Addressed This Visit      Cardiovascular and Mediastinum   Essential hypertension - Primary    bp is improved with addition of amlodipine 5 mg  No side effects or problems  bp in fair control at this time  BP Readings from Last 1 Encounters:  05/09/19 124/68   No changes needed Most recent labs reviewed  Disc lifstyle change with low sodium diet and exercise   Continues lisinopril and hctz  Enc her to keep working on better diet as well         Relevant Medications   amLODipine (NORVASC) 5 MG tablet     Endocrine   DM type 2 (diabetes mellitus, type 2) ([place])    Refilled lancets today Pt is eating less sweets  F/u in may

## 2019-05-09 NOTE — Assessment & Plan Note (Signed)
bp is improved with addition of amlodipine 5 mg  No side effects or problems  bp in fair control at this time  BP Readings from Last 1 Encounters:  05/09/19 124/68   No changes needed Most recent labs reviewed  Disc lifstyle change with low sodium diet and exercise   Continues lisinopril and hctz  Enc her to keep working on better diet as well

## 2019-05-09 NOTE — Patient Instructions (Signed)
Your blood pressure is better  Continue current medicines   Stay active  Eat a healthy diet

## 2019-05-09 NOTE — Assessment & Plan Note (Signed)
Refilled lancets today Pt is eating less sweets  F/u in may

## 2019-06-20 ENCOUNTER — Other Ambulatory Visit: Payer: Self-pay | Admitting: Family Medicine

## 2019-06-26 ENCOUNTER — Telehealth: Payer: Self-pay | Admitting: Family Medicine

## 2019-06-26 DIAGNOSIS — R7989 Other specified abnormal findings of blood chemistry: Secondary | ICD-10-CM

## 2019-06-26 DIAGNOSIS — E119 Type 2 diabetes mellitus without complications: Secondary | ICD-10-CM

## 2019-06-26 DIAGNOSIS — E785 Hyperlipidemia, unspecified: Secondary | ICD-10-CM

## 2019-06-26 DIAGNOSIS — I10 Essential (primary) hypertension: Secondary | ICD-10-CM

## 2019-06-26 DIAGNOSIS — E1169 Type 2 diabetes mellitus with other specified complication: Secondary | ICD-10-CM

## 2019-06-26 NOTE — Telephone Encounter (Signed)
-----   Message from Ellamae Sia sent at 06/14/2019  3:00 PM EDT ----- Regarding: Lab orders for Tuesday, 5.11.21 Patient is scheduled for CPX labs, please order future labs, Thanks , Karna Christmas

## 2019-06-27 ENCOUNTER — Other Ambulatory Visit: Payer: Self-pay

## 2019-06-27 ENCOUNTER — Other Ambulatory Visit (INDEPENDENT_AMBULATORY_CARE_PROVIDER_SITE_OTHER): Payer: Medicare Other

## 2019-06-27 DIAGNOSIS — E785 Hyperlipidemia, unspecified: Secondary | ICD-10-CM | POA: Diagnosis not present

## 2019-06-27 DIAGNOSIS — I10 Essential (primary) hypertension: Secondary | ICD-10-CM

## 2019-06-27 DIAGNOSIS — E119 Type 2 diabetes mellitus without complications: Secondary | ICD-10-CM

## 2019-06-27 DIAGNOSIS — R7989 Other specified abnormal findings of blood chemistry: Secondary | ICD-10-CM

## 2019-06-27 DIAGNOSIS — E1169 Type 2 diabetes mellitus with other specified complication: Secondary | ICD-10-CM | POA: Diagnosis not present

## 2019-06-27 LAB — CBC WITH DIFFERENTIAL/PLATELET
Basophils Absolute: 0 10*3/uL (ref 0.0–0.1)
Basophils Relative: 0.6 % (ref 0.0–3.0)
Eosinophils Absolute: 0.6 10*3/uL (ref 0.0–0.7)
Eosinophils Relative: 7.9 % — ABNORMAL HIGH (ref 0.0–5.0)
HCT: 36.8 % (ref 36.0–46.0)
Hemoglobin: 12.5 g/dL (ref 12.0–15.0)
Lymphocytes Relative: 30.2 % (ref 12.0–46.0)
Lymphs Abs: 2.3 10*3/uL (ref 0.7–4.0)
MCHC: 34 g/dL (ref 30.0–36.0)
MCV: 83.6 fl (ref 78.0–100.0)
Monocytes Absolute: 0.7 10*3/uL (ref 0.1–1.0)
Monocytes Relative: 8.9 % (ref 3.0–12.0)
Neutro Abs: 4 10*3/uL (ref 1.4–7.7)
Neutrophils Relative %: 52.4 % (ref 43.0–77.0)
Platelets: 291 10*3/uL (ref 150.0–400.0)
RBC: 4.4 Mil/uL (ref 3.87–5.11)
RDW: 14.2 % (ref 11.5–15.5)
WBC: 7.7 10*3/uL (ref 4.0–10.5)

## 2019-06-27 LAB — COMPREHENSIVE METABOLIC PANEL
ALT: 20 U/L (ref 0–35)
AST: 23 U/L (ref 0–37)
Albumin: 4.3 g/dL (ref 3.5–5.2)
Alkaline Phosphatase: 82 U/L (ref 39–117)
BUN: 15 mg/dL (ref 6–23)
CO2: 30 mEq/L (ref 19–32)
Calcium: 10.1 mg/dL (ref 8.4–10.5)
Chloride: 99 mEq/L (ref 96–112)
Creatinine, Ser: 0.97 mg/dL (ref 0.40–1.20)
GFR: 67.35 mL/min (ref >60.00)
Glucose, Bld: 124 mg/dL — ABNORMAL HIGH (ref 70–99)
Potassium: 3.6 mEq/L (ref 3.5–5.1)
Sodium: 139 mEq/L (ref 135–145)
Total Bilirubin: 0.4 mg/dL (ref 0.2–1.2)
Total Protein: 7.7 g/dL (ref 6.0–8.3)

## 2019-06-27 LAB — LIPID PANEL
Cholesterol: 182 mg/dL (ref 0–200)
HDL: 75.3 mg/dL (ref >39.00)
LDL Cholesterol: 87 mg/dL (ref 0–99)
NonHDL: 106.26
Total CHOL/HDL Ratio: 2
Triglycerides: 97 mg/dL (ref 0.0–149.0)
VLDL: 19.4 mg/dL (ref 0.0–40.0)

## 2019-06-27 LAB — TSH: TSH: 4.5 u[IU]/mL (ref 0.35–4.50)

## 2019-06-27 LAB — T4, FREE: Free T4: 0.86 ng/dL (ref 0.60–1.60)

## 2019-06-27 LAB — HEMOGLOBIN A1C: Hgb A1c MFr Bld: 7 % — ABNORMAL HIGH (ref 4.6–6.5)

## 2019-07-03 ENCOUNTER — Other Ambulatory Visit: Payer: Self-pay

## 2019-07-03 ENCOUNTER — Encounter: Payer: Self-pay | Admitting: Family Medicine

## 2019-07-03 ENCOUNTER — Ambulatory Visit (INDEPENDENT_AMBULATORY_CARE_PROVIDER_SITE_OTHER): Payer: Medicare Other | Admitting: Family Medicine

## 2019-07-03 VITALS — BP 124/68 | HR 78 | Temp 97.3°F | Ht 60.0 in | Wt 137.0 lb

## 2019-07-03 DIAGNOSIS — Z0000 Encounter for general adult medical examination without abnormal findings: Secondary | ICD-10-CM | POA: Diagnosis not present

## 2019-07-03 DIAGNOSIS — E119 Type 2 diabetes mellitus without complications: Secondary | ICD-10-CM | POA: Diagnosis not present

## 2019-07-03 DIAGNOSIS — R7989 Other specified abnormal findings of blood chemistry: Secondary | ICD-10-CM

## 2019-07-03 DIAGNOSIS — Z1211 Encounter for screening for malignant neoplasm of colon: Secondary | ICD-10-CM

## 2019-07-03 DIAGNOSIS — I10 Essential (primary) hypertension: Secondary | ICD-10-CM | POA: Diagnosis not present

## 2019-07-03 DIAGNOSIS — E1169 Type 2 diabetes mellitus with other specified complication: Secondary | ICD-10-CM | POA: Diagnosis not present

## 2019-07-03 DIAGNOSIS — E785 Hyperlipidemia, unspecified: Secondary | ICD-10-CM

## 2019-07-03 MED ORDER — METFORMIN HCL 500 MG PO TABS: 500.0000 mg | ORAL_TABLET | Freq: Two times a day (BID) | ORAL | 3 refills | Status: DC

## 2019-07-03 NOTE — Progress Notes (Signed)
Subjective:    Patient ID: [name], female    DOB: 1940-05-06, 79 y.o.   MRN: [id number]  This visit occurred during the SARS-CoV-2 public health emergency.  Safety protocols were in place, including screening questions prior to the visit, additional usage of staff PPE, and extensive cleaning of exam room while observing appropriate contact time as indicated for disinfecting solutions.    HPI  Pt presents for amw and health mt exam with review of chronic medical problems   I have personally reviewed the Medicare Annual Wellness questionnaire and have noted 1. The patient's medical and social history 2. Their use of alcohol, tobacco or illicit drugs 3. Their current medications and supplements 4. The patient's functional ability including ADL's, fall risks, home safety risks and hearing or visual             impairment. 5. Diet and physical activities 6. Evidence for depression or mood disorders  The patients weight, height, BMI have been recorded in the chart and visual acuity is per eye clinic.  I have made referrals, counseling and provided education to the patient based review of the above and I have provided the pt with a written personalized care plan for preventive services. Reviewed and updated provider list, see scanned forms.  See scanned forms.  Routine anticipatory guidance given to patient.  See health maintenance. Colon cancer screening -neg cologuard 5/18 (would like to do that again)  Breast cancer screening mammogram 6/20  (already has appt for next month)  Self breast exam-no lumpr or changes  Flu vaccine 9/20 Tetanus vaccine 5/15 Tdap Pneumovax completed  covid vaccines- 3/21 Zoster vaccine  zostavax 2016  Dexa 3/17 - FN bilat in the normal range  Falls- none  Fractures- none  Supplements- takes vit D  Exercise - yard work - and also walking at least 2 d per week   Advance directive- she has done this / her power of attorney are her daughters    Cognitive function addressed- see scanned forms- and if abnormal then additional documentation follows.  No problems at all with memory or cognition /handles her own affairs   PMH and SH reviewed  Meds, vitals, and allergies reviewed.   ROS: See HPI.  Otherwise negative.    Weight : Wt Readings from Last 3 Encounters:  07/03/19 137 lb (62.1 kg)  05/09/19 136 lb 11.2 oz (62 kg)  04/07/19 141 lb 5 oz (64.1 kg)  is getting more exercise  26.76 kg/m   Has been busy doing yard work   Hearing/vision:  [employer]   '125Hz'$  '250Hz'$  '500Hz'$  '1000Hz'$  '2000Hz'$  '3000Hz'$  '4000Hz'$  '6000Hz'$  '8000Hz'$   Right ear:           Left ear:             Visual Acuity Screening   Right eye Left eye Both eyes  Without correction: '20/20 20/40 20/25 '$  With correction:     today-was able to hear whisper at 15 ft (good hearing )  She report no hearing problems    HTN bp is stable today  No cp or palpitations or headaches or edema  No side effects to medicines  BP Readings from Last 3 Encounters:  07/03/19 124/68  05/09/19 124/68  04/07/19 (!) 160/80     Pulse Readings from Last 3 Encounters:  07/03/19 78  05/09/19 87  04/07/19 77    DM2 Lab Results  Component Value Date   HGBA1C 7.0 (H) 06/27/2019   eye exam  utd On ace and statin  This is up from 6.6  Has eaten differently - more fruit  Has eaten more bread /pasta and sweets      Lab Results  Component Value Date   CREATININE 0.97 06/27/2019   BUN 15 06/27/2019   NA 139 06/27/2019   K 3.6 06/27/2019   CL 99 06/27/2019   CO2 30 06/27/2019   Lab Results  Component Value Date   ALT 20 06/27/2019   AST 23 06/27/2019   ALKPHOS 82 06/27/2019   BILITOT 0.4 06/27/2019     Hyperlipidemia Lab Results  Component Value Date   CHOL 182 06/27/2019   CHOL 171 12/19/2018   CHOL 172 06/24/2018   Lab Results  Component Value Date   HDL 75.30 06/27/2019   HDL 75.20 12/19/2018   HDL 73.40 06/24/2018   Lab Results  Component Value Date    LDLCALC 87 06/27/2019   LDLCALC 81 12/19/2018   LDLCALC 72 06/24/2018   Lab Results  Component Value Date   TRIG 97.0 06/27/2019   TRIG 76.0 12/19/2018   TRIG 132.0 06/24/2018   Lab Results  Component Value Date   CHOLHDL 2 06/27/2019   CHOLHDL 2 12/19/2018   CHOLHDL 2 06/24/2018   Lab Results  Component Value Date   LDLDIRECT 111.2 02/28/2013   LDLDIRECT 122.4 02/23/2012   LDLDIRECT 110.3 08/24/2011   atorvastatin  Controlled  Diet is fair    Past h/o elevated tsh but nl this check  Lab Results  Component Value Date   TSH 4.50 06/27/2019    Nl FT4   Lab Results  Component Value Date   WBC 7.7 06/27/2019   HGB 12.5 06/27/2019   HCT 36.8 06/27/2019   MCV 83.6 06/27/2019   PLT 291.0 06/27/2019    Patient Active Problem List   Diagnosis Date Noted  . Belching 04/07/2019  . Low magnesium level 03/06/2019  . Low serum phosphorus for age 20/18/2021  . History of cholecystectomy 03/06/2019  . History of pancreatitis 02/21/2019  . Left hand paresthesia 12/27/2018  . Elevated TSH 06/28/2018  . Right leg pain 09/13/2015  . Routine general medical examination at a health care facility 04/05/2015  . Estrogen deficiency 04/05/2015  . Hypokalemia 09/04/2013  . Nonspecific abnormal electrocardiogram (ECG) (EKG) 08/30/2013  . Encounter for Medicare annual wellness exam 03/07/2013  . Fullness of supraclavicular fossa 02/27/2011  . Other screening mammogram 02/25/2011  . Gynecological examination 02/25/2011  . CERVICAL RADICULOPATHY, LEFT 03/31/2010  . DM type 2 (diabetes mellitus, type 2) ([place]) 07/06/2007  . FIBROIDS, UTERUS 03/23/2007  . Hyperlipidemia associated with type 2 diabetes mellitus ([place]) 03/23/2007  . Essential hypertension 03/23/2007  . POSTMENOPAUSAL STATUS 03/23/2007  . MURMUR 03/23/2007   Past Medical History:  Diagnosis Date  . Acute pancreatitis 02/22/2019  . 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 Fibroids   . Gallstones    incidental  asymptomatic gallstones  . Heart murmur   . Hyperlipidemia   . Hypertension    Past Surgical History:  Procedure Laterality Date  . CHOLECYSTECTOMY N/A 02/24/2019   Procedure: LAPAROSCOPIC CHOLECYSTECTOMY;  Surgeon: Ileana Roup, MD;  Location: [facility];  Service: General;  Laterality: N/A;  . NO PAST SURGERIES     Social History   Tobacco Use  . Smoking status: Never Smoker  . Smokeless tobacco: Never Used  Substance Use Topics  . Alcohol use: No    Alcohol/week: 0.0 standard drinks  . Drug use:  No   Family History  Problem Relation Age of Onset  . Hypertension Mother   . Diabetes Mother   . Osteoporosis Sister   . Diabetes Brother    Allergies  Allergen Reactions  . Ibuprofen Palpitations  . Sulfamethoxazole-Trimethoprim Rash   Current Outpatient Medications on File Prior to Visit  Medication Sig Dispense Refill  . ACCU-CHEK FASTCLIX LANCETS MISC Check glucose once daily (Dx. E11.9) 102 each 1  . Accu-Chek Softclix Lancets lancets To check glucose daily and as needed for diabetes type 2 100 each 5  . amLODipine (NORVASC) 5 MG tablet Take 1 tablet (5 mg total) by mouth daily. 90 tablet 3  . aspirin 81 MG tablet Take 81 mg by mouth daily.    Marland Kitchen atorvastatin (LIPITOR) 10 MG tablet TAKE 1 TABLET (10 MG TOTAL) BY MOUTH DAILY. IN EVENING 90 tablet 0  . Blood Glucose Monitoring Suppl (ACCU-CHEK AVIVA PLUS) w/Device KIT Use to check glucose once daily for DM 2 (Dx. E11.9) 1 kit 0  . cholecalciferol (VITAMIN D) 1000 UNITS tablet Take 1,000 Units by mouth daily.    Marland Kitchen glucose blood (ACCU-CHEK AVIVA PLUS) test strip Use to check blood sugar once daliy (dx. E11.9) 100 each 3  . hydrochlorothiazide (HYDRODIURIL) 25 MG tablet TAKE 1 TABLET BY MOUTH EVERY DAY IN THE MORNING 90 tablet 0  . KLOR-CON M20 20 MEQ tablet TAKE 1 TABLET BY MOUTH EVERY DAY 90 tablet 0  . lisinopril (ZESTRIL) 40 MG tablet TAKE 1 TABLET BY MOUTH EVERY DAY 90 tablet 0  . Multiple Vitamin (MULTIVITAMIN) tablet  Take 1 tablet by mouth daily.      Vladimir Faster Glycol-Propyl Glycol (SYSTANE FREE OP) Place 2 drops into both eyes daily.     No current facility-administered medications on file prior to visit.    Review of Systems  Constitutional: Negative for activity change, appetite change, fatigue, fever and unexpected weight change.  HENT: Negative for congestion, ear pain, rhinorrhea, sinus pressure and sore throat.   Eyes: Negative for pain, redness and visual disturbance.  Respiratory: Negative for cough, shortness of breath and wheezing.   Cardiovascular: Negative for chest pain and palpitations.  Gastrointestinal: Negative for abdominal pain, blood in stool, constipation and diarrhea.  Endocrine: Negative for polydipsia and polyuria.  Genitourinary: Negative for dysuria, frequency and urgency.  Musculoskeletal: Negative for arthralgias, back pain and myalgias.  Skin: Negative for pallor and rash.  Allergic/Immunologic: Negative for environmental allergies.  Neurological: Negative for dizziness, syncope and headaches.  Hematological: Negative for adenopathy. Does not bruise/bleed easily.  Psychiatric/Behavioral: Negative for decreased concentration and dysphoric mood. The patient is not nervous/anxious.        Objective:   Physical Exam Constitutional:      General: She is not in acute distress.    Appearance: Normal appearance. She is well-developed and normal weight. She is not ill-appearing or diaphoretic.  HENT:     Head: Normocephalic and atraumatic.     Right Ear: Tympanic membrane, ear canal and external ear normal.     Left Ear: Tympanic membrane, ear canal and external ear normal.     Nose: Nose normal. No congestion.     Mouth/Throat:     Mouth: Mucous membranes are moist.     Pharynx: Oropharynx is clear. No posterior oropharyngeal erythema.  Eyes:     General: No scleral icterus.    Extraocular Movements: Extraocular movements intact.     Conjunctiva/sclera: Conjunctivae  normal.     Pupils: Pupils  are equal, round, and reactive to light.  Neck:     Thyroid: No thyromegaly.     Vascular: No carotid bruit or JVD.  Cardiovascular:     Rate and Rhythm: Normal rate and regular rhythm.     Pulses: Normal pulses.     Heart sounds: Normal heart sounds. No gallop.   Pulmonary:     Effort: Pulmonary effort is normal. No respiratory distress.     Breath sounds: Normal breath sounds. No wheezing.     Comments: Good air exch Chest:     Chest wall: No tenderness.  Abdominal:     General: Bowel sounds are normal. There is no distension or abdominal bruit.     Palpations: Abdomen is soft. There is no mass.     Tenderness: There is no abdominal tenderness.     Hernia: No hernia is present.  Genitourinary:    Comments: Breast exam: No mass, nodules, thickening, tenderness, bulging, retraction, inflamation, nipple discharge or skin changes noted.  No axillary or clavicular LA.     Musculoskeletal:        General: No tenderness. Normal range of motion.     Cervical back: Normal range of motion and neck supple. No rigidity. No muscular tenderness.     Right lower leg: No edema.     Left lower leg: No edema.     Comments: No kyphosis or acute joint changes   Lymphadenopathy:     Cervical: No cervical adenopathy.  Skin:    General: Skin is warm and dry.     Coloration: Skin is not pale.     Findings: No erythema or rash.     Comments: Few sks on arms  Neurological:     Mental Status: She is alert. Mental status is at baseline.     Cranial Nerves: No cranial nerve deficit.     Motor: No abnormal muscle tone.     Coordination: Coordination normal.     Gait: Gait normal.     Deep Tendon Reflexes: Reflexes are normal and symmetric. Reflexes normal.  Psychiatric:        Mood and Affect: Mood normal.        Cognition and Memory: Cognition and memory normal.           Assessment & Plan:   Problem List Items Addressed This Visit      Cardiovascular and  Mediastinum   Essential hypertension    bp in fair control at this time  BP Readings from Last 1 Encounters:  07/03/19 124/68   No changes needed Most recent labs reviewed  Disc lifstyle change with low sodium diet and exercise          Endocrine   Hyperlipidemia associated with type 2 diabetes mellitus ([place])    Disc goals for lipids and reasons to control them Rev last labs with pt Rev low sat fat diet in detail Fair control with atorvastatin an ddiet        Relevant Medications   metFORMIN (GLUCOPHAGE) 500 MG tablet   DM type 2 (diabetes mellitus, type 2) ([facility])    A1C is up  Lab Results  Component Value Date   HGBA1C 7.0 (H) 06/27/2019   Plan to start metformin 500 mg bid as tolerated Disc poss side eff  Has eaten more processed carbs- counseled on this  Enc her to be active Taking statin and ace Nl foot exam  utd eye care F/u 73mo  Relevant Medications   metFORMIN (GLUCOPHAGE) 500 MG tablet     Other   Encounter for Medicare annual wellness exam - Primary    Reviewed health habits including diet and exercise and skin cancer prevention Reviewed appropriate screening tests for age  Also reviewed health mt list, fam hx and immunization status , as well as social and family history   See HPI Labs reviewed  Ordered cologuard (neg 3 y ago) Has had covid vaccines Last dexa normal-no falls or fractures Has advance directive done utd eye exam  No hearing problems  No cognitive concerns -very independent       Routine general medical examination at a health care facility    Reviewed health habits including diet and exercise and skin cancer prevention Reviewed appropriate screening tests for age  Also reviewed health mt list, fam hx and immunization status , as well as social and family history   See HPI Labs reviewed  Ordered cologuard (neg 3 y ago) Has had covid vaccines Last dexa normal-no falls or fractures Has advance directive done utd eye exam    No hearing problems  No cognitive concerns -very independent      Elevated TSH    In nl range this check Lab Results  Component Value Date   TSH 4.50 06/27/2019    Also nl FT4       Other Visit Diagnoses    Screening for colon cancer       Relevant Orders   Cologuard

## 2019-07-03 NOTE — Assessment & Plan Note (Signed)
Disc goals for lipids and reasons to control them Rev last labs with pt Rev low sat fat diet in detail Fair control with atorvastatin an ddiet

## 2019-07-03 NOTE — Assessment & Plan Note (Signed)
Reviewed health habits including diet and exercise and skin cancer prevention Reviewed appropriate screening tests for age  Also reviewed health mt list, fam hx and immunization status , as well as social and family history   See HPI Labs reviewed  Ordered cologuard (neg 3 y ago) Has had covid vaccines Last dexa normal-no falls or fractures Has advance directive done utd eye exam  No hearing problems  No cognitive concerns -very independent

## 2019-07-03 NOTE — Assessment & Plan Note (Signed)
In nl range this check Lab Results  Component Value Date   TSH 4.50 06/27/2019    Also nl FT4

## 2019-07-03 NOTE — Assessment & Plan Note (Signed)
A1C is up  Lab Results  Component Value Date   HGBA1C 7.0 (H) 06/27/2019   Plan to start metformin 500 mg bid as tolerated Disc poss side eff  Has eaten more processed carbs- counseled on this  Enc her to be active Taking statin and ace Nl foot exam  utd eye care F/u 11mo

## 2019-07-03 NOTE — Assessment & Plan Note (Signed)
bp in fair control at this time  BP Readings from Last 1 Encounters:  07/03/19 124/68   No changes needed Most recent labs reviewed  Disc lifstyle change with low sodium diet and exercise

## 2019-07-03 NOTE — Patient Instructions (Addendum)
Get started with metformin 500 mg twice daily  If any problems or side effects let us know   Follow up in 3 months   Try to get most of your carbohydrates from produce (with the exception of white potatoes)  Eat less bread/pasta/rice/snack foods/cereals/sweets and other items from the middle of the grocery store (processed carbs)   I ordered the cologuard kit for colon cancer screening

## 2019-07-17 ENCOUNTER — Other Ambulatory Visit: Payer: Self-pay | Admitting: Family Medicine

## 2019-07-19 ENCOUNTER — Telehealth: Payer: Self-pay | Admitting: Family Medicine

## 2019-07-19 NOTE — Progress Notes (Signed)
  Chronic Care Management   Outreach Note  07/19/2019 Name: [name] MRN: [id number] DOB: 10-18-1940  Referred by: Tower, Wynelle Fanny, MD Reason for referral : No chief complaint on file.   An unsuccessful telephone outreach was attempted today. The patient was referred to the pharmacist for assistance with care management and care coordination.   This note is not being shared with the patient for the following reason: To respect privacy (The patient or proxy has requested that the information not be shared).  Follow Up Plan:   Earney Hamburg Upstream Scheduler

## 2019-07-20 ENCOUNTER — Telehealth: Payer: Self-pay | Admitting: Family Medicine

## 2019-07-20 NOTE — Progress Notes (Signed)
  Chronic Care Management   Outreach Note  07/20/2019 Name: [name] MRN: [id number] DOB: 1940-09-08  Referred by: Tower, Wynelle Fanny, MD Reason for referral : Chronic Care Management (INITIAL CCM OUTREACH)    Chronic Care Management   Note  07/20/2019 Name: [name] MRN: [id number] DOB: 1941-02-15  [name] is a 79 y.o. year old female who is a primary care patient of Tower, Wynelle Fanny, MD. I reached out to [name] by phone today in response to a referral sent by Ms. Baudelia Broome's PCP, Tower, Wynelle Fanny, MD.   Ms. Tiernan was given information about Chronic Care Management services today including:  1. CCM service includes personalized support from designated clinical staff supervised by her physician, including individualized plan of care and coordination with other care providers 2. 24/7 contact phone numbers for assistance for urgent and routine care needs. 3. Service will only be billed when office clinical staff spend 20 minutes or more in a month to coordinate care. 4. Only one practitioner may furnish and bill the service in a calendar month. 5. The patient may stop CCM services at any time (effective at the end of the month) by phone call to the office staff.   Patient agreed to services and verbal consent obtained.   This note is not being shared with the patient for the following reason: To respect privacy (The patient or proxy has requested that the information not be shared).  Follow up plan:    Earney Hamburg Upstream Scheduler  Follow Up Plan:   SIGNATURE

## 2019-07-24 ENCOUNTER — Encounter: Payer: Self-pay | Admitting: Family Medicine

## 2019-07-24 DIAGNOSIS — Z1231 Encounter for screening mammogram for malignant neoplasm of breast: Secondary | ICD-10-CM | POA: Diagnosis not present

## 2019-07-25 DIAGNOSIS — Z1211 Encounter for screening for malignant neoplasm of colon: Secondary | ICD-10-CM | POA: Diagnosis not present

## 2019-07-25 DIAGNOSIS — Z1212 Encounter for screening for malignant neoplasm of rectum: Secondary | ICD-10-CM | POA: Diagnosis not present

## 2019-07-28 ENCOUNTER — Other Ambulatory Visit: Payer: Self-pay

## 2019-07-28 ENCOUNTER — Telehealth: Payer: Self-pay

## 2019-07-28 ENCOUNTER — Encounter: Payer: Self-pay | Admitting: Family Medicine

## 2019-07-28 ENCOUNTER — Ambulatory Visit (INDEPENDENT_AMBULATORY_CARE_PROVIDER_SITE_OTHER): Payer: Medicare Other | Admitting: Family Medicine

## 2019-07-28 ENCOUNTER — Ambulatory Visit (INDEPENDENT_AMBULATORY_CARE_PROVIDER_SITE_OTHER)
Admission: RE | Admit: 2019-07-28 | Discharge: 2019-07-28 | Disposition: A | Discharge status: Home / Self Care | Payer: Medicare Other | Source: Ambulatory Visit | Attending: Family Medicine | Admitting: Family Medicine

## 2019-07-28 VITALS — BP 126/78 | HR 77 | Temp 97.5°F | Ht 60.0 in | Wt 135.1 lb

## 2019-07-28 DIAGNOSIS — E119 Type 2 diabetes mellitus without complications: Secondary | ICD-10-CM | POA: Diagnosis not present

## 2019-07-28 DIAGNOSIS — R202 Paresthesia of skin: Secondary | ICD-10-CM | POA: Diagnosis not present

## 2019-07-28 DIAGNOSIS — M5412 Radiculopathy, cervical region: Secondary | ICD-10-CM

## 2019-07-28 DIAGNOSIS — M47812 Spondylosis without myelopathy or radiculopathy, cervical region: Secondary | ICD-10-CM | POA: Diagnosis not present

## 2019-07-28 DIAGNOSIS — M542 Cervicalgia: Secondary | ICD-10-CM | POA: Diagnosis not present

## 2019-07-28 LAB — COLOGUARD
COLOGUARD: NEGATIVE
Cologuard: NEGATIVE

## 2019-07-28 MED ORDER — GABAPENTIN 100 MG PO CAPS: 100.0000 mg | ORAL_CAPSULE | Freq: Every day | ORAL | 3 refills | Status: DC

## 2019-07-28 MED ORDER — METFORMIN HCL ER 500 MG PO TB24: 1000.0000 mg | ORAL_TABLET | Freq: Every day | ORAL | 5 refills | Status: DC

## 2019-07-28 NOTE — Telephone Encounter (Addendum)
Pt said since 07/23/19 pt has had lt shoulder and upper arm aching and numbness on and off. No CP,SOB,H/A or dizziness. Pt arm is functioning appropriately and pt is using arm normally. Pt is not resting at night due to arm aching and pain level now is 8-9. Pt does not want to go to UC or ED. Tylenol and ice is not helping.Pt scheduled in office 30' appt today at 12 noon. UC & ED precautions given and pt voiced understanding.

## 2019-07-28 NOTE — Assessment & Plan Note (Signed)
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

## 2019-07-28 NOTE — Patient Instructions (Signed)
Hold the metformin you have  I want to change to the extended release formula (ER or XR)  Take two of the metformin ER 500 mg pills once daily   This should help the GI side effects If not - let me know    I think your arm pain and tingling may be due to your neck (pinched nerves)  Xray now - we will have a reading on Monday  Continue ice You can try voltaren gel over the counter on your neck/arm  as directed Also try the gabapentin 1-2 pills at bedtime

## 2019-07-28 NOTE — Progress Notes (Signed)
Subjective:    Patient ID: [name], female    DOB: 03/21/1940, 79 y.o.   MRN: [id number]  This visit occurred during the SARS-CoV-2 public health emergency.  Safety protocols were in place, including screening questions prior to the visit, additional usage of staff PPE, and extensive cleaning of exam room while observing appropriate contact time as indicated for disinfecting solutions.    HPI Pt presents for L arm pain and numbness   Also DM- metformin is causing GI problems    Wt Readings from Last 3 Encounters:  07/28/19 135 lb 1 oz (61.3 kg)  07/03/19 137 lb (62.1 kg)  05/09/19 136 lb 11.2 oz (62 kg)   26.38 kg/m   Symptoms have gone on for a while  Worse on Sunday Took tylenol   Posterior shoulder/upper arm and trapezius is the wost  Dull ache  L hand is tingly on and off /occ up her arm  Hurts to lie on the L side (shoulder)  Nothing it swollen  No weakness   A little pain in middle of neck   Does use ice    Of note- cervical radiculopathy L is in her problem list from 2012 Also L hand parethesia (? 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 This is  causing significant spinal stenosis. I suspect there may be some  cord compression at C4-5 and C5-6 due to spondylosis. There is  also ossification of the posterior longitudinal ligament in the  upper cervical spine. Neurologic evaluation is suggested for  myelopathy.    Lab Results  Component Value Date   HGBA1C 7.0 (H) 06/27/2019   Metformin was started in may at 500 mg bid - gives a lot of gas   Patient Active Problem List   Diagnosis Date Noted  . Belching 04/07/2019  . Low magnesium level 03/06/2019  . Low serum phosphorus for age 60/18/2021  . History of cholecystectomy 03/06/2019  . History of pancreatitis 02/21/2019  . Left  hand paresthesia 12/27/2018  . Elevated TSH 06/28/2018  . Right leg pain 09/13/2015  . Routine general medical examination at a health care facility 04/05/2015  . Estrogen deficiency 04/05/2015  . Hypokalemia 09/04/2013  . Nonspecific abnormal electrocardiogram (ECG) (EKG) 08/30/2013  . Encounter for Medicare annual wellness exam 03/07/2013  . Fullness of supraclavicular fossa 02/27/2011  . Other screening mammogram 02/25/2011  . Gynecological examination 02/25/2011  . Cervical radiculopathy 03/31/2010  . DM type 2 (diabetes mellitus, type 2) ([place]) 07/06/2007  . FIBROIDS, UTERUS 03/23/2007  . Hyperlipidemia associated with type 2 diabetes mellitus ([place]) 03/23/2007  . Essential hypertension 03/23/2007  . POSTMENOPAUSAL STATUS 03/23/2007  . MURMUR 03/23/2007   Past Medical History:  Diagnosis Date  . Acute pancreatitis 02/22/2019  . 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 Fibroids   . Gallstones    incidental asymptomatic gallstones  . Heart murmur   . Hyperlipidemia   . Hypertension    Past Surgical History:  Procedure Laterality Date  . CHOLECYSTECTOMY N/A 02/24/2019   Procedure: LAPAROSCOPIC CHOLECYSTECTOMY;  Surgeon: Ileana Roup, MD;  Location: [facility];  Service: General;  Laterality: N/A;  . NO PAST SURGERIES     Social History   Tobacco Use  . Smoking status: Never Smoker  . Smokeless tobacco: Never Used  Vaping Use  .  Vaping Use: Never used  Substance Use Topics  . Alcohol use: No    Alcohol/week: 0.0 standard drinks  . Drug use: No   Family History  Problem Relation Age of Onset  . Hypertension Mother   . Diabetes Mother   . Osteoporosis Sister   . Diabetes Brother    Allergies  Allergen Reactions  . Ibuprofen Palpitations  . Sulfamethoxazole-Trimethoprim Rash   Current Outpatient Medications on File Prior to Visit  Medication Sig Dispense Refill  . ACCU-CHEK AVIVA PLUS test strip USE TO CHECK BLOOD SUGAR ONCE DALIY (DX. E11.9) 50 strip 2  .  ACCU-CHEK FASTCLIX LANCETS MISC Check glucose once daily (Dx. E11.9) 102 each 1  . Accu-Chek Softclix Lancets lancets To check glucose daily and as needed for diabetes type 2 100 each 5  . amLODipine (NORVASC) 5 MG tablet Take 1 tablet (5 mg total) by mouth daily. 90 tablet 3  . aspirin 81 MG tablet Take 81 mg by mouth daily.    Marland Kitchen atorvastatin (LIPITOR) 10 MG tablet TAKE 1 TABLET (10 MG TOTAL) BY MOUTH DAILY. IN EVENING 90 tablet 0  . Blood Glucose Monitoring Suppl (ACCU-CHEK AVIVA PLUS) w/Device KIT Use to check glucose once daily for DM 2 (Dx. E11.9) 1 kit 0  . cholecalciferol (VITAMIN D) 1000 UNITS tablet Take 1,000 Units by mouth daily.    . hydrochlorothiazide (HYDRODIURIL) 25 MG tablet TAKE 1 TABLET BY MOUTH EVERY DAY IN THE MORNING 90 tablet 0  . KLOR-CON M20 20 MEQ tablet TAKE 1 TABLET BY MOUTH EVERY DAY 90 tablet 0  . lisinopril (ZESTRIL) 40 MG tablet TAKE 1 TABLET BY MOUTH EVERY DAY 90 tablet 0  . Multiple Vitamin (MULTIVITAMIN) tablet Take 1 tablet by mouth daily.      Vladimir Faster Glycol-Propyl Glycol (SYSTANE FREE OP) Place 2 drops into both eyes daily.     No current facility-administered medications on file prior to visit.      Review of Systems  Constitutional: Negative for activity change, appetite change, fatigue, fever and unexpected weight change.  HENT: Negative for congestion, ear pain, rhinorrhea, sinus pressure and sore throat.   Eyes: Negative for pain, redness and visual disturbance.  Respiratory: Negative for cough, shortness of breath and wheezing.   Cardiovascular: Negative for chest pain and palpitations.  Gastrointestinal: Negative for abdominal pain, blood in stool, constipation and diarrhea.  Endocrine: Negative for polydipsia and polyuria.  Genitourinary: Negative for dysuria, frequency and urgency.  Musculoskeletal: Positive for neck pain and neck stiffness. Negative for arthralgias, back pain and myalgias.  Skin: Negative for pallor and rash.    Allergic/Immunologic: Negative for environmental allergies.  Neurological: Negative for dizziness, syncope and headaches.       Tingling in L arm and hand intermittent  Hematological: Negative for adenopathy. Does not bruise/bleed easily.  Psychiatric/Behavioral: Negative for decreased concentration and dysphoric mood. The patient is not nervous/anxious.        Objective:   Physical Exam Constitutional:      General: She is not in acute distress.    Appearance: Normal appearance. She is normal weight. She is not ill-appearing.  HENT:     Head: Normocephalic and atraumatic.  Eyes:     Extraocular Movements: Extraocular movements intact.     Conjunctiva/sclera: Conjunctivae normal.     Pupils: Pupils are equal, round, and reactive to light.  Neck:     Vascular: No carotid bruit.  Cardiovascular:     Rate and Rhythm: Normal rate and  regular rhythm.     Pulses: Normal pulses.  Pulmonary:     Effort: Pulmonary effort is normal. No respiratory distress.     Breath sounds: Normal breath sounds. No wheezing.  Musculoskeletal:        General: Tenderness present. No swelling.     Cervical back: Normal range of motion and neck supple. Spasms, tenderness and bony tenderness present. No swelling, deformity, rigidity, torticollis or crepitus. Pain with movement present. Normal range of motion.     Right lower leg: No edema.     Left lower leg: No edema.     Comments: L cervical musculature is tender and in spasm  Also L trap muscle Tender CS processes -lower  Nl rom of L shoulder  No joint swelling  Nl rom wrist and hand No neuro changes   Lymphadenopathy:     Cervical: No cervical adenopathy.  Skin:    General: Skin is warm and dry.     Coloration: Skin is not pale.     Findings: No erythema or rash.  Neurological:     Mental Status: She is alert.     Cranial Nerves: No cranial nerve deficit.     Sensory: No sensory deficit.     Motor: No weakness.     Coordination:  Coordination normal.     Deep Tendon Reflexes: Reflexes normal.     Comments: Pt does not have tingling during the visit today  Psychiatric:        Mood and Affect: Mood normal.           Assessment & Plan:   Problem List Items Addressed This Visit      Endocrine   DM type 2 (diabetes mellitus, type 2) ([place])    Metformin causes GI side eff/gas  Will try the xr and see if this improves      Relevant Medications   metFORMIN (GLUCOPHAGE-XR) 500 MG 24 hr tablet     Nervous and Auditory   Cervical radiculopathy - Primary    With worsening L arm and neck pain  Rev last CT indicating ligament ossification and spondylosis caused significant spinal stenosis (C4-5 and C5-6) No weakness or acute sensory changes on exam  Tenderness and spasm noted Px gabapentin to try 100 to 200 mg at bedtime (when she is most uncomfortable)  Ice is ok  Suggested otc voltaren gel  CS xray-pend rad rev for further plan       Relevant Medications   gabapentin (NEURONTIN) 100 MG capsule   Other Relevant Orders   DG Cervical Spine Complete     Other   Left hand paresthesia    No worse  ? If from cervical radiculopathy (xr pend) vs cts      Relevant Orders   DG Cervical Spine Complete

## 2019-07-28 NOTE — Assessment & Plan Note (Signed)
Metformin causes GI side eff/gas  Will try the xr and see if this improves

## 2019-07-28 NOTE — Assessment & Plan Note (Signed)
No worse  ? If from cervical radiculopathy (xr pend) vs cts

## 2019-07-28 NOTE — Telephone Encounter (Signed)
Aware, will see her then 

## 2019-07-31 ENCOUNTER — Telehealth: Payer: Self-pay | Admitting: Family Medicine

## 2019-07-31 ENCOUNTER — Encounter: Payer: Self-pay | Admitting: Family Medicine

## 2019-07-31 DIAGNOSIS — M5412 Radiculopathy, cervical region: Secondary | ICD-10-CM

## 2019-07-31 NOTE — Telephone Encounter (Signed)
-----   Message from [name], [place] sent at 07/31/2019  4:15 PM EDT ----- Pt notified of xray results. She also viewed them on mychart. Pt agrees with Ortho referral, she would like to see someone in [place] if possible, I advised pt our [facility] will call to schedule appt

## 2019-07-31 NOTE — Telephone Encounter (Signed)
Ref done Will route to [facility]  

## 2019-08-15 DIAGNOSIS — M542 Cervicalgia: Secondary | ICD-10-CM | POA: Diagnosis not present

## 2019-08-22 DIAGNOSIS — M542 Cervicalgia: Secondary | ICD-10-CM | POA: Diagnosis not present

## 2019-08-25 DIAGNOSIS — G959 Disease of spinal cord, unspecified: Secondary | ICD-10-CM | POA: Diagnosis not present

## 2019-08-28 ENCOUNTER — Other Ambulatory Visit: Payer: Self-pay | Admitting: Orthopedic Surgery

## 2019-08-30 ENCOUNTER — Telehealth: Payer: Self-pay | Admitting: *Deleted

## 2019-08-30 NOTE — Telephone Encounter (Signed)
Left VM requesting pt to call the office back and schedule a surgical clearance appt.   We received forms from [place] requesting [place] to clear her for surgery but we can't until she comes in for an appt/EKG

## 2019-08-30 NOTE — Telephone Encounter (Signed)
Pt called back and scheduled a surgical clearance appt

## 2019-09-05 ENCOUNTER — Ambulatory Visit (INDEPENDENT_AMBULATORY_CARE_PROVIDER_SITE_OTHER): Payer: Medicare Other | Admitting: Family Medicine

## 2019-09-05 ENCOUNTER — Encounter: Payer: Self-pay | Admitting: Family Medicine

## 2019-09-05 ENCOUNTER — Other Ambulatory Visit: Payer: Self-pay

## 2019-09-05 VITALS — BP 126/70 | HR 67 | Temp 97.1°F | Ht 60.0 in | Wt 132.1 lb

## 2019-09-05 DIAGNOSIS — Z01810 Encounter for preprocedural cardiovascular examination: Secondary | ICD-10-CM | POA: Insufficient documentation

## 2019-09-05 DIAGNOSIS — E119 Type 2 diabetes mellitus without complications: Secondary | ICD-10-CM

## 2019-09-05 DIAGNOSIS — I10 Essential (primary) hypertension: Secondary | ICD-10-CM | POA: Diagnosis not present

## 2019-09-05 DIAGNOSIS — M5412 Radiculopathy, cervical region: Secondary | ICD-10-CM

## 2019-09-05 DIAGNOSIS — R9431 Abnormal electrocardiogram [ECG] [EKG]: Secondary | ICD-10-CM

## 2019-09-05 NOTE — Assessment & Plan Note (Signed)
Stable EKG and cardiac status  Rev old echo No M on exam today  No lung problems  Rev drug sensitivities Will plan to hold asa a week before surgery  No problems with anesthesia in the past (last surg ccy in January)  Adv watching glucose in light of DM  No restrictions for surgery

## 2019-09-05 NOTE — Assessment & Plan Note (Signed)
No changes on ekg today  Baseline voltage criteria for LVH

## 2019-09-05 NOTE — Assessment & Plan Note (Signed)
For upcoming surgery/fusion with Dr Othelia Pulling

## 2019-09-05 NOTE — Assessment & Plan Note (Signed)
No M heard today

## 2019-09-05 NOTE — Patient Instructions (Addendum)
Hold your aspirin for a week before surgery   Blood pressure and pulse and ekg are stable  I hope your surgery goes well  Keep watching your blood glucose levels and watch diet for diabetes   I will fax in your paperwork

## 2019-09-05 NOTE — Assessment & Plan Note (Signed)
bp in fair control at this time  BP Readings from Last 1 Encounters:  09/05/19 126/70   No changes needed Most recent labs reviewed  Disc lifstyle change with low sodium diet and exercise

## 2019-09-05 NOTE — Progress Notes (Signed)
Subjective:    Patient ID: [name], female    DOB: 1940-06-01, 79 y.o.   MRN: [id number]  This visit occurred during the SARS-CoV-2 public health emergency.  Safety protocols were in place, including screening questions prior to the visit, additional usage of staff PPE, and extensive cleaning of exam room while observing appropriate contact time as indicated for disinfecting solutions.    HPI Pt presents for pre operative exam/clearance for upcoming cervical decompression/fusion planned on 09/21/19 with Dr Zenaida Deed Readings from Last 3 Encounters:  09/05/19 132 lb 1 oz (59.9 kg)  07/28/19 135 lb 1 oz (61.3 kg)  07/03/19 137 lb (62.1 kg)   25.79 kg/m   Wants me to check a "place" on her L leg  Bug bite she suspects  Itchy  Works out in the garden    Suspects general anesthesia  Had gen anesth in [place] for ccy   Here symptoms are not too bad right now  Has not had to take pain medicine too often    HTN bp is stable today  No cp or palpitations or headaches or edema  No side effects to medicines  BP Readings from Last 3 Encounters:  09/05/19 126/70  07/28/19 126/78  07/03/19 124/68     Amlodipine hctz K Lisinopril   Pulse Readings from Last 3 Encounters:  09/05/19 67  07/28/19 77  07/03/19 78     DM2 Metformin xr- tolerates this better than the short acting  Lab Results  Component Value Date   HGBA1C 7.0 (H) 06/27/2019  this was up a bit  Watching her diet   She has hx of signs of LVH on her EKG in the past as well as a murmur Echocardiogram in 2015 -moderate concentric hypertrophy with nl EF and no valve issues   EKG shows rate of 66 with NSR  Voltage criteria for LVH (baseline) today   No chest pain  No sob  No problems with her last surgery  No problems with anesthesia  No allergies to tape or latex    She had ccy in 1/21 with no anesthesia problems   She is allergic to sulfa and intol to ibuprofen  Takes asa 81 mg daily   Lab  Results  Component Value Date   CHOL 182 06/27/2019   HDL 75.30 06/27/2019   LDLCALC 87 06/27/2019   LDLDIRECT 111.2 02/28/2013   TRIG 97.0 06/27/2019   CHOLHDL 2 06/27/2019    Patient Active Problem List   Diagnosis Date Noted  . Pre-operative cardiovascular examination 09/05/2019  . Belching 04/07/2019  . History of cholecystectomy 03/06/2019  . History of pancreatitis 02/21/2019  . Left hand paresthesia 12/27/2018  . Elevated TSH 06/28/2018  . Right leg pain 09/13/2015  . Routine general medical examination at a health care facility 04/05/2015  . Estrogen deficiency 04/05/2015  . Hypokalemia 09/04/2013  . Nonspecific abnormal electrocardiogram (ECG) (EKG) 08/30/2013  . Encounter for Medicare annual wellness exam 03/07/2013  . Fullness of supraclavicular fossa 02/27/2011  . Other screening mammogram 02/25/2011  . Gynecological examination 02/25/2011  . Cervical radiculopathy 03/31/2010  . DM type 2 (diabetes mellitus, type 2) ([place]) 07/06/2007  . FIBROIDS, UTERUS 03/23/2007  . Hyperlipidemia associated with type 2 diabetes mellitus ([place]) 03/23/2007  . Essential hypertension 03/23/2007  . POSTMENOPAUSAL STATUS 03/23/2007   Past Medical History:  Diagnosis Date  . Acute pancreatitis 02/22/2019  . 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 Fibroids   .  Gallstones    incidental asymptomatic gallstones  . Heart murmur   . Hyperlipidemia   . Hypertension    Past Surgical History:  Procedure Laterality Date  . CHOLECYSTECTOMY N/A 02/24/2019   Procedure: LAPAROSCOPIC CHOLECYSTECTOMY;  Surgeon: Ileana Roup, MD;  Location: [facility];  Service: General;  Laterality: N/A;  . NO PAST SURGERIES     Social History   Tobacco Use  . Smoking status: Never Smoker  . Smokeless tobacco: Never Used  Vaping Use  . Vaping Use: Never used  Substance Use Topics  . Alcohol use: No    Alcohol/week: 0.0 standard drinks  . Drug use: No   Family History  Problem Relation Age of Onset    . Hypertension Mother   . Diabetes Mother   . Osteoporosis Sister   . Diabetes Brother    Allergies  Allergen Reactions  . Ibuprofen Palpitations  . Sulfamethoxazole-Trimethoprim Rash   Current Outpatient Medications on File Prior to Visit  Medication Sig Dispense Refill  . ACCU-CHEK AVIVA PLUS test strip USE TO CHECK BLOOD SUGAR ONCE DALIY (DX. E11.9) 50 strip 2  . ACCU-CHEK FASTCLIX LANCETS MISC Check glucose once daily (Dx. E11.9) 102 each 1  . Accu-Chek Softclix Lancets lancets To check glucose daily and as needed for diabetes type 2 100 each 5  . amLODipine (NORVASC) 5 MG tablet Take 1 tablet (5 mg total) by mouth daily. 90 tablet 3  . aspirin 81 MG tablet Take 81 mg by mouth daily.    Marland Kitchen atorvastatin (LIPITOR) 10 MG tablet TAKE 1 TABLET (10 MG TOTAL) BY MOUTH DAILY. IN EVENING 90 tablet 0  . Blood Glucose Monitoring Suppl (ACCU-CHEK AVIVA PLUS) w/Device KIT Use to check glucose once daily for DM 2 (Dx. E11.9) 1 kit 0  . cholecalciferol (VITAMIN D) 1000 UNITS tablet Take 1,000 Units by mouth daily.    Marland Kitchen gabapentin (NEURONTIN) 100 MG capsule Take 1-2 capsules (100-200 mg total) by mouth at bedtime. As needed for neck and arm pain 60 capsule 3  . hydrochlorothiazide (HYDRODIURIL) 25 MG tablet TAKE 1 TABLET BY MOUTH EVERY DAY IN THE MORNING 90 tablet 0  . KLOR-CON M20 20 MEQ tablet TAKE 1 TABLET BY MOUTH EVERY DAY 90 tablet 0  . lisinopril (ZESTRIL) 40 MG tablet TAKE 1 TABLET BY MOUTH EVERY DAY 90 tablet 0  . metFORMIN (GLUCOPHAGE-XR) 500 MG 24 hr tablet Take 2 tablets (1,000 mg total) by mouth daily with breakfast. 60 tablet 5  . Multiple Vitamin (MULTIVITAMIN) tablet Take 1 tablet by mouth daily.      Vladimir Faster Glycol-Propyl Glycol (SYSTANE FREE OP) Place 2 drops into both eyes daily.     No current facility-administered medications on file prior to visit.     Review of Systems  Constitutional: Negative for activity change, appetite change, fatigue, fever and unexpected weight  change.  HENT: Negative for congestion, ear pain, rhinorrhea, sinus pressure and sore throat.   Eyes: Negative for pain, redness and visual disturbance.  Respiratory: Negative for cough, shortness of breath and wheezing.   Cardiovascular: Negative for chest pain and palpitations.  Gastrointestinal: Negative for abdominal pain, blood in stool, constipation and diarrhea.  Endocrine: Negative for polydipsia and polyuria.  Genitourinary: Negative for dysuria, frequency and urgency.  Musculoskeletal: Positive for neck pain. Negative for arthralgias, back pain and myalgias.  Skin: Negative for pallor and rash.  Allergic/Immunologic: Negative for environmental allergies.  Neurological: Positive for numbness. Negative for dizziness, syncope and headaches.  Hematological:  Negative for adenopathy. Does not bruise/bleed easily.  Psychiatric/Behavioral: Negative for decreased concentration and dysphoric mood. The patient is not nervous/anxious.        Objective:   Physical Exam Constitutional:      General: She is not in acute distress.    Appearance: Normal appearance. She is well-developed and normal weight. She is not ill-appearing or diaphoretic.  HENT:     Head: Normocephalic and atraumatic.  Eyes:     Conjunctiva/sclera: Conjunctivae normal.     Pupils: Pupils are equal, round, and reactive to light.  Neck:     Thyroid: No thyromegaly.     Vascular: No carotid bruit or JVD.  Cardiovascular:     Rate and Rhythm: Normal rate and regular rhythm.     Heart sounds: Normal heart sounds. No gallop.   Pulmonary:     Effort: Pulmonary effort is normal. No respiratory distress.     Breath sounds: Normal breath sounds. No wheezing or rales.  Abdominal:     General: Bowel sounds are normal. There is no distension or abdominal bruit.     Palpations: Abdomen is soft. There is no mass.     Tenderness: There is no abdominal tenderness.     Hernia: No hernia is present.  Musculoskeletal:      Cervical back: Normal range of motion and neck supple.     Right lower leg: No edema.     Left lower leg: No edema.  Lymphadenopathy:     Cervical: No cervical adenopathy.  Skin:    General: Skin is warm and dry.     Coloration: Skin is not pale.     Findings: No erythema or rash.  Neurological:     Mental Status: She is alert.     Coordination: Coordination normal.     Deep Tendon Reflexes: Reflexes are normal and symmetric.  Psychiatric:        Mood and Affect: Mood normal.           Assessment & Plan:   Problem List Items Addressed This Visit      Cardiovascular and Mediastinum   Essential hypertension    bp in fair control at this time  BP Readings from Last 1 Encounters:  09/05/19 126/70   No changes needed Most recent labs reviewed  Disc lifstyle change with low sodium diet and exercise          Endocrine   DM type 2 (diabetes mellitus, type 2) ([place])    Lab Results  Component Value Date   HGBA1C 7.0 (H) 06/27/2019   Aware for upcoming surgery  Taking metformin -will likely have to hold day of surgery  No low glucose episodes        Nervous and Auditory   Cervical radiculopathy    For upcoming surgery/fusion with Dr Othelia Pulling        Other   Nonspecific abnormal electrocardiogram (ECG) (EKG)    No changes on ekg today  Baseline voltage criteria for LVH       Pre-operative cardiovascular examination - Primary    Stable EKG and cardiac status  Rev old echo No M on exam today  No lung problems  Rev drug sensitivities Will plan to hold asa a week before surgery  No problems with anesthesia in the past (last surg ccy in January)  Adv watching glucose in light of DM  No restrictions for surgery       Relevant Orders   EKG 12-Lead (Completed)  RESOLVED: MURMUR    No M heard today

## 2019-09-05 NOTE — Assessment & Plan Note (Signed)
Lab Results  Component Value Date   HGBA1C 7.0 (H) 06/27/2019   Aware for upcoming surgery  Taking metformin -will likely have to hold day of surgery  No low glucose episodes

## 2019-09-06 ENCOUNTER — Telehealth: Payer: Self-pay

## 2019-09-06 ENCOUNTER — Telehealth: Payer: Medicare Other

## 2019-09-06 NOTE — Chronic Care Management (AMB) (Deleted)
Chronic Care Management Pharmacy  Name: Shamica Moree  MRN: [id number] DOB: 1940/11/08  Chief Complaint/ HPI  [name],  79 y.o. , female presents for their Initial CCM visit with the clinical pharmacist via telephone.  PCP : Abner Greenspan, MD  Their chronic conditions include: HTN, HLD, type 2 DM, nerve pain  6/3 - CCM consent  Office Visits:  09/06/19: PCP visit - HTN stable, watch glucose levels, tolerating metformin XR better, hold aspirin for 1 week prior to surgery  07/28/19: PCP vidit - DM, metformin causing GI side effects/gas, try XR; cervical radiculopathy - worsening left arm and neck pain, try gabapentin 100-200 mg qhs, OTC voltaren gel  07/03/19: AWV - DM a1c 7%, start metformin 500 mg BID, on statin and ACE-I, watch carbs  Consult Visit:***  Medications: Outpatient Encounter Medications as of 09/06/2019  Medication Sig  . ACCU-CHEK AVIVA PLUS test strip USE TO CHECK BLOOD SUGAR ONCE DALIY (DX. E11.9)  . ACCU-CHEK FASTCLIX LANCETS MISC Check glucose once daily (Dx. E11.9)  . Accu-Chek Softclix Lancets lancets To check glucose daily and as needed for diabetes type 2  . amLODipine (NORVASC) 5 MG tablet Take 1 tablet (5 mg total) by mouth daily.  Marland Kitchen aspirin 81 MG tablet Take 81 mg by mouth daily.  Marland Kitchen atorvastatin (LIPITOR) 10 MG tablet TAKE 1 TABLET (10 MG TOTAL) BY MOUTH DAILY. IN [place]  . Blood Glucose Monitoring Suppl (ACCU-CHEK AVIVA PLUS) w/Device KIT Use to check glucose once daily for DM 2 (Dx. E11.9)  . cholecalciferol (VITAMIN D) 1000 UNITS tablet Take 1,000 Units by mouth daily.  Marland Kitchen gabapentin (NEURONTIN) 100 MG capsule Take 1-2 capsules (100-200 mg total) by mouth at bedtime. As needed for neck and arm pain  . hydrochlorothiazide (HYDRODIURIL) 25 MG tablet TAKE 1 TABLET BY MOUTH EVERY DAY IN THE MORNING  . KLOR-CON M20 20 MEQ tablet TAKE 1 TABLET BY MOUTH EVERY DAY  . lisinopril (ZESTRIL) 40 MG tablet TAKE 1 TABLET BY MOUTH EVERY DAY  . metFORMIN  (GLUCOPHAGE-XR) 500 MG 24 hr tablet Take 2 tablets (1,000 mg total) by mouth daily with breakfast.  . Multiple Vitamin (MULTIVITAMIN) tablet Take 1 tablet by mouth daily.    Vladimir Faster Glycol-Propyl Glycol (SYSTANE FREE OP) Place 2 drops into both eyes daily.   No facility-administered encounter medications on file as of 09/06/2019.    Current Diagnosis/Assessment:  Goals Addressed   None    Hypertension   CMP Latest Ref Rng & Units 06/27/2019 03/28/2019 02/25/2019  Glucose 70 - 99 mg/dL 124(H) 110(H) 86  BUN 6 - 23 mg/dL 15 9 6(L)  Creatinine 0.40 - 1.20 mg/dL 0.97 0.90 0.94  Sodium 135 - 145 mEq/L 139 137 137  Potassium 3.5 - 5.1 mEq/L 3.6 3.6 3.8  Chloride 96 - 112 mEq/L 99 100 103  CO2 19 - 32 mEq/L 30 31 21(L)  Calcium 8.4 - 10.5 mg/dL 10.1 10.3 8.6(L)  Total Protein 6.0 - 8.3 g/dL 7.7 7.9 6.3(L)  Total Bilirubin 0.2 - 1.2 mg/dL 0.4 0.5 0.9  Alkaline Phos 39 - 117 U/L 82 87 115  AST 0 - 37 U/L 23 25 96(H)  ALT 0 - 35 U/L 20 23 176(H)   Office blood pressures are: BP Readings from Last 3 Encounters:  09/05/19 126/70  07/28/19 126/78  07/03/19 124/68   BP today is:  {CHL HP UPSTREAM Pharmacist BP [phone]}  Patient has failed these meds in the past:  Patient checks BP at home {  CHL HP BP Monitoring Frequency:[phone]} Patient home BP readings are ranging:   Patient is currently {CHL Controlled/Uncontrolled:[phone]} on the following medications:   We discussed:  {CHL HP Upstream Pharmacy discussion:[phone]}  Plan: Continue {CHL HP Upstream Pharmacy Plans:[phone]}  Hyperlipidemia   LDL goal < ***  Lipid Panel     Component Value Date/Time   CHOL 182 06/27/2019 0807   TRIG 97.0 06/27/2019 0807   HDL 75.30 06/27/2019 0807   LDLCALC 87 06/27/2019 0807   LDLDIRECT 111.2 02/28/2013 0808    Hepatic Function Latest Ref Rng & Units 06/27/2019 03/28/2019 03/28/2019  Total Protein 6.0 - 8.3 g/dL 7.7 7.9 -  Albumin 3.5 - 5.2 g/dL 4.3 4.3 4.3  AST 0 - 37  U/L 23 25 -  ALT 0 - 35 U/L 20 23 -  Alk Phosphatase 39 - 117 U/L 82 87 -  Total Bilirubin 0.2 - 1.2 mg/dL 0.4 0.5 -  Bilirubin, Direct 0.0 - 0.3 mg/dL - 0.1 -     The 10-year ASCVD risk score Mikey Bussing DC Jr., et al., 2013) is: 37.2%   Values used to calculate the score:     Age: 6 years     Sex: Female     Is Non-Hispanic African American: Yes     Diabetic: Yes     Tobacco smoker: No     Systolic Blood Pressure: 097 mmHg     Is BP treated: Yes     HDL Cholesterol: 75.3 mg/dL     Total Cholesterol: 182 mg/dL   Patient has failed these meds in past: *** Patient is currently {CHL Controlled/Uncontrolled:[phone]} on the following medications:  . ***  We discussed:  {CHL HP Upstream Pharmacy discussion:[phone]}  Plan: Continue {CHL HP Upstream Pharmacy Plans:[phone]}  Diabetes   Recent Relevant Labs: Lab Results  Component Value Date/Time   HGBA1C 7.0 (H) 06/27/2019 08:07 AM   HGBA1C 6.6 (H) 02/22/2019 03:04 AM   MICROALBUR 0.6 08/07/2008 09:54 AM     Checking BG: {CHL HP Blood Glucose Monitoring Frequency:[phone]}  Recent FBG Readings: Recent pre-meal BG readings: *** Recent 2hr PP BG readings:  *** Recent HS BG readings: *** Patient has failed these meds in past: *** Patient is currently {CHL Controlled/Uncontrolled:[phone]} on the following medications: ***  Last diabetic Foot exam:  Lab Results  Component Value Date/Time   HMDIABEYEEXA No Retinopathy 01/20/2019 12:00 AM    Last diabetic Eye exam:  Lab Results  Component Value Date/Time   HMDIABFOOTEX yes 02/24/2010 12:00 AM     We discussed: {CHL HP Upstream Pharmacy discussion:[phone]}  Plan: Continue {CHL HP Upstream Pharmacy DZHGD:[id number]}  Debbora Dus, PharmD Clinical Pharmacist [facility] at [facility] [phone]

## 2019-09-06 NOTE — Telephone Encounter (Signed)
Attempted to reach patient by telephone for initial CCM visit on 09/06/19 at 9 AM. Left voicemail with contact information for rescheduling.  Debbora Dus, PharmD Clinical Pharmacist [facility] [phone]

## 2019-09-13 ENCOUNTER — Other Ambulatory Visit: Payer: Self-pay | Admitting: Family Medicine

## 2019-09-14 ENCOUNTER — Ambulatory Visit: Payer: Medicare Other

## 2019-09-14 ENCOUNTER — Other Ambulatory Visit: Payer: Self-pay

## 2019-09-14 DIAGNOSIS — I10 Essential (primary) hypertension: Secondary | ICD-10-CM

## 2019-09-14 DIAGNOSIS — G959 Disease of spinal cord, unspecified: Secondary | ICD-10-CM | POA: Diagnosis not present

## 2019-09-14 DIAGNOSIS — E119 Type 2 diabetes mellitus without complications: Secondary | ICD-10-CM

## 2019-09-14 NOTE — Progress Notes (Addendum)
Chronic Care Management Pharmacy  Name: [name]  MRN: [id number] DOB: Oct 31, 1940  Chief Complaint/ HPI  [name],  79 y.o., female presents for their Initial CCM visit with the clinical pharmacist via telephone.  PCP : Abner Greenspan, MD  Their chronic conditions include: HTN, HLD, type 2 DM  Office Visits:  09/05/19: PCP visit - pre-operative CV exam, HTN controlled, DM controlled on metformin, likely hold day before surgery, EKG with no changes today, stable cardiac status, hold aspirin 1 week prior to surgery  07/28/19: PCP visit - cervical radiculopathy with worsening left arm and neck pain, try gabapentin 100-200 mg qhs, ice, and OTC Voltaren gel; metformin causing GI effects, try XR  07/03/19: AWV - start metformin 500 mg BID as tolerated, on ACE and statin, last DEXA normal, eye exam up to date, TSH normal,  Lipids fair control   04/07/19: PCP visit - add amlodipine 5 mg   Consult Visit: none in past 6 months   Medications: Outpatient Encounter Medications as of 09/14/2019  Medication Sig  . ACCU-CHEK AVIVA PLUS test strip USE TO CHECK BLOOD SUGAR ONCE DALIY (DX. E11.9)  . ACCU-CHEK FASTCLIX LANCETS MISC Check glucose once daily (Dx. E11.9)  . Accu-Chek Softclix Lancets lancets To check glucose daily and as needed for diabetes type 2  . acetaminophen (TYLENOL) 325 MG tablet Take 325-650 mg by mouth every 6 (six) hours as needed for mild pain or moderate pain.  Marland Kitchen amLODipine (NORVASC) 5 MG tablet Take 1 tablet (5 mg total) by mouth daily.  Marland Kitchen aspirin 81 MG tablet Take 81 mg by mouth daily.  Marland Kitchen atorvastatin (LIPITOR) 10 MG tablet TAKE 1 TABLET (10 MG TOTAL) BY MOUTH DAILY. IN [place]  . Blood Glucose Monitoring Suppl (ACCU-CHEK AVIVA PLUS) w/Device KIT Use to check glucose once daily for DM 2 (Dx. E11.9)  . cholecalciferol (VITAMIN D) 1000 UNITS tablet Take 1,000 Units by mouth daily.  . Emollient (EUCERIN) lotion Apply 1 Bottle topically daily as needed for dry skin.   Marland Kitchen gabapentin (NEURONTIN) 100 MG capsule Take 1-2 capsules (100-200 mg total) by mouth at bedtime. As needed for neck and arm pain (Patient taking differently: Take 100-200 mg by mouth at bedtime as needed (Neck and arm pain). )  . hydrochlorothiazide (HYDRODIURIL) 25 MG tablet TAKE 1 TABLET BY MOUTH EVERY DAY IN THE MORNING  . KLOR-CON M20 20 MEQ tablet TAKE 1 TABLET BY MOUTH EVERY DAY  . lisinopril (ZESTRIL) 40 MG tablet TAKE 1 TABLET BY MOUTH EVERY DAY  . metFORMIN (GLUCOPHAGE-XR) 500 MG 24 hr tablet Take 2 tablets (1,000 mg total) by mouth daily with breakfast.  . Multiple Vitamin (MULTIVITAMIN) tablet Take 1 tablet by mouth daily.    Vladimir Faster Glycol-Propyl Glycol (SYSTANE FREE OP) Place 2 drops into both eyes daily.   No facility-administered encounter medications on file as of 09/14/2019.   Current Diagnosis/Assessment:  SDOH Interventions     Most Recent Value  SDOH Interventions  Financial Strain Interventions Intervention Not Indicated     Goals    . Increase physical activity     When schedule permits, I will resume exercise for 30 minutes 2 days per week.     Marland Kitchen Pharmacy Care Plan     CARE PLAN ENTRY (see longitudinal plan of care for additional care plan information)  Current Barriers:  . Chronic Disease Management support, education, and care coordination needs related to Hypertension and Diabetes   Hypertension BP Readings from Last 3  Encounters:  09/05/19 126/70  07/28/19 126/78  07/03/19 124/68 .  Pharmacist Clinical Goal(s): o Over the next 6 months, patient will work with PharmD and providers to maintain BP goal <130/80 mmHg . Current regimen:   Hydrochlorothiazide 25 mg - 1 tablet daily  Klor Con 20 mEq - 1 tablet daily  Lisinopril 40 mg - 1 tablet daily  Amlodipine 5 mg - 1 tablet daily  . Interventions: o Comprehensive medication review o Recommend wearing sunscreen to prevent sunburn/rash with hydrochlorothiazide . Patient self care activities -  Over the next 6 months, patient will: . Continue current medications as prescribed . Wear sunscreen with UVA and UVB protection and at least 30 SPF. Apply at least 15 minutes before sun exposure. Reapply every 2 hours.  . Incorporate a healthy diet high in vegetables, fruits and whole grains with low-fat dairy products, chicken, fish, legumes, non-tropical vegetable oils and nuts. Limit intake of sweets, sugar-sweetened beverages and red meats.  Diabetes Lab Results  Component Value Date/Time   HGBA1C 7.0 (H) 06/27/2019 08:07 AM   HGBA1C 6.6 (H) 02/22/2019 03:04 AM .  Pharmacist Clinical Goal(s): o Over the next 6 months, patient will work with PharmD and providers to achieve A1c goal <7% . Current regimen:  o Metformin 500 mg ER - 2 tablets daily with breakfast . Interventions: o Comprehensive medication review . Patient self care activities - Over the next 6 months, patient will: o Check blood sugar once daily, document, and provide at future appointments o Contact provider with any episodes of hypoglycemia  Initial goal documentation      Hypertension   CMP Latest Ref Rng & Units 09/15/2019 06/27/2019 03/28/2019  Glucose 70 - 99 mg/dL 109(H) 124(H) 110(H)  BUN 8 - 23 mg/dL _0 Creatinine 0.44 - 1.00 mg/dL 1.00 0.97 0.90  Sodium 135 - 145 mmol/L 137 139 137  Potassium 3.5 - 5.1 mmol/L 3.2(L) 3.6 3.6  Chloride 98 - 111 mmol/L 97(L) 99 100  CO2 22 - 32 mmol/L _1 Calcium 8.9 - 10.3 mg/dL 10.5(H) 10.1 10.3  Total Protein 6.5 - 8.1 g/dL 8.2(H) 7.7 7.9  Total Bilirubin 0.3 - 1.2 mg/dL 0.5 0.4 0.5  Alkaline Phos 38 - 126 U/L 76 82 87  AST 15 - 41 U/L _2 ALT 0 - 44 U/L _3 Office blood pressures are: BP Readings from Last 3 Encounters:  09/15/19 (!) 133/63  09/05/19 126/70  07/28/19 126/78   Patient has failed these meds in the past: none reported Patient checks BP at home several times per month (arm cuff)  Patient home BP readings are ranging: last  checked this Monday --> around 120s/70s  BP goal < 130/80 mmHg  Patient is currently controlled on the following medications:   Hydrochlorothiazide 25 mg - 1 tablet daily  Klor Con 20 mEq - 1 tablet daily  Lisinopril 40 mg - 1 tablet daily  Amlodipine 5 mg - 1 tablet daily   We discussed: confirms adherence, denies questions/concerns, denies history of swelling  Pt asked about rash on legs, arms, and back and whether it could be medication related. The rash is described as raised bumps, with dark spots,  itchy. Reports it has been ongoing for a long time, but worse this year and worse after sun exposure. 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  She is in the sun a lot throughout the  day and not wearing sunscreen.  Plan: Continue current medications; Recommend wearing sunscreen due to risk of photosensitivity/skin cancer with HCTZ.  Hyperlipidemia   LDL goal < 100  Lipid Panel     Component Value Date/Time   CHOL 182 06/27/2019 0807   TRIG 97.0 06/27/2019 0807   HDL 75.30 06/27/2019 0807   LDLCALC 87 06/27/2019 0807   LDLDIRECT 111.2 02/28/2013 0808    Hepatic Function Latest Ref Rng & Units 09/15/2019 06/27/2019 03/28/2019  Total Protein 6.5 - 8.1 g/dL 8.2(H) 7.7 7.9  Albumin 3.5 - 5.0 g/dL 4.4 4.3 4.3  AST 15 - 41 U/L _0 ALT 0 - 44 U/L _1 Alk Phosphatase 38 - 126 U/L 76 82 87  Total Bilirubin 0.3 - 1.2 mg/dL 0.5 0.4 0.5  Bilirubin, Direct 0.0 - 0.3 mg/dL - - 0.1    CBC Latest Ref Rng & Units 09/15/2019 06/27/2019 03/28/2019  WBC 4.0 - 10.5 K/uL 7.4 7.7 8.6  Hemoglobin 12.0 - 15.0 g/dL 13.5 12.5 12.9  Hematocrit 36 - 46 % 41.3 36.8 38.7  Platelets 150 - 400 K/uL 302 291.0 239.0   The 10-year ASCVD risk score Mikey Bussing DC Jr., et al., 2013) is: 39.3%   Values used to calculate the score:     Age: 44 years     Sex: Female     Is Non-Hispanic African American: Yes     Diabetic: Yes     Tobacco smoker: No     Systolic Blood  Pressure: 133 mmHg     Is BP treated: Yes     HDL Cholesterol: 75.3 mg/dL     Total Cholesterol: 182 mg/dL   Patient has failed these meds in past: none Patient is currently controlled on the following medications:  . Atorvastatin 10 mg - 1 tablet daily in the evening  . Aspirin 81 mg - 1 tablet daily for CV prevention  We discussed: confirmed adherence, holding aspirin for the next 7 days prior to surgery, CBC stable   Plan: Continue current medications  Diabetes   History of pancreatitis Reports she was not confirmed diabetic until January 2021  Recent Relevant Labs: Lab Results  Component Value Date/Time   HGBA1C 6.2 (H) 09/15/2019 02:57 PM   HGBA1C 7.0 (H) 06/27/2019 08:07 AM   MICROALBUR 0.6 08/07/2008 09:54 AM    Checking BG: Daily, in the morning Recent FBG Readings: reports < 120 most mornings, 112 today  A1c < 7% Patient has failed these meds in past: metformin  Patient is currently controlled on the following medications:   Metformin 500 mg ER - 2 tablets daily with breakfast  Last diabetic eye exam:  Lab Results  Component Value Date/Time   HMDIABEYEEXA No Retinopathy 01/20/2019 12:00 AM    Last diabetic foot exam: reports PCP completed at last visit    We discussed: tolerating metformin ER well, takes 2 tablets with breakfast  Plan: Continue current medications  Cervical Radiculopathy   Patient has failed these meds in past: none reported Patient is currently controlled on the following medications:   Gabapentin 100 mg - 1-2 tablets qhs PRN for neck or arm pain  Tylenol 325 mg - 1 tablet PRN  We discussed: Denies having much pain lately. Reports exercising her hand has helped and she is using it more working on crafts/crocheting. Denies frequent neck pain. Takes gabapentin PRN about once week and it provides relief and helps her sleep. Takes one regular strength Tylenol during the  day most days.    Plan: Continue current medications  Medication  Management  OTCs: Centrum for women multivitamin- daily, Systane eye drops - daily, vitamin D 1000 IU daily (WNL), Tylenol 325 mg, GasX - PRN   Pharmacy/Benefits: [facility], uses auto-refill, denies concerns   Adherence: Refills timely   Affordability: Denies cost concerns   CCM Follow Up: 6 months, telephone (mornings)   Debbora Dus, PharmD Clinical Pharmacist [facility] [phone]    I have collaborated with the care management provider regarding care management and care coordination activities outlined in this encounter and have reviewed this encounter including documentation in the note and care plan. I am certifying that I agree with the content of this note and encounter as supervising physician. Loura Pardon MD

## 2019-09-14 NOTE — Chronic Care Management (AMB) (Signed)
Chronic Care Management Pharmacy  Name: [name]  MRN: [id number] DOB: [birth year]  Chief Complaint/ HPI  [name],  79 y.o., female presents for their Initial CCM visit with the clinical pharmacist via telephone.  PCP : Abner Greenspan, MD  Their chronic conditions include: HTN, HLD, DM type 2  Office Visits:  09/05/19: PCP visit - pre-operative CV exam, HTN controlled, DM controlled on metformin, likely hold day before surgery, EKG with no changes today, stable cardiac status, hold aspirin 1 week prior to surgery  07/28/19: PCP visit - cervical radiculopathy with worsening left arm and neck pain, try gabapentin 100-200 mg qhs, ice, and OTC Voltaren gel; metformin causing GI effects, try XR  07/03/19: AWV - start metformin 500 mg BID as tolerated, on ACE and statin, last DEXA normal, eye exam up to date, TSH normal,  Lipids fair control   04/07/19: PCP visit - add amlodipine 5 mg   Consult Visit: none in past 6 months   Allergies  Allergen Reactions  . Ibuprofen Palpitations  . Sulfamethoxazole-Trimethoprim Rash    Current Outpatient Medications on File Prior to Visit  Medication Sig Dispense Refill  . ACCU-CHEK AVIVA PLUS test strip USE TO CHECK BLOOD SUGAR ONCE DALIY (DX. E11.9) 50 strip 2  . ACCU-CHEK FASTCLIX LANCETS MISC Check glucose once daily (Dx. E11.9) 102 each 1  . Accu-Chek Softclix Lancets lancets To check glucose daily and as needed for diabetes type 2 100 each 5  . acetaminophen (TYLENOL) 325 MG tablet Take 325-650 mg by mouth every 6 (six) hours as needed for mild pain or moderate pain.    Marland Kitchen amLODipine (NORVASC) 5 MG tablet Take 1 tablet (5 mg total) by mouth daily. 90 tablet 3  . aspirin 81 MG tablet Take 81 mg by mouth daily.    Marland Kitchen atorvastatin (LIPITOR) 10 MG tablet TAKE 1 TABLET (10 MG TOTAL) BY MOUTH DAILY. IN EVENING 90 tablet 0  . Blood Glucose Monitoring Suppl (ACCU-CHEK AVIVA PLUS) w/Device KIT Use to check glucose once daily for DM 2 (Dx.  E11.9) 1 kit 0  . cholecalciferol (VITAMIN D) 1000 UNITS tablet Take 1,000 Units by mouth daily.    . Emollient (EUCERIN) lotion Apply 1 Bottle topically daily as needed for dry skin.    Marland Kitchen gabapentin (NEURONTIN) 100 MG capsule Take 1-2 capsules (100-200 mg total) by mouth at bedtime. As needed for neck and arm pain (Patient taking differently: Take 100-200 mg by mouth at bedtime as needed (Neck and arm pain). ) 60 capsule 3  . hydrochlorothiazide (HYDRODIURIL) 25 MG tablet TAKE 1 TABLET BY MOUTH EVERY DAY IN THE MORNING 90 tablet 0  . KLOR-CON M20 20 MEQ tablet TAKE 1 TABLET BY MOUTH EVERY DAY 90 tablet 0  . lisinopril (ZESTRIL) 40 MG tablet TAKE 1 TABLET BY MOUTH EVERY DAY 90 tablet 0  . metFORMIN (GLUCOPHAGE-XR) 500 MG 24 hr tablet Take 2 tablets (1,000 mg total) by mouth daily with breakfast. 60 tablet 5  . Multiple Vitamin (MULTIVITAMIN) tablet Take 1 tablet by mouth daily.      Vladimir Faster Glycol-Propyl Glycol (SYSTANE FREE OP) Place 2 drops into both eyes daily.     No current facility-administered medications on file prior to visit.   Current Diagnosis/Assessment:  SDOH Interventions     Most Recent Value  SDOH Interventions  Financial Strain Interventions Intervention Not Indicated     Goals Addressed   None    Hypertension   CMP Latest Ref Rng &  Units 06/27/2019 03/28/2019 02/25/2019  Glucose 70 - 99 mg/dL 652(J) 095(U) 86  BUN 6 - 23 mg/dL 15 9 6(L)  Creatinine 4.73 - 1.20 mg/dL 9.42 6.10 3.10  Sodium 135 - 145 mEq/L 139 137 137  Potassium 3.5 - 5.1 mEq/L 3.6 3.6 3.8  Chloride 96 - 112 mEq/L 99 100 103  CO2 19 - 32 mEq/L 30 31 21(L)  Calcium 8.4 - 10.5 mg/dL 65.8 39.5 5.1(A)  Total Protein 6.0 - 8.3 g/dL 7.7 7.9 6.3(L)  Total Bilirubin 0.2 - 1.2 mg/dL 0.4 0.5 0.9  Alkaline Phos 39 - 117 U/L 82 87 115  AST 0 - 37 U/L 23 25 96(H)  ALT 0 - 35 U/L 20 23 176(H)   Office blood pressures are: BP Readings from Last 3 Encounters:  09/05/19 126/70  07/28/19 126/78  07/03/19  124/68   Patient has failed these meds in the past: none reported Patient checks BP at home several times per month (arm cuff)  Patient home BP readings are ranging: last checked last Monday --> 120s/70s  BP goal < 130/80 mmHg  Patient is currently controlled on the following medications:   Hydrochlorothiazide 25 mg - 1 tablet daily  Klor Con 20 mEq - 1 tablet daily  Lisinopril 40 mg - 1 tablet daily  Amlodipine 5 mg - 1 tablet daily   We discussed: confirms adherence, refills timely Pt asked about rash on legs, arms and back with raised bumps, some discoloration/dark spots, itchiness. She reports it has been ongoing for a long time, but worse this year and onset by sun exposure. She also mentions she has been eating more tomatoes lately which has triggered a reaction in the past. She does not wear sunscreen. We discussed importance of wearing sunscreen due to photosensitivity with HCTZ. Try avoiding tomatoes and seeing if rash resolves.  Plan: Continue current medications  Hyperlipidemia   LDL goal < 100  Lipid Panel     Component Value Date/Time   CHOL 182 06/27/2019 0807   TRIG 97.0 06/27/2019 0807   HDL 75.30 06/27/2019 0807   LDLCALC 87 06/27/2019 0807   LDLDIRECT 111.2 02/28/2013 0808    Hepatic Function Latest Ref Rng & Units 06/27/2019 03/28/2019 03/28/2019  Total Protein 6.0 - 8.3 g/dL 7.7 7.9 -  Albumin 3.5 - 5.2 g/dL 4.3 4.3 4.3  AST 0 - 37 U/L 23 25 -  ALT 0 - 35 U/L 20 23 -  Alk Phosphatase 39 - 117 U/L 82 87 -  Total Bilirubin 0.2 - 1.2 mg/dL 0.4 0.5 -  Bilirubin, Direct 0.0 - 0.3 mg/dL - 0.1 -    The 33-LQNA ASCVD risk score Denman George DC Jr., et al., 2013) is: 37.2%   Values used to calculate the score:     Age: 57 years     Sex: Female     Is Non-Hispanic African American: Yes     Diabetic: Yes     Tobacco smoker: No     Systolic Blood Pressure: 126 mmHg     Is BP treated: Yes     HDL Cholesterol: 75.3 mg/dL     Total Cholesterol: 182 mg/dL   Patient has  failed these meds in past: none Patient is currently controlled on the following medications:  . Atorvastatin 10 mg - 1 tablet daily in the evening  . Aspirin 81 mg - 1 tablet daily  We discussed: confirmed adherence, holding aspirin for the next 7 days prior to surgery   Plan: Continue  current medications  Diabetes   History of pancreatitis Reports she was not confirmed diabetic until January 2021  Recent Relevant Labs: Lab Results  Component Value Date/Time   HGBA1C 7.0 (H) 06/27/2019 08:07 AM   HGBA1C 6.6 (H) 02/22/2019 03:04 AM   MICROALBUR 0.6 08/07/2008 09:54 AM    Checking BG: Daily, in the morning Recent FBG Readings: reports < 120 most mornings, 112  A1c < 7% Patient has failed these meds in past: metformin  Patient is currently controlled on the following medications:   Metformin 500 mg ER - 2 tablets daily with breakfast  Last diabetic eye exam:  Lab Results  Component Value Date/Time   HMDIABEYEEXA No Retinopathy 01/20/2019 12:00 AM    Last diabetic foot exam: reports PCP completed at last visit    We discussed: tolerating metformin ER well, takes 2 tablets with breakfast  Plan: Continue current medications  Cervical Radiculopathy   Patient has failed these meds in past: none reported Patient is currently controlled on the following medications:   Gabapentin 100 mg - 1-2 tablets qhs PRN for neck or arm pain  Tylenol 325 mg - 1 tablet PRN  We discussed: denies having much pain, reports trying to exercising her hand has helped, using it more working on crafts/crocheting; denies frequent neck pain; takes gabapentin PRN about once week and it provides relief, helps her sleep, may take one regular strength Tylenol during the day   Plan: Continue current medications  Medication Management  OTCs: Centrum for women multivitamin- daily, Systane eye drops - daily, vitamin D 1000 IU daily (WNL), Tylenol 325 mg, GasX - PRN   Pharmacy/Benefits: [facility],  uses auto-refill, denies concerns   Adherence: Refills timely   Affordability: Denies cost concerns   CCM Follow Up: 6 months, telephone (mornings)   Debbora Dus, PharmD Clinical Pharmacist [facility] [phone]

## 2019-09-14 NOTE — Pre-Procedure Instructions (Signed)
Your procedure is scheduled on Thursday, August 5 from 11:00 AM- 2:33 PM.  Report to [facility] Main Entrance "A" at 08:00 A.M., and check in at the Admitting office.  Call this number if you have problems the morning of surgery:  [phone]  Call [phone] if you have any questions prior to your surgery date Monday-Friday 8am-4pm.    Remember:  Do not eat after midnight the night before your surgery.  You may drink clear liquids until 08:00 AM the morning of your surgery.   Clear liquids allowed are: Water, Non-Citrus Juices (without pulp), Carbonated Beverages, Clear Tea, Black Coffee Only, and Gatorade.  Enhanced Recovery after Surgery for Orthopedics Enhanced Recovery after Surgery is a protocol used to improve the stress on your body and your recovery after surgery.  Patient Instructions  . The night before surgery:  o No food after midnight. ONLY clear liquids after midnight.  . The day of surgery (if you have diabetes): o  o Drink ONE (1) Gatorade 2 (G2) by 08:00 AM the morning of surgery. o This drink was given to you during your hospital pre-op appointment visit.  o Color of the Gatorade may vary. Red is not allowed. o Nothing else to drink after completing the  Gatorade 2 (G2).         If you have questions, please contact your surgeon's office.     Take these medicines the morning of surgery with A SIP OF WATER: amLODipine (NORVASC)  Polyethyl Glycol-Propyl Glycol (SYSTANE FREE OP) eye drops   IF NEEDED: acetaminophen (TYLENOL)   Follow your surgeon's instructions on when to stop Aspirin.  If no instructions were given by your surgeon then you will need to call the office to get those instructions.    As of today, STOP taking any Aleve, Naproxen, Ibuprofen, Motrin, Advil, Goody's, BC's, all herbal medications, fish oil, and all vitamins.    WHAT DO I DO ABOUT MY DIABETES MEDICATION?  Marland Kitchen Do not take metFORMIN (GLUCOPHAGE-XR) the morning of  surgery.   HOW TO MANAGE YOUR DIABETES BEFORE AND AFTER SURGERY  Why is it important to control my blood sugar before and after surgery? . Improving blood sugar levels before and after surgery helps healing and can limit problems. . A way of improving blood sugar control is eating a healthy diet by: o  Eating less sugar and carbohydrates o  Increasing activity/exercise o  Talking with your doctor about reaching your blood sugar goals . High blood sugars (greater than 180 mg/dL) can raise your risk of infections and slow your recovery, so you will need to focus on controlling your diabetes during the weeks before surgery. . Make sure that the doctor who takes care of your diabetes knows about your planned surgery including the date and location.  How do I manage my blood sugar before surgery? . Check your blood sugar at least 4 times a day, starting 2 days before surgery, to make sure that the level is not too high or low. . Check your blood sugar the morning of your surgery when you wake up and every 2 hours until you get to the Short Stay unit. o If your blood sugar is less than 70 mg/dL, you will need to treat for low blood sugar: - Do not take insulin. - Treat a low blood sugar (less than 70 mg/dL) with  cup of clear juice (cranberry or apple), 4 glucose tablets, OR glucose gel. - Recheck blood sugar in 15  minutes after treatment (to make sure it is greater than 70 mg/dL). If your blood sugar is not greater than 70 mg/dL on recheck, call [phone] for further instructions. . Report your blood sugar to the short stay nurse when you get to Short Stay.  . If you are admitted to the hospital after surgery: o Your blood sugar will be checked by the staff and you will probably be given insulin after surgery (instead of oral diabetes medicines) to make sure you have good blood sugar levels. o The goal for blood sugar control after surgery is 80-180 mg/dL.       The Morning of Surgery:                 Do not wear jewelry, make up, or nail polish.            Do not wear lotions, powders, perfumes, or deodorant.            Do not shave 48 hours prior to surgery.              Do not bring valuables to the hospital.            [facility] is not responsible for any belongings or valuables.  Do NOT Smoke (Tobacco/Vaping) or drink Alcohol 24 hours prior to your procedure. If you use a CPAP at night, you may bring all equipment for your overnight stay.   Contacts, glasses, dentures or bridgework may not be worn into surgery.      For patients admitted to the hospital, discharge time will be determined by your treatment team.   Patients discharged the day of surgery will not be allowed to drive home, and someone needs to stay with them for 24 hours.    Special instructions:   [place]- Preparing For Surgery  Before surgery, you can play an important role. Because skin is not sterile, your skin needs to be as free of germs as possible. You can reduce the number of germs on your skin by washing with CHG (chlorahexidine gluconate) Soap before surgery.  CHG is an antiseptic cleaner which kills germs and bonds with the skin to continue killing germs even after washing.    Oral Hygiene is also important to reduce your risk of infection.  Remember - BRUSH YOUR TEETH THE MORNING OF SURGERY WITH YOUR REGULAR TOOTHPASTE  Please do not use if you have an allergy to CHG or antibacterial soaps. If your skin becomes reddened/irritated stop using the CHG.  Do not shave (including legs and underarms) for at least 48 hours prior to first CHG shower. It is OK to shave your face.  Please follow these instructions carefully.   1. Shower the NIGHT BEFORE SURGERY and the MORNING OF SURGERY with CHG Soap.   2. If you chose to wash your hair, wash your hair first as usual with your normal shampoo.  3. After you shampoo, rinse your hair and body thoroughly to remove the shampoo.  4. Use CHG as  you would any other liquid soap. You can apply CHG directly to the skin and wash gently with a scrungie or a clean washcloth.   5. Apply the CHG Soap to your body ONLY FROM THE NECK DOWN.  Do not use on open wounds or open sores. Avoid contact with your eyes, ears, mouth and genitals (private parts). Wash Face and genitals (private parts)  with your normal soap.   6. Wash thoroughly, paying special attention to  the area where your surgery will be performed.  7. Thoroughly rinse your body with warm water from the neck down.  8. DO NOT shower/wash with your normal soap after using and rinsing off the CHG Soap.  9. Pat yourself dry with a CLEAN TOWEL.  10. Wear CLEAN PAJAMAS to bed the night before surgery  11. Place CLEAN SHEETS on your bed the night of your first shower and DO NOT SLEEP WITH PETS.   Day of Surgery: Wear Clean/Comfortable clothing the morning of surgery. Do not apply any deodorants/lotions.   Remember to brush your teeth WITH YOUR REGULAR TOOTHPASTE.   Please read over the following fact sheets that you were given.

## 2019-09-15 ENCOUNTER — Encounter (HOSPITAL_COMMUNITY)
Admission: RE | Admit: 2019-09-15 | Discharge: 2019-09-15 | Disposition: A | Discharge status: Home / Self Care | Payer: Medicare Other | Source: Ambulatory Visit | Attending: Orthopedic Surgery | Admitting: Orthopedic Surgery

## 2019-09-15 ENCOUNTER — Other Ambulatory Visit: Payer: Self-pay

## 2019-09-15 ENCOUNTER — Encounter (HOSPITAL_COMMUNITY): Payer: Self-pay

## 2019-09-15 DIAGNOSIS — Z01812 Encounter for preprocedural laboratory examination: Secondary | ICD-10-CM | POA: Insufficient documentation

## 2019-09-15 LAB — COMPREHENSIVE METABOLIC PANEL
ALT: 22 U/L (ref 0–44)
AST: 31 U/L (ref 15–41)
Albumin: 4.4 g/dL (ref 3.5–5.0)
Alkaline Phosphatase: 76 U/L (ref 38–126)
Anion gap: 12 (ref 5–15)
BUN: 11 mg/dL (ref 8–23)
CO2: 28 mmol/L (ref 22–32)
Calcium: 10.5 mg/dL — ABNORMAL HIGH (ref 8.9–10.3)
Chloride: 97 mmol/L — ABNORMAL LOW (ref 98–111)
Creatinine, Ser: 1 mg/dL (ref 0.44–1.00)
GFR calc Af Amer: >60 mL/min (ref >60)
GFR calc non Af Amer: 54 mL/min — ABNORMAL LOW (ref >60)
Glucose, Bld: 109 mg/dL — ABNORMAL HIGH (ref 70–99)
Potassium: 3.2 mmol/L — ABNORMAL LOW (ref 3.5–5.1)
Sodium: 137 mmol/L (ref 135–145)
Total Bilirubin: 0.5 mg/dL (ref 0.3–1.2)
Total Protein: 8.2 g/dL — ABNORMAL HIGH (ref 6.5–8.1)

## 2019-09-15 LAB — URINALYSIS, ROUTINE W REFLEX MICROSCOPIC
Bilirubin Urine: NEGATIVE
Glucose, UA: NEGATIVE mg/dL
Hgb urine dipstick: NEGATIVE
Ketones, ur: NEGATIVE mg/dL
Nitrite: NEGATIVE
Protein, ur: NEGATIVE mg/dL
Specific Gravity, Urine: 1.006 (ref 1.005–1.030)
pH: 7 (ref 5.0–8.0)

## 2019-09-15 LAB — CBC WITH DIFFERENTIAL/PLATELET
Abs Immature Granulocytes: 0.02 10*3/uL (ref 0.00–0.07)
Basophils Absolute: 0.1 10*3/uL (ref 0.0–0.1)
Basophils Relative: 1 %
Eosinophils Absolute: 0.1 10*3/uL (ref 0.0–0.5)
Eosinophils Relative: 2 %
HCT: 41.3 % (ref 36.0–46.0)
Hemoglobin: 13.5 g/dL (ref 12.0–15.0)
Immature Granulocytes: 0 %
Lymphocytes Relative: 28 %
Lymphs Abs: 2.1 10*3/uL (ref 0.7–4.0)
MCH: 27.8 pg (ref 26.0–34.0)
MCHC: 32.7 g/dL (ref 30.0–36.0)
MCV: 85.2 fL (ref 80.0–100.0)
Monocytes Absolute: 0.5 10*3/uL (ref 0.1–1.0)
Monocytes Relative: 7 %
Neutro Abs: 4.7 10*3/uL (ref 1.7–7.7)
Neutrophils Relative %: 62 %
Platelets: 302 10*3/uL (ref 150–400)
RBC: 4.85 MIL/uL (ref 3.87–5.11)
RDW: 13.6 % (ref 11.5–15.5)
WBC: 7.4 10*3/uL (ref 4.0–10.5)
nRBC: 0 % (ref 0.0–0.2)

## 2019-09-15 LAB — TYPE AND SCREEN
ABO/RH(D): O POS
Antibody Screen: NEGATIVE

## 2019-09-15 LAB — APTT: aPTT: 25 seconds (ref 24–36)

## 2019-09-15 LAB — PROTIME-INR
INR: 1 (ref 0.8–1.2)
Prothrombin Time: 12.6 seconds (ref 11.4–15.2)

## 2019-09-15 LAB — SURGICAL PCR SCREEN
MRSA, PCR: NEGATIVE
Staphylococcus aureus: POSITIVE — AB

## 2019-09-15 LAB — GLUCOSE, CAPILLARY: Glucose-Capillary: 91 mg/dL (ref 70–99)

## 2019-09-15 LAB — HEMOGLOBIN A1C
Hgb A1c MFr Bld: 6.2 % — ABNORMAL HIGH (ref 4.8–5.6)
Mean Plasma Glucose: 131.24 mg/dL

## 2019-09-15 NOTE — Progress Notes (Signed)
PCP - [place], MD Cardiologist - pt denies    Chest x-ray - n/a EKG - 09/05/19 Stress Test - pt denies ECHO - 09/28/13 Cardiac Cath - pt denies    Fasting Blood Sugar - 90-120s Checks Blood Sugar 1x/day as soon as she wakes up  Blood Thinner Instructions:n/a Aspirin Instructions: Follow your surgeon's instructions on when to stop Aspirin.  If no instructions were given by your surgeon then you will need to call the office to get those instructions.    Per pt her last dose ASA 09/15/19  ERAS Protcol -yes PRE-SURGERY Ensure or G2- g2  COVID TEST- 09/19/19  Coronavirus Screening  Have you experienced the following symptoms:  Cough yes/no: No Fever (>100.38F)  yes/no: No Runny nose yes/no: No Sore throat yes/no: No Difficulty breathing/shortness of breath  yes/no: No  Have you or a family member traveled in the last 14 days and where? yes/no: No   If the patient indicates "YES" to the above questions, their PAT will be rescheduled to limit the exposure to others and, the surgeon will be notified. THE PATIENT WILL NEED TO BE ASYMPTOMATIC FOR 14 DAYS.   If the patient is not experiencing any of these symptoms, the PAT nurse will instruct them to NOT bring anyone with them to their appointment since they may have these symptoms or traveled as well.   Please remind your patients and families that hospital visitation restrictions are in effect and the importance of the restrictions.     Anesthesia review: n/a  Patient denies shortness of breath, fever, cough and chest pain at PAT appointment   All instructions explained to the patient, with a verbal understanding of the material. Patient agrees to go over the instructions while at home for a better understanding. Patient also instructed to self quarantine after being tested for COVID-19. The opportunity to ask questions was provided.

## 2019-09-15 NOTE — Patient Instructions (Addendum)
Dear [name],  It was a pleasure meeting you during our initial appointment on September 14, 2019. Below is a summary of the goals we discussed and components of chronic care management. Please contact me anytime with questions or concerns.   Visit Information  Goals Addressed            This Visit's Progress   . Pharmacy Care Plan       CARE PLAN ENTRY (see longitudinal plan of care for additional care plan information)  Current Barriers:  . Chronic Disease Management support, education, and care coordination needs related to Hypertension and Diabetes   Hypertension BP Readings from Last 3 Encounters:  09/05/19 126/70  07/28/19 126/78  07/03/19 124/68 .  Pharmacist Clinical Goal(s): o Over the next 6 months, patient will work with PharmD and providers to maintain BP goal <130/80 mmHg . Current regimen:   Hydrochlorothiazide 25 mg - 1 tablet daily  Klor Con 20 mEq - 1 tablet daily  Lisinopril 40 mg - 1 tablet daily  Amlodipine 5 mg - 1 tablet daily  . Interventions: o Comprehensive medication review o Recommend wearing sunscreen to prevent sunburn/rash with hydrochlorothiazide . Patient self care activities - Over the next 6 months, patient will: . Continue current medications as prescribed . Wear sunscreen with UVA and UVB protection and at least 30 SPF. Apply at least 15 minutes before sun exposure. Reapply every 2 hours.  . Incorporate a healthy diet high in vegetables, fruits and whole grains with low-fat dairy products, chicken, fish, legumes, non-tropical vegetable oils and nuts. Limit intake of sweets, sugar-sweetened beverages and red meats.  Diabetes Lab Results  Component Value Date/Time   HGBA1C 7.0 (H) 06/27/2019 08:07 AM   HGBA1C 6.6 (H) 02/22/2019 03:04 AM .  Pharmacist Clinical Goal(s): o Over the next 6 months, patient will work with PharmD and providers to achieve A1c goal <7% . Current regimen:  o Metformin 500 mg ER - 2 tablets daily with  breakfast . Interventions: o Comprehensive medication review . Patient self care activities - Over the next 6 months, patient will: o Check blood sugar once daily, document, and provide at future appointments o Contact provider with any episodes of hypoglycemia  Initial goal documentation       [name] was given information about Chronic Care Management services today including:  1. CCM service includes personalized support from designated clinical staff supervised by her physician, including individualized plan of care and coordination with other care providers 2. 24/7 contact phone numbers for assistance for urgent and routine care needs. 3. Standard insurance, coinsurance, copays and deductibles apply for chronic care management only during months in which we provide at least 20 minutes of these services. Most insurances cover these services at 100%, however patients may be responsible for any copay, coinsurance and/or deductible if applicable. This service may help you avoid the need for more expensive face-to-face services. 4. Only one practitioner may furnish and bill the service in a calendar month. 5. The patient may stop CCM services at any time (effective at the end of the month) by phone call to the office staff.  Patient agreed to services and verbal consent obtained.   The patient verbalized understanding of instructions provided today and agreed to receive a mailed copy of patient instruction and/or educational materials. Telephone follow up appointment with pharmacy team member scheduled for: 03/21/20 at 8:30 AM (telephone visit)  Debbora Dus, PharmD Clinical Pharmacist [facility] [phone]  Carbohydrate Counting for Diabetes Mellitus, Adult  Carbohydrate counting is a method of keeping track of how many carbohydrates you eat. Eating carbohydrates naturally increases the amount of sugar (glucose) in the blood. Counting how many  carbohydrates you eat helps keep your blood glucose within normal limits, which helps you manage your diabetes (diabetes mellitus). It is important to know how many carbohydrates you can safely have in each meal. This is different for every person. A diet and nutrition specialist (registered dietitian) can help you make a meal plan and calculate how many carbohydrates you should have at each meal and snack. Carbohydrates are found in the following foods:  Grains, such as breads and cereals.  Dried beans and soy products.  Starchy vegetables, such as potatoes, peas, and corn.  Fruit and fruit juices.  Milk and yogurt.  Sweets and snack foods, such as cake, cookies, candy, chips, and soft drinks. How do I count carbohydrates? There are two ways to count carbohydrates in food. You can use either of the methods or a combination of both. Reading "Nutrition Facts" on packaged food The "Nutrition Facts" list is included on the labels of almost all packaged foods and beverages in the [place] It includes:  The serving size.  Information about nutrients in each serving, including the grams (g) of carbohydrate per serving. To use the "Nutrition Facts":  Decide how many servings you will have.  Multiply the number of servings by the number of carbohydrates per serving.  The resulting number is the total amount of carbohydrates that you will be having. Learning standard serving sizes of other foods When you eat carbohydrate foods that are not packaged or do not include "Nutrition Facts" on the label, you need to measure the servings in order to count the amount of carbohydrates:  Measure the foods that you will eat with a food scale or measuring cup, if needed.  Decide how many standard-size servings you will eat.  Multiply the number of servings by 15. Most carbohydrate-rich foods have about 15 g of carbohydrates per serving. ? For example, if you eat 8 oz (170 g) of strawberries, you will  have eaten 2 servings and 30 g of carbohydrates (2 servings x 15 g = 30 g).  For foods that have more than one food mixed, such as soups and casseroles, you must count the carbohydrates in each food that is included. The following list contains standard serving sizes of common carbohydrate-rich foods. Each of these servings has about 15 g of carbohydrates:   hamburger bun or  English muffin.   oz (15 mL) syrup.   oz (14 g) jelly.  1 slice of bread.  1 six-inch tortilla.  3 oz (85 g) cooked rice or pasta.  4 oz (113 g) cooked dried beans.  4 oz (113 g) starchy vegetable, such as peas, corn, or potatoes.  4 oz (113 g) hot cereal.  4 oz (113 g) mashed potatoes or  of a large baked potato.  4 oz (113 g) canned or frozen fruit.  4 oz (120 mL) fruit juice.  4-6 crackers.  6 chicken nuggets.  6 oz (170 g) unsweetened dry cereal.  6 oz (170 g) plain fat-free yogurt or yogurt sweetened with artificial sweeteners.  8 oz (240 mL) milk.  8 oz (170 g) fresh fruit or one small piece of fruit.  24 oz (680 g) popped popcorn. Example of carbohydrate counting Sample meal  3 oz (85 g) chicken breast.  6 oz (170  g) brown rice.  4 oz (113 g) corn.  8 oz (240 mL) milk.  8 oz (170 g) strawberries with sugar-free whipped topping. Carbohydrate calculation 1. Identify the foods that contain carbohydrates: ? Rice. ? Corn. ? Milk. ? Strawberries. 2. Calculate how many servings you have of each food: ? 2 servings rice. ? 1 serving corn. ? 1 serving milk. ? 1 serving strawberries. 3. Multiply each number of servings by 15 g: ? 2 servings rice x 15 g = 30 g. ? 1 serving corn x 15 g = 15 g. ? 1 serving milk x 15 g = 15 g. ? 1 serving strawberries x 15 g = 15 g. 4. Add together all of the amounts to find the total grams of carbohydrates eaten: ? 30 g + 15 g + 15 g + 15 g = 75 g of carbohydrates total. Summary  Carbohydrate counting is a method of keeping track of how  many carbohydrates you eat.  Eating carbohydrates naturally increases the amount of sugar (glucose) in the blood.  Counting how many carbohydrates you eat helps keep your blood glucose within normal limits, which helps you manage your diabetes.  A diet and nutrition specialist (registered dietitian) can help you make a meal plan and calculate how many carbohydrates you should have at each meal and snack. This information is not intended to replace advice given to you by your health care provider. Make sure you discuss any questions you have with your health care provider. Document Revised: 08/27/2016 Document Reviewed: 07/17/2015 Elsevier Patient Education  [place].

## 2019-09-15 NOTE — Progress Notes (Signed)
Inboxed Neita Garnet, scheduler for Dr. Lynann Bologna with abnormal urine results

## 2019-09-19 ENCOUNTER — Other Ambulatory Visit: Payer: Self-pay

## 2019-09-19 ENCOUNTER — Other Ambulatory Visit
Admission: RE | Admit: 2019-09-19 | Discharge: 2019-09-19 | Disposition: A | Discharge status: Home / Self Care | Payer: Medicare Other | Source: Ambulatory Visit | Attending: Orthopedic Surgery | Admitting: Orthopedic Surgery

## 2019-09-19 DIAGNOSIS — Z20822 Contact with and (suspected) exposure to covid-19: Secondary | ICD-10-CM | POA: Insufficient documentation

## 2019-09-19 DIAGNOSIS — Z01812 Encounter for preprocedural laboratory examination: Secondary | ICD-10-CM | POA: Insufficient documentation

## 2019-09-20 LAB — SARS CORONAVIRUS 2 (TAT 6-24 HRS): SARS Coronavirus 2: NEGATIVE

## 2019-09-21 ENCOUNTER — Observation Stay (HOSPITAL_COMMUNITY)
Admission: RE | Admit: 2019-09-21 | Discharge: 2019-09-22 | Disposition: A | Discharge status: Home / Self Care | Payer: Medicare Other | Attending: Orthopedic Surgery | Admitting: Orthopedic Surgery

## 2019-09-21 ENCOUNTER — Encounter (HOSPITAL_COMMUNITY)
Admission: RE | Disposition: A | Discharge status: Home / Self Care | Payer: Self-pay | Source: Home / Self Care | Attending: Orthopedic Surgery

## 2019-09-21 ENCOUNTER — Ambulatory Visit (HOSPITAL_COMMUNITY): Payer: Medicare Other

## 2019-09-21 ENCOUNTER — Ambulatory Visit (HOSPITAL_COMMUNITY): Payer: Medicare Other | Admitting: Anesthesiology

## 2019-09-21 ENCOUNTER — Other Ambulatory Visit: Payer: Self-pay

## 2019-09-21 ENCOUNTER — Encounter (HOSPITAL_COMMUNITY): Payer: Self-pay | Admitting: Orthopedic Surgery

## 2019-09-21 DIAGNOSIS — Z79899 Other long term (current) drug therapy: Secondary | ICD-10-CM | POA: Insufficient documentation

## 2019-09-21 DIAGNOSIS — Z7984 Long term (current) use of oral hypoglycemic drugs: Secondary | ICD-10-CM | POA: Diagnosis not present

## 2019-09-21 DIAGNOSIS — G959 Disease of spinal cord, unspecified: Secondary | ICD-10-CM | POA: Diagnosis present

## 2019-09-21 DIAGNOSIS — M79602 Pain in left arm: Secondary | ICD-10-CM | POA: Diagnosis present

## 2019-09-21 DIAGNOSIS — G9529 Other cord compression: Secondary | ICD-10-CM | POA: Diagnosis not present

## 2019-09-21 DIAGNOSIS — G9520 Unspecified cord compression: Secondary | ICD-10-CM | POA: Diagnosis not present

## 2019-09-21 DIAGNOSIS — I10 Essential (primary) hypertension: Secondary | ICD-10-CM | POA: Diagnosis not present

## 2019-09-21 DIAGNOSIS — Z7982 Long term (current) use of aspirin: Secondary | ICD-10-CM | POA: Insufficient documentation

## 2019-09-21 DIAGNOSIS — M5412 Radiculopathy, cervical region: Secondary | ICD-10-CM | POA: Diagnosis not present

## 2019-09-21 DIAGNOSIS — E119 Type 2 diabetes mellitus without complications: Secondary | ICD-10-CM | POA: Diagnosis not present

## 2019-09-21 DIAGNOSIS — M4322 Fusion of spine, cervical region: Secondary | ICD-10-CM | POA: Diagnosis not present

## 2019-09-21 DIAGNOSIS — Z419 Encounter for procedure for purposes other than remedying health state, unspecified: Secondary | ICD-10-CM

## 2019-09-21 HISTORY — PX: ANTERIOR CERVICAL DECOMPRESSION/DISCECTOMY FUSION 4 LEVELS: SHX5556

## 2019-09-21 LAB — GLUCOSE, CAPILLARY
Glucose-Capillary: 102 mg/dL — ABNORMAL HIGH (ref 70–99)
Glucose-Capillary: 131 mg/dL — ABNORMAL HIGH (ref 70–99)
Glucose-Capillary: 127 mg/dL — ABNORMAL HIGH (ref 70–99)
Glucose-Capillary: 196 mg/dL — ABNORMAL HIGH (ref 70–99)

## 2019-09-21 LAB — ABO/RH: ABO/RH(D): O POS

## 2019-09-21 SURGERY — ANTERIOR CERVICAL DECOMPRESSION/DISCECTOMY FUSION 4 LEVELS
Anesthesia: General | Site: Spine Cervical

## 2019-09-21 MED ORDER — ONDANSETRON HCL 4 MG/2ML IJ SOLN: 4.0000 mg | Freq: Four times a day (QID) | INTRAMUSCULAR | Status: DC | PRN

## 2019-09-21 MED ORDER — BUPIVACAINE-EPINEPHRINE (PF) 0.25% -1:200000 IJ SOLN
INTRAMUSCULAR | Status: AC
  Filled 2019-09-21: qty 20

## 2019-09-21 MED ORDER — PHENYLEPHRINE HCL-NACL 10-0.9 MG/250ML-% IV SOLN
INTRAVENOUS | Status: DC | PRN
  Administered 2019-09-21: 25 ug/min via INTRAVENOUS

## 2019-09-21 MED ORDER — THROMBIN 20000 UNITS EX SOLR
CUTANEOUS | Status: DC | PRN
  Administered 2019-09-21: 20 mL via TOPICAL

## 2019-09-21 MED ORDER — METHOCARBAMOL 500 MG PO TABS: 500.0000 mg | ORAL_TABLET | Freq: Four times a day (QID) | ORAL | 1 refills | Status: DC | PRN

## 2019-09-21 MED ORDER — BISACODYL 5 MG PO TBEC: 5.0000 mg | DELAYED_RELEASE_TABLET | Freq: Every day | ORAL | Status: DC | PRN

## 2019-09-21 MED ORDER — MINERAL OIL LIGHT 100 % EX OIL
TOPICAL_OIL | CUTANEOUS | Status: DC | PRN
  Administered 2019-09-21: 1 via TOPICAL

## 2019-09-21 MED ORDER — FENTANYL CITRATE (PF) 250 MCG/5ML IJ SOLN
INTRAMUSCULAR | Status: AC
  Filled 2019-09-21: qty 5

## 2019-09-21 MED ORDER — PROPOFOL 10 MG/ML IV BOLUS
INTRAVENOUS | Status: AC
  Filled 2019-09-21: qty 20

## 2019-09-21 MED ORDER — ORAL CARE MOUTH RINSE: 15.0000 mL | Freq: Once | OROMUCOSAL | Status: AC

## 2019-09-21 MED ORDER — ACETAMINOPHEN 325 MG PO TABS: 650.0000 mg | ORAL_TABLET | ORAL | Status: DC | PRN

## 2019-09-21 MED ORDER — VITAMIN D 25 MCG (1000 UNIT) PO TABS
1000.0000 [IU] | ORAL_TABLET | Freq: Every day | ORAL | Status: DC
  Administered 2019-09-22: 1000 [IU] via ORAL
  Filled 2019-09-21: qty 1

## 2019-09-21 MED ORDER — LISINOPRIL 20 MG PO TABS
40.0000 mg | ORAL_TABLET | Freq: Every day | ORAL | Status: DC
  Administered 2019-09-22: 40 mg via ORAL
  Filled 2019-09-21: qty 2

## 2019-09-21 MED ORDER — OXYCODONE-ACETAMINOPHEN 5-325 MG PO TABS: 1.0000 | ORAL_TABLET | ORAL | 0 refills | Status: DC | PRN

## 2019-09-21 MED ORDER — FLEET ENEMA 7-19 GM/118ML RE ENEM: 1.0000 | ENEMA | Freq: Once | RECTAL | Status: DC | PRN

## 2019-09-21 MED ORDER — POVIDONE-IODINE 7.5 % EX SOLN
Freq: Once | CUTANEOUS | Status: DC
  Filled 2019-09-21: qty 118

## 2019-09-21 MED ORDER — LIDOCAINE 2% (20 MG/ML) 5 ML SYRINGE
INTRAMUSCULAR | Status: AC
  Filled 2019-09-21: qty 10

## 2019-09-21 MED ORDER — ALUM & MAG HYDROXIDE-SIMETH 200-200-20 MG/5ML PO SUSP: 30.0000 mL | Freq: Four times a day (QID) | ORAL | Status: DC | PRN

## 2019-09-21 MED ORDER — METHOCARBAMOL 1000 MG/10ML IJ SOLN
500.0000 mg | Freq: Four times a day (QID) | INTRAVENOUS | Status: DC | PRN
  Filled 2019-09-21: qty 5

## 2019-09-21 MED ORDER — ROCURONIUM BROMIDE 10 MG/ML (PF) SYRINGE
PREFILLED_SYRINGE | INTRAVENOUS | Status: AC
  Filled 2019-09-21: qty 10

## 2019-09-21 MED ORDER — ONDANSETRON HCL 4 MG/2ML IJ SOLN
INTRAMUSCULAR | Status: DC | PRN
  Administered 2019-09-21: 4 mg via INTRAVENOUS

## 2019-09-21 MED ORDER — OXYCODONE-ACETAMINOPHEN 5-325 MG PO TABS
1.0000 | ORAL_TABLET | ORAL | Status: DC | PRN
  Administered 2019-09-21 – 2019-09-22 (×4): 1 via ORAL
  Filled 2019-09-21 (×4): qty 1

## 2019-09-21 MED ORDER — ZOLPIDEM TARTRATE 5 MG PO TABS: 5.0000 mg | ORAL_TABLET | Freq: Every evening | ORAL | Status: DC | PRN

## 2019-09-21 MED ORDER — HEMOSTATIC AGENTS (NO CHARGE) OPTIME
TOPICAL | Status: DC | PRN
  Administered 2019-09-21: 1 via TOPICAL

## 2019-09-21 MED ORDER — SODIUM CHLORIDE 0.9% FLUSH: 3.0000 mL | INTRAVENOUS | Status: DC | PRN

## 2019-09-21 MED ORDER — PANTOPRAZOLE SODIUM 40 MG IV SOLR: 40.0000 mg | Freq: Every day | INTRAVENOUS | Status: DC

## 2019-09-21 MED ORDER — SODIUM CHLORIDE 0.9 % IV SOLN: 250.0000 mL | INTRAVENOUS | Status: DC

## 2019-09-21 MED ORDER — DEXAMETHASONE SODIUM PHOSPHATE 10 MG/ML IJ SOLN
INTRAMUSCULAR | Status: DC | PRN
  Administered 2019-09-21: 4 mg via INTRAVENOUS

## 2019-09-21 MED ORDER — ADULT MULTIVITAMIN W/MINERALS CH
1.0000 | ORAL_TABLET | Freq: Every day | ORAL | Status: DC
  Administered 2019-09-22: 1 via ORAL
  Filled 2019-09-21: qty 1

## 2019-09-21 MED ORDER — PROPOFOL 10 MG/ML IV BOLUS
INTRAVENOUS | Status: DC | PRN
  Administered 2019-09-21: 120 mg via INTRAVENOUS

## 2019-09-21 MED ORDER — AMLODIPINE BESYLATE 5 MG PO TABS
5.0000 mg | ORAL_TABLET | Freq: Every day | ORAL | Status: DC
  Administered 2019-09-22: 5 mg via ORAL
  Filled 2019-09-21: qty 1

## 2019-09-21 MED ORDER — METFORMIN HCL ER 500 MG PO TB24
1000.0000 mg | ORAL_TABLET | Freq: Every day | ORAL | Status: DC
  Administered 2019-09-22: 1000 mg via ORAL
  Filled 2019-09-21: qty 2

## 2019-09-21 MED ORDER — HYDROCHLOROTHIAZIDE 25 MG PO TABS
25.0000 mg | ORAL_TABLET | Freq: Every day | ORAL | Status: DC
  Administered 2019-09-22: 25 mg via ORAL
  Filled 2019-09-21: qty 1

## 2019-09-21 MED ORDER — ONDANSETRON HCL 4 MG/2ML IJ SOLN: 4.0000 mg | Freq: Once | INTRAMUSCULAR | Status: DC | PRN

## 2019-09-21 MED ORDER — SUCCINYLCHOLINE CHLORIDE 200 MG/10ML IV SOSY
PREFILLED_SYRINGE | INTRAVENOUS | Status: AC
  Filled 2019-09-21: qty 10

## 2019-09-21 MED ORDER — DEXAMETHASONE SODIUM PHOSPHATE 10 MG/ML IJ SOLN
INTRAMUSCULAR | Status: AC
  Filled 2019-09-21: qty 2

## 2019-09-21 MED ORDER — PHENYLEPHRINE HCL (PRESSORS) 10 MG/ML IV SOLN
INTRAVENOUS | Status: DC | PRN
  Administered 2019-09-21 (×2): 40 ug via INTRAVENOUS
  Administered 2019-09-21: 80 ug via INTRAVENOUS

## 2019-09-21 MED ORDER — SODIUM CHLORIDE 0.9% FLUSH
3.0000 mL | Freq: Two times a day (BID) | INTRAVENOUS | Status: DC
  Administered 2019-09-21: 3 mL via INTRAVENOUS

## 2019-09-21 MED ORDER — 0.9 % SODIUM CHLORIDE (POUR BTL) OPTIME
TOPICAL | Status: DC | PRN
  Administered 2019-09-21 (×3): 1000 mL

## 2019-09-21 MED ORDER — HYDROMORPHONE HCL 1 MG/ML IJ SOLN: 0.2500 mg | INTRAMUSCULAR | Status: DC | PRN

## 2019-09-21 MED ORDER — PHENOL 1.4 % MT LIQD
1.0000 | OROMUCOSAL | Status: DC | PRN
  Administered 2019-09-21: 1 via OROMUCOSAL

## 2019-09-21 MED ORDER — ROCURONIUM BROMIDE 10 MG/ML (PF) SYRINGE
PREFILLED_SYRINGE | INTRAVENOUS | Status: DC | PRN
  Administered 2019-09-21: 50 mg via INTRAVENOUS
  Administered 2019-09-21: 20 mg via INTRAVENOUS

## 2019-09-21 MED ORDER — DOCUSATE SODIUM 100 MG PO CAPS
100.0000 mg | ORAL_CAPSULE | Freq: Two times a day (BID) | ORAL | Status: DC
  Administered 2019-09-21 – 2019-09-22 (×2): 100 mg via ORAL
  Filled 2019-09-21 (×2): qty 1

## 2019-09-21 MED ORDER — CEFAZOLIN SODIUM-DEXTROSE 2-4 GM/100ML-% IV SOLN
2.0000 g | INTRAVENOUS | Status: AC
  Administered 2019-09-21: 2 g via INTRAVENOUS
  Filled 2019-09-21: qty 100

## 2019-09-21 MED ORDER — ACETAMINOPHEN 650 MG RE SUPP: 650.0000 mg | RECTAL | Status: DC | PRN

## 2019-09-21 MED ORDER — PANTOPRAZOLE SODIUM 40 MG PO TBEC
40.0000 mg | DELAYED_RELEASE_TABLET | Freq: Every day | ORAL | Status: DC
  Administered 2019-09-21: 40 mg via ORAL
  Filled 2019-09-21: qty 1

## 2019-09-21 MED ORDER — ONDANSETRON HCL 4 MG PO TABS: 4.0000 mg | ORAL_TABLET | Freq: Four times a day (QID) | ORAL | Status: DC | PRN

## 2019-09-21 MED ORDER — MENTHOL 3 MG MT LOZG: 1.0000 | LOZENGE | OROMUCOSAL | Status: DC | PRN

## 2019-09-21 MED ORDER — LIDOCAINE 2% (20 MG/ML) 5 ML SYRINGE
INTRAMUSCULAR | Status: DC | PRN
  Administered 2019-09-21: 100 mg via INTRAVENOUS

## 2019-09-21 MED ORDER — FENTANYL CITRATE (PF) 250 MCG/5ML IJ SOLN
INTRAMUSCULAR | Status: DC | PRN
  Administered 2019-09-21 (×5): 50 ug via INTRAVENOUS

## 2019-09-21 MED ORDER — BUPIVACAINE-EPINEPHRINE 0.25% -1:200000 IJ SOLN
INTRAMUSCULAR | Status: DC | PRN
  Administered 2019-09-21: 5 mL

## 2019-09-21 MED ORDER — MIDAZOLAM HCL 5 MG/5ML IJ SOLN
INTRAMUSCULAR | Status: DC | PRN
  Administered 2019-09-21: 1 mg via INTRAVENOUS

## 2019-09-21 MED ORDER — LACTATED RINGERS IV SOLN: INTRAVENOUS | Status: DC

## 2019-09-21 MED ORDER — CHLORHEXIDINE GLUCONATE 0.12 % MT SOLN
15.0000 mL | Freq: Once | OROMUCOSAL | Status: AC
  Administered 2019-09-21: 15 mL via OROMUCOSAL
  Filled 2019-09-21: qty 15

## 2019-09-21 MED ORDER — SENNOSIDES-DOCUSATE SODIUM 8.6-50 MG PO TABS: 1.0000 | ORAL_TABLET | Freq: Every evening | ORAL | Status: DC | PRN

## 2019-09-21 MED ORDER — MIDAZOLAM HCL 2 MG/2ML IJ SOLN
INTRAMUSCULAR | Status: AC
  Filled 2019-09-21: qty 2

## 2019-09-21 MED ORDER — ONDANSETRON HCL 4 MG/2ML IJ SOLN
INTRAMUSCULAR | Status: AC
  Filled 2019-09-21: qty 4

## 2019-09-21 MED ORDER — MIDAZOLAM HCL 5 MG/5ML IJ SOLN: INTRAMUSCULAR | Status: DC | PRN

## 2019-09-21 MED ORDER — HYDROMORPHONE HCL 1 MG/ML IJ SOLN
INTRAMUSCULAR | Status: AC
  Filled 2019-09-21: qty 1

## 2019-09-21 MED ORDER — MEPERIDINE HCL 25 MG/ML IJ SOLN: 6.2500 mg | INTRAMUSCULAR | Status: DC | PRN

## 2019-09-21 MED ORDER — PHENYLEPHRINE 40 MCG/ML (10ML) SYRINGE FOR IV PUSH (FOR BLOOD PRESSURE SUPPORT)
PREFILLED_SYRINGE | INTRAVENOUS | Status: AC
  Filled 2019-09-21: qty 10

## 2019-09-21 MED ORDER — THROMBIN 20000 UNITS EX SOLR
CUTANEOUS | Status: AC
  Filled 2019-09-21: qty 20000

## 2019-09-21 MED ORDER — POTASSIUM CHLORIDE CRYS ER 20 MEQ PO TBCR
20.0000 meq | EXTENDED_RELEASE_TABLET | Freq: Every day | ORAL | Status: DC
  Administered 2019-09-21 – 2019-09-22 (×2): 20 meq via ORAL
  Filled 2019-09-21 (×2): qty 1

## 2019-09-21 MED ORDER — METHOCARBAMOL 500 MG PO TABS
500.0000 mg | ORAL_TABLET | Freq: Four times a day (QID) | ORAL | Status: DC | PRN
  Administered 2019-09-21 – 2019-09-22 (×3): 500 mg via ORAL
  Filled 2019-09-21 (×3): qty 1

## 2019-09-21 MED ORDER — CEFAZOLIN SODIUM-DEXTROSE 2-4 GM/100ML-% IV SOLN
2.0000 g | Freq: Three times a day (TID) | INTRAVENOUS | Status: AC
  Administered 2019-09-21 – 2019-09-22 (×2): 2 g via INTRAVENOUS
  Filled 2019-09-21: qty 100

## 2019-09-21 MED ORDER — ATORVASTATIN CALCIUM 10 MG PO TABS
10.0000 mg | ORAL_TABLET | Freq: Every day | ORAL | Status: DC
  Administered 2019-09-21: 10 mg via ORAL
  Filled 2019-09-21: qty 1

## 2019-09-21 SURGICAL SUPPLY — 93 items
AGENT HMST KT MTR STRL THRMB (HEMOSTASIS) ×1
AGENT HMST MTR 8 SURGIFLO (HEMOSTASIS)
APL SKNCLS STERI-STRIP NONHPOA (GAUZE/BANDAGES/DRESSINGS) ×1
BENZOIN TINCTURE PRP APPL 2/3 (GAUZE/BANDAGES/DRESSINGS) ×3 IMPLANT
BIT DRILL NEURO 2X3.1 SFT TUCH (MISCELLANEOUS) ×1 IMPLANT
BIT DRILL SKYLINE 12MM (BIT) IMPLANT
BIT DRILL SRG 14X2.2XFLT CHK (BIT) IMPLANT
BIT DRL SRG 14X2.2XFLT CHK (BIT) ×1
BLADE CLIPPER SURG (BLADE) ×1 IMPLANT
BLADE SURG 15 STRL LF DISP TIS (BLADE) ×1 IMPLANT
BLADE SURG 15 STRL SS (BLADE) ×3
BONE VIVIGEN FORMABLE 1.3CC (Bone Implant) ×6 IMPLANT
BUR MATCHSTICK NEURO 3.0 LAGG (BURR) IMPLANT
CARTRIDGE OIL MAESTRO DRILL (MISCELLANEOUS) ×1 IMPLANT
CLOSURE STERI-STRIP 1/2X4 (GAUZE/BANDAGES/DRESSINGS) ×1
CLOSURE WOUND 1/2 X4 (GAUZE/BANDAGES/DRESSINGS) ×1
CLSR STERI-STRIP ANTIMIC 1/2X4 (GAUZE/BANDAGES/DRESSINGS) ×1 IMPLANT
COVER SURGICAL LIGHT HANDLE (MISCELLANEOUS) ×1 IMPLANT
COVER WAND RF STERILE (DRAPES) ×3 IMPLANT
DECANTER SPIKE VIAL GLASS SM (MISCELLANEOUS) ×3 IMPLANT
DEVICE ENDSKLTN CRVCL 5MM-0SM (Orthopedic Implant) IMPLANT
DIFFUSER DRILL AIR PNEUMATIC (MISCELLANEOUS) ×3 IMPLANT
DRAIN JACKSON RD 7FR 3/32 (WOUND CARE) IMPLANT
DRAPE C-ARM 42X72 X-RAY (DRAPES) ×3 IMPLANT
DRAPE POUCH INSTRU U-SHP 10X18 (DRAPES) ×3 IMPLANT
DRAPE SURG 17X23 STRL (DRAPES) ×9 IMPLANT
DRILL BIT SKYLINE 12MM (BIT) ×3
DRILL BIT SKYLINE 14MM (BIT) ×3
DRILL NEURO 2X3.1 SOFT TOUCH (MISCELLANEOUS) ×3
DURAPREP 26ML APPLICATOR (WOUND CARE) ×3 IMPLANT
ELECT COATED BLADE 2.86 ST (ELECTRODE) ×3 IMPLANT
ELECT REM PT RETURN 9FT ADLT (ELECTROSURGICAL) ×3
ELECTRODE REM PT RTRN 9FT ADLT (ELECTROSURGICAL) ×1 IMPLANT
ENDOSKELETON CERVICAL 5MM-0SM (Orthopedic Implant) ×3 IMPLANT
EVACUATOR SILICONE 100CC (DRAIN) IMPLANT
GAUZE 4X4 16PLY RFD (DISPOSABLE) ×3 IMPLANT
GAUZE SPONGE 4X4 12PLY STRL (GAUZE/BANDAGES/DRESSINGS) ×3 IMPLANT
GAUZE SPONGE 4X4 12PLY STRL LF (GAUZE/BANDAGES/DRESSINGS) ×2 IMPLANT
GLOVE BIO SURGEON STRL SZ 6.5 (GLOVE) ×5 IMPLANT
GLOVE BIO SURGEON STRL SZ7 (GLOVE) ×3 IMPLANT
GLOVE BIO SURGEON STRL SZ8 (GLOVE) ×3 IMPLANT
GLOVE BIO SURGEONS STRL SZ 6.5 (GLOVE) ×5
GLOVE BIOGEL PI IND STRL 6.5 (GLOVE) IMPLANT
GLOVE BIOGEL PI IND STRL 7.0 (GLOVE) ×2 IMPLANT
GLOVE BIOGEL PI IND STRL 8 (GLOVE) ×1 IMPLANT
GLOVE BIOGEL PI INDICATOR 6.5 (GLOVE) ×6
GLOVE BIOGEL PI INDICATOR 7.0 (GLOVE) ×4
GLOVE BIOGEL PI INDICATOR 8 (GLOVE) ×2
GLOVE SURG SS PI 6.0 STRL IVOR (GLOVE) ×4 IMPLANT
GOWN STRL REUS W/ TWL LRG LVL3 (GOWN DISPOSABLE) ×1 IMPLANT
GOWN STRL REUS W/ TWL XL LVL3 (GOWN DISPOSABLE) ×1 IMPLANT
GOWN STRL REUS W/TWL LRG LVL3 (GOWN DISPOSABLE) ×3
GOWN STRL REUS W/TWL XL LVL3 (GOWN DISPOSABLE) ×3
GRAFT BNE MATRIX VG FRMBL SM 1 (Bone Implant) IMPLANT
INTERLOCK LRDTC CRVCL VBR 6MM (Bone Implant) IMPLANT
IV CATH 14GX2 1/4 (CATHETERS) ×3 IMPLANT
KIT BASIN OR (CUSTOM PROCEDURE TRAY) ×3 IMPLANT
KIT TURNOVER KIT B (KITS) ×3 IMPLANT
LORDOTIC CERVICAL VBR 6MM SM (Bone Implant) ×6 IMPLANT
MANIFOLD NEPTUNE II (INSTRUMENTS) ×1 IMPLANT
NDL PRECISIONGLIDE 27X1.5 (NEEDLE) ×1 IMPLANT
NDL SPNL 20GX3.5 QUINCKE YW (NEEDLE) ×1 IMPLANT
NEEDLE PRECISIONGLIDE 27X1.5 (NEEDLE) ×3 IMPLANT
NEEDLE SPNL 20GX3.5 QUINCKE YW (NEEDLE) ×3 IMPLANT
NS IRRIG 1000ML POUR BTL (IV SOLUTION) ×7 IMPLANT
OIL CARTRIDGE MAESTRO DRILL (MISCELLANEOUS) ×3
PACK ORTHO CERVICAL (CUSTOM PROCEDURE TRAY) ×3 IMPLANT
PAD ARMBOARD 7.5X6 YLW CONV (MISCELLANEOUS) ×6 IMPLANT
PATTIES SURGICAL .5 X.5 (GAUZE/BANDAGES/DRESSINGS) IMPLANT
PATTIES SURGICAL .5 X1 (DISPOSABLE) IMPLANT
PIN DISTRACTION 14 (PIN) ×4 IMPLANT
PLATE SKYLINE 3LVL 42MM (Plate) ×2 IMPLANT
POSITIONER HEAD DONUT 9IN (MISCELLANEOUS) ×3 IMPLANT
SCREW SKYLINE VAR OS 14MM (Screw) ×12 IMPLANT
SCREW SKYLINE VARIABLE LG (Screw) ×4 IMPLANT
SPOGE SURGIFLO 8M (HEMOSTASIS)
SPONGE INTESTINAL PEANUT (DISPOSABLE) ×6 IMPLANT
SPONGE SURGIFLO 8M (HEMOSTASIS) IMPLANT
SPONGE SURGIFOAM ABS GEL 100 (HEMOSTASIS) ×3 IMPLANT
STRIP CLOSURE SKIN 1/2X4 (GAUZE/BANDAGES/DRESSINGS) ×2 IMPLANT
SURGIFLO W/THROMBIN 8M KIT (HEMOSTASIS) ×2 IMPLANT
SUT MNCRL AB 4-0 PS2 18 (SUTURE) ×3 IMPLANT
SUT VIC AB 2-0 CT2 18 VCP726D (SUTURE) ×3 IMPLANT
SYR BULB IRRIG 60ML STRL (SYRINGE) ×3 IMPLANT
SYR CONTROL 10ML LL (SYRINGE) ×4 IMPLANT
TAPE CLOTH 4X10 WHT NS (GAUZE/BANDAGES/DRESSINGS) ×1 IMPLANT
TAPE CLOTH SURG 4X10 WHT LF (GAUZE/BANDAGES/DRESSINGS) ×2 IMPLANT
TAPE UMBILICAL COTTON 1/8X30 (MISCELLANEOUS) ×3 IMPLANT
TOWEL GREEN STERILE (TOWEL DISPOSABLE) ×3 IMPLANT
TOWEL GREEN STERILE FF (TOWEL DISPOSABLE) ×3 IMPLANT
TRAY FOLEY MTR SLVR 16FR STAT (SET/KITS/TRAYS/PACK) ×1 IMPLANT
WATER STERILE IRR 1000ML POUR (IV SOLUTION) ×3 IMPLANT
YANKAUER SUCT BULB TIP NO VENT (SUCTIONS) ×3 IMPLANT

## 2019-09-21 NOTE — Op Note (Signed)
PATIENT NAME: [name]   MEDICAL RECORD NO.:   [id number]    DATE OF BIRTH: 09-03-1940   DATE OF PROCEDURE: 09/21/2019                               OPERATIVE REPORT     PREOPERATIVE DIAGNOSES: 1. Left-sided cervical radiculopathy 2. Severe spinal cord compression spanning C3-C6    POSTOPERATIVE DIAGNOSES: 1. Left-sided cervical radiculopathy 2. Severe spinal cord compression spanning C3-C6    PROCEDURE: 1. Complex anterior cervical decompression and fusion C3/4 C4/5, C5/6 2. Placement of anterior instrumentation, C3-C6. 3. Insertion of interbody device x 3 (Titan intervertebral spacers). 4. Intraoperative use of fluoroscopy. 5. Use of morselized allograft - ViviGen.   SURGEON:  Phylliss Bob, MD   ASSISTANT:  Pricilla Holm, PA-C.   ANESTHESIA:  General endotracheal anesthesia.   COMPLICATIONS:  None.   DISPOSITION:  Stable.   ESTIMATED BLOOD LOSS:  Minimal.   INDICATIONS FOR SURGERY:  Briefly, [name] is a pleasant 79 y.o. year- old patient, who did present to me with severe pain in the neck and left arm, also with left arm weakness.  The patient's MRI did reveal severe spinal cord compression, as noted above.  Given the patient's ongoing rather debilitating pain and MRI findings, we did discuss proceeding with the procedure noted above.  The patient was fully aware of the risks and limitations of surgery as outlined in my preoperative note.   OPERATIVE DETAILS:  On 09/21/2019  the patient was brought to surgery and general endotracheal anesthesia was administered.  The patient was placed supine on the hospital bed. The neck was gently extended.  All bony prominences were meticulously padded.  The neck was prepped and draped in the usual sterile fashion.  At this point, I did make a left-sided transverse incision.  The platysma was incised.  A Smith-Robinson approach was used and the anterior spine was identified. A self-retaining retractor was placed.  I then  subperiosteally exposed the vertebral bodies from C3-C6.  Caspar pins were then placed into the C5 and C6 vertebral bodies and distraction was applied.  A thorough and complete C5-6 intervertebral diskectomy was performed.  Of note, this decompression was extremely meticulous, taking substantially more time and effort than typical.  This was due to the fact that the region of the left hemicord, there was a prominent disc osteophyte complex, entirely adherent to the anterior spinal cord.  I did make every attempt to develop a safe plane between the osteophyte complex and the dura immediately posterior to it.  I did spend an additional 30 minutes attempting this maneuver, but it then became clear that the disc osteophyte complex was entirely adherent to the dura overlying the left hemicord.  I did feel that additional attempts at removing the disc osteophyte complex, would bring with a substantial risk of injury to the spinal cord, or a durotomy.  I did however ensure a neuroforaminal decompression on the left side, given the patient's left arm pain.  After preparing the endplates, the appropriate-sized intervertebral spacer was then packed with ViviGen and tamped into position in the usual fashion.    I did advance the intervertebral implant to the region of the posterior margin of the vertebral bodies, in order to optimize the decompression of the exiting left C6 nerve. The lower Caspar pin was then removed and placed into the C4 vertebral body and once again, distraction was applied  across the C4-5 intervertebral space.  I then again performed a thorough and complete diskectomy, thoroughly decompressing the spinal canal and bilateral neuroforamena.   Once again, this discectomy was extremely complex and meticulous, requiring approximately 30 minutes longer than normal, as it was again noted that it discussed effect complex was entirely adherent to the anterior dura.  I was however able to develop a plane  between the disc osteophyte complex and the anterior spinal cord at the mid aspect of the intervertebral space.  The dura did appear slightly attenuated at the right aspect of the spinal canal, however, there was no extravasation of cerebrospinal fluid noted throughout the discectomy.  Once the decompression was completed, I did place a small piece of Gelfoam over the region of the anterior dura. After preparing the endplates, the appropriate-sized intervertebral spacer was packed with ViviGen and tamped into position.  The lower Caspar pin was then removed and placed into the C3 vertebral body and once again, distraction was applied across the C3-4 intervertebral space.  I then again performed a thorough and complete diskectomy, thoroughly decompressing the spinal canal and bilateral neuroforamena.  After preparing the endplates, the appropriate-sized intervertebral spacer was packed with ViviGen and tamped into position.  The Caspar pins then were removed and bone wax was placed in their place.  The appropriate-sized anterior cervical plate was placed over the anterior spine.  14 mm variable angle screws were placed, 2 in each vertebral body from C3-C6 for a total of 8 vertebral body screws.  The screws were then locked to the plate using the Cam locking mechanism.  I was very pleased with the final fluoroscopic images.  The wound was then irrigated.  The wound was then explored for any undue bleeding and there was no bleeding noted. The wound was then closed in layers using 2-0 Vicryl, followed by 4-0 Monocryl.  Benzoin and Steri-Strips were applied, followed by sterile dressing.  All instrument counts were correct at the termination of the procedure.   Of note, Pricilla Holm, PA-C, was my assistant throughout surgery, and did aid in retraction, suctioning, placement of the hardware, and closure from start to finish.      Phylliss Bob, MD

## 2019-09-21 NOTE — Transfer of Care (Signed)
Immediate Anesthesia Transfer of Care Note  Patient: [name]  Procedure(s) Performed: ANTERIOR CERVICAL DECOMPRESSION FUSION CERVICAL THREE-FOUR, CERVICAL FOUR-FIVE, CERVICAL FIVE-SIX WITH INSTRUMENTATION AND ALLOGRAFT (N/A Spine Cervical)  Patient Location: PACU  Anesthesia Type:General  Level of Consciousness: awake and alert   Airway & Oxygen Therapy: Patient Spontanous Breathing and Patient connected to nasal cannula oxygen  Post-op Assessment: Report given to RN and Post -op Vital signs reviewed and stable  Post vital signs: Reviewed and stable  Last Vitals:  Vitals Value Taken Time  BP 127/74   Temp    Pulse 79 09/21/19 1400  Resp 9   SpO2 100 % 09/21/19 1400  Vitals shown include unvalidated device data.  Last Pain:  Vitals:   09/21/19 0904  TempSrc:   PainSc: 0-No pain         Complications: No complications documented.

## 2019-09-21 NOTE — Anesthesia Procedure Notes (Signed)
Procedure Name: Intubation Date/Time: 09/21/2019 10:22 AM Performed by: Wilburn Cornelia, CRNA Pre-anesthesia Checklist: Patient identified, Emergency Drugs available, Suction available, Patient being monitored and Timeout performed Patient Re-evaluated:Patient Re-evaluated prior to induction Oxygen Delivery Method: Circle system utilized Preoxygenation: Pre-oxygenation with 100% oxygen Induction Type: IV induction Ventilation: Mask ventilation without difficulty and Oral airway inserted - appropriate to patient size Laryngoscope Size: Mac and 3 Grade View: Grade I Tube type: Oral Tube size: 7.0 mm Number of attempts: 1 Airway Equipment and Method: Stylet Placement Confirmation: ETT inserted through vocal cords under direct vision,  positive ETCO2,  CO2 detector and breath sounds checked- equal and bilateral Secured at: 21 cm Tube secured with: Tape Dental Injury: Teeth and Oropharynx as per pre-operative assessment

## 2019-09-21 NOTE — Anesthesia Preprocedure Evaluation (Addendum)
Anesthesia Evaluation  Patient identified by MRN, date of birth, ID band Patient awake    Reviewed: Allergy & Precautions, NPO status , Patient's Chart, lab work & pertinent test results  History of Anesthesia Complications Negative for: history of anesthetic complications  Airway Mallampati: II  TM Distance: >3 FB Neck ROM: Full    Dental  (+) Edentulous Upper, Edentulous Lower   Pulmonary neg pulmonary ROS,    Pulmonary exam normal        Cardiovascular hypertension, Pt. on medications + Valvular Problems/Murmurs  Rhythm:Regular + Systolic murmurs Echo 1103 - Left ventricle: The cavity size was normal. There was moderate concentric hypertrophy. Systolic function was vigorous. The estimated ejection fraction was in the range of 65% to 70%. Wall motion was normal; there were no regional wall motion abnormalities. Doppler parameters are consistent with abnormal left ventricular relaxation (grade 1 diastolic dysfunction).  - Aortic valve: There was trivial regurgitation.  - Tricuspid valve: There was mild-moderate regurgitation.  - Pulmonary arteries: Systolic pressure was within the normal range.     Neuro/Psych negative neurological ROS  negative psych ROS   GI/Hepatic negative GI ROS,  Elevated LFTs    Endo/Other  diabetes, Type 2 Hypokalemia Hypomagnesemia Hypophosphatemia   Renal/GU Renal disease     Musculoskeletal negative musculoskeletal ROS (+)   Abdominal   Peds  Hematology  (+) anemia ,   Anesthesia Other Findings   Reproductive/Obstetrics                            Anesthesia Physical  Anesthesia Plan  ASA: III  Anesthesia Plan: General   Post-op Pain Management:    Induction: Intravenous  PONV Risk Score and Plan: 4 or greater and Treatment may vary due to age or medical condition, Ondansetron and Dexamethasone  Airway Management Planned: Oral  ETT  Additional Equipment: None  Intra-op Plan:   Post-operative Plan: Extubation in OR  Informed Consent: I have reviewed the patients History and Physical, chart, labs and discussed the procedure including the risks, benefits and alternatives for the proposed anesthesia with the patient or authorized representative who has indicated his/her understanding and acceptance.     Dental advisory given  Plan Discussed with: CRNA  Anesthesia Plan Comments:        Anesthesia Quick Evaluation

## 2019-09-21 NOTE — H&P (Signed)
PREOPERATIVE H&P  Chief Complaint: Left arm pain  HPI: [name] is a 79 y.o. female who presents with ongoing left arm pain and weakness  MRI reveals substantial spinal cord compression spanning C3-C6  Patient has failed multiple forms of conservative care and continues to have pain (see office notes for additional details regarding the patient's full course of treatment)  Past Medical History:  Diagnosis Date  . Acute pancreatitis 02/22/2019  . 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 Fibroids   . Gallstones    incidental asymptomatic gallstones  . Heart murmur   . Hyperlipidemia   . Hypertension    Past Surgical History:  Procedure Laterality Date  . CHOLECYSTECTOMY N/A 02/24/2019   Procedure: LAPAROSCOPIC CHOLECYSTECTOMY;  Surgeon: Ileana Roup, MD;  Location: [facility];  Service: General;  Laterality: N/A;  . NO PAST SURGERIES     Social History   Socioeconomic History  . Marital status: Widowed    Spouse name: Not on file  . Number of children: Not on file  . Years of education: Not on file  . Highest education level: Not on file  Occupational History  . Not on file  Tobacco Use  . Smoking status: Never Smoker  . Smokeless tobacco: Never Used  Vaping Use  . Vaping Use: Never used  Substance and Sexual Activity  . Alcohol use: No    Alcohol/week: 0.0 standard drinks  . Drug use: No  . Sexual activity: Not Currently  Other Topics Concern  . Not on file  Social History Narrative  . Not on file   Social Determinants of Health   Financial Resource Strain: Low Risk   . Difficulty of Paying Living Expenses: Not hard at all  Food Insecurity:   . Worried About Charity fundraiser in the Last Year:   . Arboriculturist in the Last Year:   Transportation Needs:   . Film/video editor (Medical):   Marland Kitchen Lack of Transportation (Non-Medical):   Physical Activity:   . Days of Exercise per Week:   . Minutes of Exercise per Session:   Stress:   .  Feeling of Stress :   Social Connections:   . Frequency of Communication with Friends and Family:   . Frequency of Social Gatherings with Friends and Family:   . Attends Religious Services:   . Active Member of Clubs or Organizations:   . Attends Archivist Meetings:   Marland Kitchen Marital Status:    Family History  Problem Relation Age of Onset  . Hypertension Mother   . Diabetes Mother   . Osteoporosis Sister   . Diabetes Brother    Allergies  Allergen Reactions  . Ibuprofen Palpitations  . Sulfamethoxazole-Trimethoprim Rash   Prior to Admission medications   Medication Sig Start Date End Date Taking? Authorizing Provider  acetaminophen (TYLENOL) 325 MG tablet Take 325-650 mg by mouth every 6 (six) hours as needed for mild pain or moderate pain.   Yes [provider]  amLODipine (NORVASC) 5 MG tablet Take 1 tablet (5 mg total) by mouth daily. 05/09/19  Yes Tower, Wynelle Fanny, MD  aspirin 81 MG tablet Take 81 mg by mouth daily.   Yes [provider]  cholecalciferol (VITAMIN D) 1000 UNITS tablet Take 1,000 Units by mouth daily.   Yes [provider]  Emollient (EUCERIN) lotion Apply 1 Bottle topically daily as needed for dry skin.   Yes [provider]  gabapentin (NEURONTIN) 100 MG capsule Take 1-2 capsules (100-200 mg total) by mouth at bedtime. As needed for neck and arm pain Patient taking differently: Take 100-200 mg by mouth at bedtime as needed (Neck and arm pain).  07/28/19  Yes Tower, Wynelle Fanny, MD  metFORMIN (GLUCOPHAGE-XR) 500 MG 24 hr tablet Take 2 tablets (1,000 mg total) by mouth daily with breakfast. 07/28/19  Yes Tower, Wynelle Fanny, MD  Multiple Vitamin (MULTIVITAMIN) tablet Take 1 tablet by mouth daily.     Yes [provider]  Polyethyl Glycol-Propyl Glycol (SYSTANE FREE OP) Place 2 drops into both eyes daily.   Yes [provider]  ACCU-CHEK AVIVA PLUS test strip USE TO CHECK BLOOD SUGAR ONCE DALIY (DX. E11.9) 07/18/19    Tower, Wynelle Fanny, MD  ACCU-CHEK FASTCLIX LANCETS MISC Check glucose once daily (Dx. E11.9) 06/21/17   Tower, Wynelle Fanny, MD  Accu-Chek Softclix Lancets lancets To check glucose daily and as needed for diabetes type 2 05/09/19   Tower, Wynelle Fanny, MD  atorvastatin (LIPITOR) 10 MG tablet TAKE 1 TABLET (10 MG TOTAL) BY MOUTH DAILY. IN [facility] 09/13/19   Tower, Wynelle Fanny, MD  Blood Glucose Monitoring Suppl (ACCU-CHEK AVIVA PLUS) w/Device KIT Use to check glucose once daily for DM 2 (Dx. E11.9) 01/23/19   Tower, Wynelle Fanny, MD  hydrochlorothiazide (HYDRODIURIL) 25 MG tablet TAKE 1 TABLET BY MOUTH EVERY DAY IN THE MORNING 09/13/19   Tower, Wynelle Fanny, MD  KLOR-CON M20 20 MEQ tablet TAKE 1 TABLET BY MOUTH EVERY DAY 09/13/19   Tower, Wynelle Fanny, MD  lisinopril (ZESTRIL) 40 MG tablet TAKE 1 TABLET BY MOUTH EVERY DAY 09/13/19   Tower, Wynelle Fanny, MD     All other systems have been reviewed and were otherwise negative with the exception of those mentioned in the HPI and as above.  Physical Exam: There were no vitals filed for this visit.  There is no height or weight on file to calculate BMI.  General: Alert, no acute distress Cardiovascular: No pedal edema Respiratory: No cyanosis, no use of accessory musculature Skin: No lesions in the area of chief complaint Neurologic: Sensation intact distally Psychiatric: Patient is competent for consent with normal mood and affect Lymphatic: No axillary or cervical lymphadenopathy   Assessment/Plan: SPINAL CORD COMPRESSION AND LEFT ARM PAIN  Plan for Procedure(s): ANTERIOR CERVICAL DECOMPRESSION FUSION CERVICAL 3-4, CERVICAL 4-5, CERVICAL 5-6 WITH INSTRUMENTATION AND ALLOGRAFT   Norva Karvonen, MD 09/21/2019 6:44 AM

## 2019-09-21 NOTE — Anesthesia Postprocedure Evaluation (Signed)
Anesthesia Post Note  Patient: [name]  Procedure(s) Performed: ANTERIOR CERVICAL DECOMPRESSION FUSION CERVICAL THREE-FOUR, CERVICAL FOUR-FIVE, CERVICAL FIVE-SIX WITH INSTRUMENTATION AND ALLOGRAFT (N/A Spine Cervical)     Patient location during evaluation: PACU Anesthesia Type: General Level of consciousness: sedated and patient cooperative Pain management: pain level controlled Vital Signs Assessment: post-procedure vital signs reviewed and stable Respiratory status: spontaneous breathing Cardiovascular status: stable Anesthetic complications: no   No complications documented.  Last Vitals:  Vitals:   09/21/19 1430 09/21/19 1445  BP: (!) 93/53 (!) 98/55  Pulse: 63 64  Resp: 19 17  Temp:    SpO2: 100% 99%    Last Pain:  Vitals:   09/21/19 0904  TempSrc:   PainSc: 0-No pain                 Nolon Nations

## 2019-09-22 ENCOUNTER — Encounter (HOSPITAL_COMMUNITY): Payer: Self-pay | Admitting: Orthopedic Surgery

## 2019-09-22 DIAGNOSIS — Z7982 Long term (current) use of aspirin: Secondary | ICD-10-CM | POA: Diagnosis not present

## 2019-09-22 DIAGNOSIS — M5412 Radiculopathy, cervical region: Secondary | ICD-10-CM | POA: Diagnosis not present

## 2019-09-22 DIAGNOSIS — G959 Disease of spinal cord, unspecified: Secondary | ICD-10-CM | POA: Diagnosis not present

## 2019-09-22 DIAGNOSIS — Z7984 Long term (current) use of oral hypoglycemic drugs: Secondary | ICD-10-CM | POA: Diagnosis not present

## 2019-09-22 DIAGNOSIS — E119 Type 2 diabetes mellitus without complications: Secondary | ICD-10-CM | POA: Diagnosis not present

## 2019-09-22 DIAGNOSIS — I10 Essential (primary) hypertension: Secondary | ICD-10-CM | POA: Diagnosis not present

## 2019-09-22 DIAGNOSIS — Z79899 Other long term (current) drug therapy: Secondary | ICD-10-CM | POA: Diagnosis not present

## 2019-09-22 DIAGNOSIS — G9520 Unspecified cord compression: Secondary | ICD-10-CM | POA: Diagnosis not present

## 2019-09-22 LAB — GLUCOSE, CAPILLARY: Glucose-Capillary: 92 mg/dL (ref 70–99)

## 2019-09-22 NOTE — Progress Notes (Signed)
Patient is discharged from room 3C05 at this time. Alert and in stable condition. IV site d/c'd and instructions read to patient with understanding verbalized and all questions answered. Left unit via wheelchair with all belongings at side.  

## 2019-09-22 NOTE — Progress Notes (Signed)
    Patient doing well  Denies arm pain Tolerating PO well   Physical Exam: Vitals:   09/22/19 0347 09/22/19 0710  BP: (!) 118/58 (!) 123/54  Pulse: 66 66  Resp: 18 18  Temp: 98.3 F (36.8 C) (!) 97.5 F (36.4 C)  SpO2: 100% 100%    Neck soft/supple Dressing in place NVI  POD #1 s/p ACDF, doing well  - encourage ambulation - Percocet for pain, Robaxin for muscle spasms - d/c home today with f/u in 2 weeks

## 2019-09-24 DIAGNOSIS — I10 Essential (primary) hypertension: Secondary | ICD-10-CM | POA: Diagnosis not present

## 2019-09-24 DIAGNOSIS — Z7984 Long term (current) use of oral hypoglycemic drugs: Secondary | ICD-10-CM | POA: Diagnosis not present

## 2019-09-24 DIAGNOSIS — Z4789 Encounter for other orthopedic aftercare: Secondary | ICD-10-CM | POA: Diagnosis not present

## 2019-09-24 DIAGNOSIS — E119 Type 2 diabetes mellitus without complications: Secondary | ICD-10-CM | POA: Diagnosis not present

## 2019-09-24 DIAGNOSIS — R2681 Unsteadiness on feet: Secondary | ICD-10-CM | POA: Diagnosis not present

## 2019-09-26 DIAGNOSIS — E119 Type 2 diabetes mellitus without complications: Secondary | ICD-10-CM | POA: Diagnosis not present

## 2019-09-26 DIAGNOSIS — Z4789 Encounter for other orthopedic aftercare: Secondary | ICD-10-CM | POA: Diagnosis not present

## 2019-09-26 DIAGNOSIS — Z7984 Long term (current) use of oral hypoglycemic drugs: Secondary | ICD-10-CM | POA: Diagnosis not present

## 2019-09-26 DIAGNOSIS — I10 Essential (primary) hypertension: Secondary | ICD-10-CM | POA: Diagnosis not present

## 2019-09-26 DIAGNOSIS — R2681 Unsteadiness on feet: Secondary | ICD-10-CM | POA: Diagnosis not present

## 2019-09-27 DIAGNOSIS — E119 Type 2 diabetes mellitus without complications: Secondary | ICD-10-CM | POA: Diagnosis not present

## 2019-09-27 DIAGNOSIS — Z7984 Long term (current) use of oral hypoglycemic drugs: Secondary | ICD-10-CM | POA: Diagnosis not present

## 2019-09-27 DIAGNOSIS — R2681 Unsteadiness on feet: Secondary | ICD-10-CM | POA: Diagnosis not present

## 2019-09-27 DIAGNOSIS — Z4789 Encounter for other orthopedic aftercare: Secondary | ICD-10-CM | POA: Diagnosis not present

## 2019-09-27 DIAGNOSIS — I10 Essential (primary) hypertension: Secondary | ICD-10-CM | POA: Diagnosis not present

## 2019-09-27 NOTE — Discharge Summary (Signed)
Patient ID: [name] MRN: [id number] DOB/AGE: 1940/10/22 79 y.o.  Admit date: 09/21/2019 Discharge date: 09/22/2019  Admission Diagnoses:  Active Problems:   Myelopathy [facility])   Discharge Diagnoses:  Same  Past Medical History:  Diagnosis Date  . Acute pancreatitis 02/22/2019  . 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 Fibroids   . Gallstones    incidental asymptomatic gallstones  . Heart murmur   . Hyperlipidemia   . Hypertension     Surgeries: Procedure(s): ANTERIOR CERVICAL DECOMPRESSION FUSION CERVICAL THREE-FOUR, CERVICAL FOUR-FIVE, CERVICAL FIVE-SIX WITH INSTRUMENTATION AND ALLOGRAFT on 09/21/2019   Consultants: None  Discharged Condition: Improved  Hospital Course: [name] is an 79 y.o. female who was admitted 09/21/2019 for operative treatment of myelopathy. Patient has severe unremitting pain that affects sleep, daily activities, and work/hobbies. After pre-op clearance the patient was taken to the operating room on 09/21/2019 and underwent  Procedure(s): ANTERIOR CERVICAL DECOMPRESSION FUSION CERVICAL THREE-FOUR, CERVICAL FOUR-FIVE, CERVICAL FIVE-SIX WITH INSTRUMENTATION AND ALLOGRAFT.    Patient was given perioperative antibiotics:  Anti-infectives (From admission, onward)   Start     Dose/Rate Route Frequency Ordered Stop   09/21/19 1830  ceFAZolin (ANCEF) IVPB 2g/100 mL premix        2 g 200 mL/hr over 30 Minutes Intravenous Every 8 hours 09/21/19 1530 09/22/19 1028   09/21/19 0830  ceFAZolin (ANCEF) IVPB 2g/100 mL premix        2 g 200 mL/hr over 30 Minutes Intravenous On call to O.R. 09/21/19 6606 09/21/19 1100       Patient was given sequential compression devices, early ambulation to prevent DVT.  Patient benefited maximally from hospital stay and there were no complications.    Recent vital signs: BP (!) 123/54 (BP Location: Left Arm)   Pulse 66   Temp (!) 97.5 F (36.4 C) (Oral)   Resp 18   Ht 5' (1.524 m)   Wt 59.9 kg   SpO2 100%    BMI 25.78 kg/m    Discharge Medications:   Allergies as of 09/22/2019      Reactions   Ibuprofen Palpitations   Sulfamethoxazole-trimethoprim Rash      Medication List    STOP taking these medications   acetaminophen 325 MG tablet Commonly known as: TYLENOL     TAKE these medications   Accu-Chek Aviva Plus test strip Generic drug: glucose blood USE TO CHECK BLOOD SUGAR ONCE DALIY (DX. E11.9)   Accu-Chek Aviva Plus w/Device Kit Use to check glucose once daily for DM 2 (Dx. E11.9)   Accu-Chek FastClix Lancets Misc Check glucose once daily (Dx. E11.9)   Accu-Chek Softclix Lancets lancets To check glucose daily and as needed for diabetes type 2   amLODipine 5 MG tablet Commonly known as: NORVASC Take 1 tablet (5 mg total) by mouth daily.   atorvastatin 10 MG tablet Commonly known as: LIPITOR TAKE 1 TABLET (10 MG TOTAL) BY MOUTH DAILY. IN EVENING   cholecalciferol 1000 units tablet Commonly known as: VITAMIN D Take 1,000 Units by mouth daily.   eucerin lotion Apply 1 Bottle topically daily as needed for dry skin.   gabapentin 100 MG capsule Commonly known as: NEURONTIN Take 1-2 capsules (100-200 mg total) by mouth at bedtime. As needed for neck and arm pain What changed:   when to take this  reasons to take this  additional instructions   hydrochlorothiazide 25 MG tablet Commonly known as: HYDRODIURIL TAKE 1 TABLET BY MOUTH EVERY DAY IN  THE MORNING   Klor-Con M20 20 MEQ tablet Generic drug: potassium chloride SA TAKE 1 TABLET BY MOUTH EVERY DAY   lisinopril 40 MG tablet Commonly known as: ZESTRIL TAKE 1 TABLET BY MOUTH EVERY DAY   metFORMIN 500 MG 24 hr tablet Commonly known as: GLUCOPHAGE-XR Take 2 tablets (1,000 mg total) by mouth daily with breakfast.   methocarbamol 500 MG tablet Commonly known as: ROBAXIN Take 1 tablet (500 mg total) by mouth every 6 (six) hours as needed for muscle spasms.   multivitamin tablet Take 1 tablet by mouth  daily.   oxyCODONE-acetaminophen 5-325 MG tablet Commonly known as: PERCOCET/ROXICET Take 1-2 tablets by mouth every 4 (four) hours as needed for moderate pain or severe pain.   SYSTANE FREE OP Place 2 drops into both eyes daily.       Diagnostic Studies: DG Cervical Spine 2-3 Views  Result Date: 09/21/2019 CLINICAL DATA:  Cervical fusion EXAM: CERVICAL SPINE - 2-3 VIEW; DG C-ARM 1-60 MIN COMPARISON:  07/28/2019 FINDINGS: A single C-arm fluoroscopic image of the lateral cervical spine was obtained intraoperatively and submitted for post operative interpretation. Interval placement of C3-C6 ACDF hardware which appears well seated. Endotracheal tube is visualized. 8 seconds of fluoroscopy time was utilized. Please see the performing provider's procedural report for further detail. IMPRESSION: As above. Electronically Signed   By: Nicholas  Plundo D.O.   On: 09/21/2019 14:04   DG C-Arm 1-60 Min  Result Date: 09/21/2019 CLINICAL DATA:  Cervical fusion EXAM: CERVICAL SPINE - 2-3 VIEW; DG C-ARM 1-60 MIN COMPARISON:  07/28/2019 FINDINGS: A single C-arm fluoroscopic image of the lateral cervical spine was obtained intraoperatively and submitted for post operative interpretation. Interval placement of C3-C6 ACDF hardware which appears well seated. Endotracheal tube is visualized. 8 seconds of fluoroscopy time was utilized. Please see the performing provider's procedural report for further detail. IMPRESSION: As above. Electronically Signed   By: Nicholas  Plundo D.O.   On: 09/21/2019 14:04    Disposition: Discharge disposition: 01-Home or Self Care        POD #1 s/p ACDF, doing well  - encourage ambulation - Percocet for pain, Robaxin for muscle spasms -Scripts for pain sent to pharmacy electronically  -D/C instructions sheet printed and in chart -D/C today  -F/U in office 2 weeks   Signed: Kayla J McKenzie 09/27/2019, 11:44 AM       

## 2019-10-02 DIAGNOSIS — I10 Essential (primary) hypertension: Secondary | ICD-10-CM | POA: Diagnosis not present

## 2019-10-02 DIAGNOSIS — Z4789 Encounter for other orthopedic aftercare: Secondary | ICD-10-CM | POA: Diagnosis not present

## 2019-10-02 DIAGNOSIS — E119 Type 2 diabetes mellitus without complications: Secondary | ICD-10-CM | POA: Diagnosis not present

## 2019-10-02 DIAGNOSIS — R2681 Unsteadiness on feet: Secondary | ICD-10-CM | POA: Diagnosis not present

## 2019-10-02 DIAGNOSIS — Z7984 Long term (current) use of oral hypoglycemic drugs: Secondary | ICD-10-CM | POA: Diagnosis not present

## 2019-10-03 ENCOUNTER — Encounter (HOSPITAL_COMMUNITY): Payer: Self-pay | Admitting: Orthopedic Surgery

## 2019-10-03 ENCOUNTER — Ambulatory Visit: Payer: Medicare Other | Admitting: Family Medicine

## 2019-10-04 DIAGNOSIS — R2681 Unsteadiness on feet: Secondary | ICD-10-CM | POA: Diagnosis not present

## 2019-10-04 DIAGNOSIS — E119 Type 2 diabetes mellitus without complications: Secondary | ICD-10-CM | POA: Diagnosis not present

## 2019-10-04 DIAGNOSIS — Z7984 Long term (current) use of oral hypoglycemic drugs: Secondary | ICD-10-CM | POA: Diagnosis not present

## 2019-10-04 DIAGNOSIS — I10 Essential (primary) hypertension: Secondary | ICD-10-CM | POA: Diagnosis not present

## 2019-10-04 DIAGNOSIS — Z9889 Other specified postprocedural states: Secondary | ICD-10-CM | POA: Diagnosis not present

## 2019-10-04 DIAGNOSIS — Z4789 Encounter for other orthopedic aftercare: Secondary | ICD-10-CM | POA: Diagnosis not present

## 2019-10-06 DIAGNOSIS — Z4789 Encounter for other orthopedic aftercare: Secondary | ICD-10-CM | POA: Diagnosis not present

## 2019-10-06 DIAGNOSIS — E119 Type 2 diabetes mellitus without complications: Secondary | ICD-10-CM | POA: Diagnosis not present

## 2019-10-06 DIAGNOSIS — R2681 Unsteadiness on feet: Secondary | ICD-10-CM | POA: Diagnosis not present

## 2019-10-06 DIAGNOSIS — Z7984 Long term (current) use of oral hypoglycemic drugs: Secondary | ICD-10-CM | POA: Diagnosis not present

## 2019-10-06 DIAGNOSIS — I10 Essential (primary) hypertension: Secondary | ICD-10-CM | POA: Diagnosis not present

## 2019-10-17 DIAGNOSIS — Z981 Arthrodesis status: Secondary | ICD-10-CM | POA: Diagnosis not present

## 2019-10-17 DIAGNOSIS — M5412 Radiculopathy, cervical region: Secondary | ICD-10-CM | POA: Diagnosis not present

## 2019-11-03 ENCOUNTER — Other Ambulatory Visit: Payer: Self-pay

## 2019-11-03 ENCOUNTER — Ambulatory Visit (INDEPENDENT_AMBULATORY_CARE_PROVIDER_SITE_OTHER): Payer: Medicare Other | Admitting: Family Medicine

## 2019-11-03 VITALS — BP 128/74 | HR 82 | Temp 96.3°F | Ht 60.0 in | Wt 124.0 lb

## 2019-11-03 DIAGNOSIS — E119 Type 2 diabetes mellitus without complications: Secondary | ICD-10-CM | POA: Diagnosis not present

## 2019-11-03 DIAGNOSIS — E1169 Type 2 diabetes mellitus with other specified complication: Secondary | ICD-10-CM | POA: Diagnosis not present

## 2019-11-03 DIAGNOSIS — Z23 Encounter for immunization: Secondary | ICD-10-CM

## 2019-11-03 DIAGNOSIS — Z1159 Encounter for screening for other viral diseases: Secondary | ICD-10-CM

## 2019-11-03 DIAGNOSIS — E876 Hypokalemia: Secondary | ICD-10-CM | POA: Diagnosis not present

## 2019-11-03 DIAGNOSIS — E785 Hyperlipidemia, unspecified: Secondary | ICD-10-CM

## 2019-11-03 DIAGNOSIS — Z9889 Other specified postprocedural states: Secondary | ICD-10-CM | POA: Diagnosis not present

## 2019-11-03 DIAGNOSIS — Z9189 Other specified personal risk factors, not elsewhere classified: Secondary | ICD-10-CM

## 2019-11-03 DIAGNOSIS — M5412 Radiculopathy, cervical region: Secondary | ICD-10-CM

## 2019-11-03 DIAGNOSIS — I10 Essential (primary) hypertension: Secondary | ICD-10-CM

## 2019-11-03 MED ORDER — POTASSIUM CHLORIDE 20 MEQ PO PACK: 20.0000 meq | PACK | Freq: Every day | ORAL | 5 refills | Status: DC

## 2019-11-03 NOTE — Patient Instructions (Addendum)
Since you cannot take the potassium pill- we can switch to the packet (dissolve in fluid)  [facility]  If any problems let me know   Let's check a potassium level in a month   Keep healing and take care of yourself   Flu shot today

## 2019-11-03 NOTE — Progress Notes (Signed)
Subjective:    Patient ID: [name], female    DOB: 1940/12/30, 79 y.o.   MRN: [id number]  This visit occurred during the SARS-CoV-2 public health emergency.  Safety protocols were in place, including screening questions prior to the visit, additional usage of staff PPE, and extensive cleaning of exam room while observing appropriate contact time as indicated for disinfecting solutions.    HPI Pt presents for f/u of chronic health problems   Wt Readings from Last 3 Encounters:  11/03/19 124 lb (56.2 kg)  09/21/19 132 lb (59.9 kg)  09/15/19 132 lb 4 oz (60 kg)   24.22 kg/m  Appetite is starting to come back  She lost weight  Swallowing is improving since cervical surg- eating soft foods    Had neck surgery- ant cerv decompression/fusion  She did very well  Had trouble swallowing for a while after   She is not taking her K and vitamins  Cutting some other medicines in 1/2    HTN bp is stable today  No cp or palpitations or headaches or edema  No side effects to medicines  BP Readings from Last 3 Encounters:  11/03/19 128/74  09/22/19 (!) 123/54  09/15/19 (!) 133/63     Pulse Readings from Last 3 Encounters:  11/03/19 82  09/22/19 66  09/15/19 68   She is walking for exercise regularly  Had PT also-exercises   Taking amlodipine and hctz and lisinopril Also K  Lab Results  Component Value Date   CREATININE 1.00 09/15/2019   BUN 11 09/15/2019   NA 137 09/15/2019   K 3.2 (L) 09/15/2019   CL 97 (L) 09/15/2019   CO2 28 09/15/2019   DM2 a1c was up in may and then back down again in July Lab Results  Component Value Date   HGBA1C 6.2 (H) 09/15/2019   Taking metformin  Taking ace Also statin  Had eye exam in January  Diet-eating less Being careful with sweets  Fasting usually 90s to low 100s   No low readings   Hyperlipidemia Lab Results  Component Value Date   CHOL 182 06/27/2019   HDL 75.30 06/27/2019   LDLCALC 87 06/27/2019   LDLDIRECT  111.2 02/28/2013   TRIG 97.0 06/27/2019   CHOLHDL 2 06/27/2019  controlled with atorvastatin and diet    Patient Active Problem List   Diagnosis Date Noted  . Myelopathy ([place]) 09/21/2019  . Pre-operative cardiovascular examination 09/05/2019  . Belching 04/07/2019  . History of cholecystectomy 03/06/2019  . History of pancreatitis 02/21/2019  . Left hand paresthesia 12/27/2018  . Elevated TSH 06/28/2018  . Right leg pain 09/13/2015  . Routine general medical examination at a health care facility 04/05/2015  . Estrogen deficiency 04/05/2015  . Hypokalemia 09/04/2013  . Nonspecific abnormal electrocardiogram (ECG) (EKG) 08/30/2013  . Encounter for Medicare annual wellness exam 03/07/2013  . Fullness of supraclavicular fossa 02/27/2011  . Other screening mammogram 02/25/2011  . Gynecological examination 02/25/2011  . Cervical radiculopathy 03/31/2010  . DM type 2 (diabetes mellitus, type 2) ([place]) 07/06/2007  . FIBROIDS, UTERUS 03/23/2007  . Hyperlipidemia associated with type 2 diabetes mellitus ([place]) 03/23/2007  . Essential hypertension 03/23/2007  . POSTMENOPAUSAL STATUS 03/23/2007   Past Medical History:  Diagnosis Date  . Acute pancreatitis 02/22/2019  . 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 Fibroids   . Gallstones    incidental asymptomatic gallstones  . Heart murmur   . Hyperlipidemia   . Hypertension  Past Surgical History:  Procedure Laterality Date  . ANTERIOR CERVICAL DECOMPRESSION/DISCECTOMY FUSION 4 LEVELS N/A 09/21/2019   Procedure: ANTERIOR CERVICAL DECOMPRESSION FUSION CERVICAL THREE-FOUR, CERVICAL FOUR-FIVE, CERVICAL FIVE-SIX WITH INSTRUMENTATION AND ALLOGRAFT;  Surgeon: Phylliss Bob, MD;  Location: [facility];  Service: Orthopedics;  Laterality: N/A;  . CHOLECYSTECTOMY N/A 02/24/2019   Procedure: LAPAROSCOPIC CHOLECYSTECTOMY;  Surgeon: Ileana Roup, MD;  Location: [facility];  Service: General;  Laterality: N/A;  . NO PAST SURGERIES     Social History    Tobacco Use  . Smoking status: Never Smoker  . Smokeless tobacco: Never Used  Vaping Use  . Vaping Use: Never used  Substance Use Topics  . Alcohol use: No    Alcohol/week: 0.0 standard drinks  . Drug use: No   Family History  Problem Relation Age of Onset  . Hypertension Mother   . Diabetes Mother   . Osteoporosis Sister   . Diabetes Brother    Allergies  Allergen Reactions  . Ibuprofen Palpitations  . Sulfamethoxazole-Trimethoprim Rash   Current Outpatient Medications on File Prior to Visit  Medication Sig Dispense Refill  . ACCU-CHEK AVIVA PLUS test strip USE TO CHECK BLOOD SUGAR ONCE DALIY (DX. E11.9) 50 strip 2  . ACCU-CHEK FASTCLIX LANCETS MISC Check glucose once daily (Dx. E11.9) 102 each 1  . Accu-Chek Softclix Lancets lancets To check glucose daily and as needed for diabetes type 2 100 each 5  . atorvastatin (LIPITOR) 10 MG tablet TAKE 1 TABLET (10 MG TOTAL) BY MOUTH DAILY. IN EVENING 90 tablet 0  . Blood Glucose Monitoring Suppl (ACCU-CHEK AVIVA PLUS) w/Device KIT Use to check glucose once daily for DM 2 (Dx. E11.9) 1 kit 0  . cholecalciferol (VITAMIN D) 1000 UNITS tablet Take 1,000 Units by mouth daily.    . Emollient (EUCERIN) lotion Apply 1 Bottle topically daily as needed for dry skin.    . hydrochlorothiazide (HYDRODIURIL) 25 MG tablet TAKE 1 TABLET BY MOUTH EVERY DAY IN THE MORNING 90 tablet 0  . lisinopril (ZESTRIL) 40 MG tablet TAKE 1 TABLET BY MOUTH EVERY DAY 90 tablet 0  . metFORMIN (GLUCOPHAGE-XR) 500 MG 24 hr tablet Take 2 tablets (1,000 mg total) by mouth daily with breakfast. 60 tablet 5  . methocarbamol (ROBAXIN) 500 MG tablet Take 1 tablet (500 mg total) by mouth every 6 (six) hours as needed for muscle spasms. 30 tablet 1  . Polyethyl Glycol-Propyl Glycol (SYSTANE FREE OP) Place 2 drops into both eyes daily.    Marland Kitchen amLODipine (NORVASC) 5 MG tablet Take 1 tablet (5 mg total) by mouth daily. 90 tablet 3  . gabapentin (NEURONTIN) 100 MG capsule Take 1-2  capsules (100-200 mg total) by mouth at bedtime. As needed for neck and arm pain (Patient not taking: Reported on 11/03/2019) 60 capsule 3  . Multiple Vitamin (MULTIVITAMIN) tablet Take 1 tablet by mouth daily.   (Patient not taking: Reported on 11/03/2019)     No current facility-administered medications on file prior to visit.     Review of Systems  Constitutional: Negative for activity change, appetite change, fatigue, fever and unexpected weight change.  HENT: Positive for trouble swallowing. Negative for congestion, ear pain, rhinorrhea, sinus pressure and sore throat.   Eyes: Negative for pain, redness and visual disturbance.  Respiratory: Negative for cough, shortness of breath and wheezing.   Cardiovascular: Negative for chest pain and palpitations.  Gastrointestinal: Negative for abdominal pain, blood in stool, constipation and diarrhea.  Endocrine: Negative for polydipsia and  polyuria.  Genitourinary: Negative for dysuria, frequency and urgency.  Musculoskeletal: Negative for arthralgias, back pain and myalgias.  Skin: Negative for pallor and rash.  Allergic/Immunologic: Negative for environmental allergies.  Neurological: Negative for dizziness, syncope and headaches.  Hematological: Negative for adenopathy. Does not bruise/bleed easily.  Psychiatric/Behavioral: Negative for decreased concentration and dysphoric mood. The patient is not nervous/anxious.        Objective:   Physical Exam Constitutional:      General: She is not in acute distress.    Appearance: Normal appearance. She is well-developed and normal weight. She is not ill-appearing or diaphoretic.  HENT:     Head: Normocephalic and atraumatic.  Eyes:     Conjunctiva/sclera: Conjunctivae normal.     Pupils: Pupils are equal, round, and reactive to light.  Neck:     Thyroid: No thyromegaly.     Vascular: No JVD.     Comments: In soft collar today Cardiovascular:     Rate and Rhythm: Normal rate and regular  rhythm.     Heart sounds: Normal heart sounds. No gallop.   Pulmonary:     Effort: Pulmonary effort is normal. No respiratory distress.     Breath sounds: Normal breath sounds. No wheezing or rales.  Abdominal:     General: Bowel sounds are normal. There is no distension or abdominal bruit.     Palpations: Abdomen is soft. There is no mass.     Tenderness: There is no abdominal tenderness.  Musculoskeletal:     Cervical back: Normal range of motion and neck supple.     Right lower leg: No edema.     Left lower leg: No edema.  Skin:    General: Skin is warm and dry.     Findings: No rash.  Neurological:     Mental Status: She is alert.     Sensory: No sensory deficit.     Coordination: Coordination normal.     Deep Tendon Reflexes: Reflexes are normal and symmetric. Reflexes normal.  Psychiatric:        Mood and Affect: Mood normal.           Assessment & Plan:   Problem List Items Addressed This Visit      Cardiovascular and Mediastinum   Essential hypertension    bp in fair control at this time  BP Readings from Last 1 Encounters:  11/03/19 128/74   No changes needed Most recent labs reviewed  Disc lifstyle change with low sodium diet and exercise  Takes amlodipine and hctz  Will need to change K to liquid formula since swallowing trouble since neck surgery        Endocrine   Hyperlipidemia associated with type 2 diabetes mellitus ([place])    Disc goals for lipids and reasons to control them Rev last labs with pt Rev low sat fat diet in detail  Controlled with atorvastatin and diet      DM type 2 (diabetes mellitus, type 2) ([place]) - Primary    Doing well  Last a1c improved Lab Results  Component Value Date   HGBA1C 6.2 (H) 09/15/2019   Taking metformin and ace and statin  Nl eye exam in jan Nl foot exam  Eating is improved also         Nervous and Auditory   Cervical radiculopathy    Much improved since anterior cervical surgery Still on soft  collar  Some swallowing problems-grad improved  Other   Hypokalemia    Since anterior cervical surgery pt has trouble swallowing large pills Will change K to packet/dissolvable  inst to start back  Planned K check in a month      Relevant Orders   Potassium    Other Visit Diagnoses    Encounter for hepatitis C virus screening test for high risk patient       Need for immunization against influenza       Relevant Orders   Flu Vaccine QUAD High Dose(Fluad) (Completed)

## 2019-11-04 ENCOUNTER — Encounter: Payer: Self-pay | Admitting: Family Medicine

## 2019-11-04 NOTE — Assessment & Plan Note (Signed)
Since anterior cervical surgery pt has trouble swallowing large pills Will change K to packet/dissolvable  inst to start back  Planned K check in a month

## 2019-11-04 NOTE — Assessment & Plan Note (Signed)
Disc goals for lipids and reasons to control them Rev last labs with pt Rev low sat fat diet in detail  Controlled with atorvastatin and diet

## 2019-11-04 NOTE — Assessment & Plan Note (Signed)
Much improved since anterior cervical surgery Still on soft collar  Some swallowing problems-grad improved

## 2019-11-04 NOTE — Assessment & Plan Note (Signed)
bp in fair control at this time  BP Readings from Last 1 Encounters:  11/03/19 128/74   No changes needed Most recent labs reviewed  Disc lifstyle change with low sodium diet and exercise  Takes amlodipine and hctz  Will need to change K to liquid formula since swallowing trouble since neck surgery

## 2019-11-04 NOTE — Assessment & Plan Note (Signed)
Doing well  Last a1c improved Lab Results  Component Value Date   HGBA1C 6.2 (H) 09/15/2019   Taking metformin and ace and statin  Nl eye exam in jan Nl foot exam  Eating is improved also

## 2019-12-04 ENCOUNTER — Other Ambulatory Visit (INDEPENDENT_AMBULATORY_CARE_PROVIDER_SITE_OTHER): Payer: Medicare Other

## 2019-12-04 ENCOUNTER — Other Ambulatory Visit: Payer: Self-pay

## 2019-12-04 ENCOUNTER — Other Ambulatory Visit: Payer: Self-pay | Admitting: Family Medicine

## 2019-12-04 DIAGNOSIS — E876 Hypokalemia: Secondary | ICD-10-CM

## 2019-12-04 LAB — POTASSIUM: Potassium: 3.5 mEq/L (ref 3.5–5.1)

## 2019-12-14 ENCOUNTER — Other Ambulatory Visit: Payer: Self-pay | Admitting: Family Medicine

## 2019-12-15 DIAGNOSIS — Z9889 Other specified postprocedural states: Secondary | ICD-10-CM | POA: Diagnosis not present

## 2020-01-22 DIAGNOSIS — H26491 Other secondary cataract, right eye: Secondary | ICD-10-CM | POA: Diagnosis not present

## 2020-01-22 LAB — HM DIABETES EYE EXAM

## 2020-01-25 ENCOUNTER — Encounter: Payer: Self-pay | Admitting: Family Medicine

## 2020-01-27 ENCOUNTER — Other Ambulatory Visit: Payer: Self-pay | Admitting: Family Medicine

## 2020-02-23 ENCOUNTER — Ambulatory Visit
Admission: EM | Admit: 2020-02-23 | Discharge: 2020-02-23 | Disposition: A | Discharge status: Home / Self Care | Payer: Medicare Other | Attending: Family Medicine | Admitting: Family Medicine

## 2020-02-23 ENCOUNTER — Telehealth: Payer: Self-pay

## 2020-02-23 DIAGNOSIS — N3001 Acute cystitis with hematuria: Secondary | ICD-10-CM | POA: Diagnosis not present

## 2020-02-23 LAB — POCT URINALYSIS DIP (MANUAL ENTRY)
Bilirubin, UA: NEGATIVE
Glucose, UA: NEGATIVE mg/dL
Ketones, POC UA: NEGATIVE mg/dL
Nitrite, UA: POSITIVE — AB
Protein Ur, POC: NEGATIVE mg/dL
Spec Grav, UA: 1.015 (ref 1.010–1.025)
Urobilinogen, UA: 0.2 E.U./dL
pH, UA: 6.5 (ref 5.0–8.0)

## 2020-02-23 MED ORDER — CEPHALEXIN 500 MG PO CAPS
500.0000 mg | ORAL_CAPSULE | Freq: Two times a day (BID) | ORAL | 0 refills | Status: AC
Start: 2020-02-23 — End: 2020-02-28

## 2020-02-23 NOTE — Telephone Encounter (Signed)
Aware and will watch for correspondence 

## 2020-02-23 NOTE — ED Triage Notes (Signed)
Patient presents to Urgent Care concerned with possible UTI. She started have increased urine freq, describes it cloudy in color with odor and flank plain x 2-3 days ago. She spoke with PCP and was instructed to come to UC.   Denies fever, abdominal pain, or hematuria.

## 2020-02-23 NOTE — ED Provider Notes (Signed)
[name]    CSN: [id number] Arrival date & time: 02/23/20  1052      History   Chief Complaint Chief Complaint  Patient presents with  . Urinary Tract Infection    HPI [name] is a 81 y.o. female.   Patient is a 80 year old female who presents today with urinary frequency, cloudy urine, back pain, urinary odor for the past 2 to 3 days.  Concern for urinary tract infection.  Denies any fever, chills, nausea, vomiting.     Past Medical History:  Diagnosis Date  . Acute pancreatitis 02/22/2019  . 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 Fibroids   . Gallstones    incidental asymptomatic gallstones  . Heart murmur   . Hyperlipidemia   . Hypertension     Patient Active Problem List   Diagnosis Date Noted  . Myelopathy ([place]) 09/21/2019  . Pre-operative cardiovascular examination 09/05/2019  . Belching 04/07/2019  . History of cholecystectomy 03/06/2019  . History of pancreatitis 02/21/2019  . Elevated TSH 06/28/2018  . Right leg pain 09/13/2015  . Routine general medical examination at a health care facility 04/05/2015  . Estrogen deficiency 04/05/2015  . Hypokalemia 09/04/2013  . Nonspecific abnormal electrocardiogram (ECG) (EKG) 08/30/2013  . Encounter for Medicare annual wellness exam 03/07/2013  . Fullness of supraclavicular fossa 02/27/2011  . Other screening mammogram 02/25/2011  . Gynecological examination 02/25/2011  . Cervical radiculopathy 03/31/2010  . DM type 2 (diabetes mellitus, type 2) ([place]) 07/06/2007  . FIBROIDS, UTERUS 03/23/2007  . Hyperlipidemia associated with type 2 diabetes mellitus ([place]) 03/23/2007  . Essential hypertension 03/23/2007  . POSTMENOPAUSAL STATUS 03/23/2007    Past Surgical History:  Procedure Laterality Date  . ANTERIOR CERVICAL DECOMPRESSION/DISCECTOMY FUSION 4 LEVELS N/A 09/21/2019   Procedure: ANTERIOR CERVICAL DECOMPRESSION FUSION CERVICAL THREE-FOUR, CERVICAL FOUR-FIVE, CERVICAL FIVE-SIX WITH  INSTRUMENTATION AND ALLOGRAFT;  Surgeon: Phylliss Bob, MD;  Location: [facility];  Service: Orthopedics;  Laterality: N/A;  . CHOLECYSTECTOMY N/A 02/24/2019   Procedure: LAPAROSCOPIC CHOLECYSTECTOMY;  Surgeon: Ileana Roup, MD;  Location: [facility];  Service: General;  Laterality: N/A;  . NO PAST SURGERIES      OB History   No obstetric history on file.      Home Medications    Prior to Admission medications   Medication Sig Start Date End Date Taking? Authorizing Provider  cephALEXin (KEFLEX) 500 MG capsule Take 1 capsule (500 mg total) by mouth 2 (two) times daily for 5 days. 02/23/20 02/28/20 Yes Yianna Tersigni A, NP  metFORMIN (GLUCOPHAGE-XR) 500 MG 24 hr tablet TAKE 2 TABLETS BY MOUTH EVERY DAY WITH BREAKFAST 01/29/20   Tower, Wynelle Fanny, MD  ACCU-CHEK AVIVA PLUS test strip USE TO CHECK BLOOD SUGAR ONCE DALIY (DX. E11.9) 12/05/19   Tower, Wynelle Fanny, MD  ACCU-CHEK FASTCLIX LANCETS MISC Check glucose once daily (Dx. E11.9) 06/21/17   Tower, Wynelle Fanny, MD  Accu-Chek Softclix Lancets lancets To check glucose daily and as needed for diabetes type 2 05/09/19   Tower, Roque Lias A, MD  amLODipine (NORVASC) 5 MG tablet Take 1 tablet (5 mg total) by mouth daily. 05/09/19   Tower, Wynelle Fanny, MD  atorvastatin (LIPITOR) 10 MG tablet TAKE 1 TABLET (10 MG TOTAL) BY MOUTH DAILY. IN [facility] 12/14/19   Tower, Wynelle Fanny, MD  Blood Glucose Monitoring Suppl (ACCU-CHEK AVIVA PLUS) w/Device KIT Use to check glucose once daily for DM 2 (Dx. E11.9) 01/23/19   Tower, Wynelle Fanny, MD  cholecalciferol (VITAMIN D)  1000 UNITS tablet Take 1,000 Units by mouth daily.    [provider]  Emollient (EUCERIN) lotion Apply 1 Bottle topically daily as needed for dry skin.    [provider]  gabapentin (NEURONTIN) 100 MG capsule Take 1-2 capsules (100-200 mg total) by mouth at bedtime. As needed for neck and arm pain Patient not taking: Reported on 11/03/2019 07/28/19   Tower, Wynelle Fanny, MD  hydrochlorothiazide (HYDRODIURIL) 25 MG  tablet TAKE 1 TABLET BY MOUTH EVERY DAY IN THE MORNING 12/14/19   Tower, Wynelle Fanny, MD  KLOR-CON M20 20 MEQ tablet TAKE 1 TABLET BY MOUTH EVERY DAY 12/14/19   Tower, Wynelle Fanny, MD  lisinopril (ZESTRIL) 40 MG tablet TAKE 1 TABLET BY MOUTH EVERY DAY 12/14/19   Tower, Wynelle Fanny, MD  methocarbamol (ROBAXIN) 500 MG tablet Take 1 tablet (500 mg total) by mouth every 6 (six) hours as needed for muscle spasms. 09/21/19   Phylliss Bob, MD  Multiple Vitamin (MULTIVITAMIN) tablet Take 1 tablet by mouth daily.   Patient not taking: Reported on 11/03/2019    [provider]  Polyethyl Glycol-Propyl Glycol (SYSTANE FREE OP) Place 2 drops into both eyes daily.    [provider]  potassium chloride (KLOR-CON) 20 MEQ packet Take 20 mEq by mouth daily. 11/03/19   Tower, Wynelle Fanny, MD    Family History Family History  Problem Relation Age of Onset  . Hypertension Mother   . Diabetes Mother   . Osteoporosis Sister   . Diabetes Brother     Social History Social History   Tobacco Use  . Smoking status: Never Smoker  . Smokeless tobacco: Never Used  Vaping Use  . Vaping Use: Never used  Substance Use Topics  . Alcohol use: No    Alcohol/week: 0.0 standard drinks  . Drug use: No     Allergies   Ibuprofen and Sulfamethoxazole-trimethoprim   Review of Systems Review of Systems   Physical Exam Triage Vital Signs ED Triage Vitals [02/23/20 1112]  Enc Vitals Group     BP 136/72     Pulse Rate 83     Resp 16     Temp (!) 97 F (36.1 C)     Temp Source Temporal     SpO2 98 %     Weight      Height      Head Circumference      Peak Flow      Pain Score      Pain Loc      Pain Edu?      Excl. in [place]?    No data found.  Updated Vital Signs BP 136/72 (BP Location: Right Arm)   Pulse 83   Temp (!) 97 F (36.1 C) (Temporal)   Resp 16   SpO2 98%   Visual Acuity Right Eye Distance:   Left Eye Distance:   Bilateral Distance:    Right Eye Near:   Left Eye Near:     Bilateral Near:     Physical Exam Vitals and nursing note reviewed.  Constitutional:      General: She is not in acute distress.    Appearance: Normal appearance. She is not ill-appearing, toxic-appearing or diaphoretic.  HENT:     Head: Normocephalic.     Nose: Nose normal.  Eyes:     Conjunctiva/sclera: Conjunctivae normal.  Pulmonary:     Effort: Pulmonary effort is normal.  Musculoskeletal:        General: Normal  range of motion.     Cervical back: Normal range of motion.  Skin:    General: Skin is warm and dry.     Findings: No rash.  Neurological:     Mental Status: She is alert.  Psychiatric:        Mood and Affect: Mood normal.      UC Treatments / Results  Labs (all labs ordered are listed, but only abnormal results are displayed) Labs Reviewed  POCT URINALYSIS DIP (MANUAL ENTRY) - Abnormal; Notable for the following components:      Result Value   Clarity, UA cloudy (*)    Blood, UA trace-intact (*)    Nitrite, UA Positive (*)    Leukocytes, UA Large (3+) (*)    All other components within normal limits  URINE CULTURE    EKG   Radiology No results found.  Procedures Procedures (including critical care time)  Medications Ordered in UC Medications - No data to display  Initial Impression / Assessment and Plan / UC Course  I have reviewed the triage vital signs and the nursing notes.  Pertinent labs & imaging results that were available during my care of the patient were reviewed by me and considered in my medical decision making (see chart for details).     Acute cystitis with hematuria.  Urine with large leuks, positive nitrites, trace blood and cloudy clarity Sending for culture.  We will go ahead and treat for urinary tract infection pending culture. Recommended push fluids Follow up as needed for continued or worsening symptoms  Final Clinical Impressions(s) / UC Diagnoses   Final diagnoses:  Acute cystitis with hematuria      Discharge Instructions     You have a urinary tract infection Take the antibiotics as prescribed Drink plenty of water.      ED Prescriptions    Medication Sig Dispense Auth. Provider   cephALEXin (KEFLEX) 500 MG capsule Take 1 capsule (500 mg total) by mouth 2 (two) times daily for 5 days. 10 capsule Loura Halt A, NP     PDMP not reviewed this encounter.   Orvan July, NP 02/23/20 1122

## 2020-02-23 NOTE — Discharge Instructions (Addendum)
You have a urinary tract infection Take the antibiotics as prescribed Drink plenty of water.

## 2020-02-23 NOTE — Telephone Encounter (Signed)
Per chart review pt is at [facility] in [place].

## 2020-02-23 NOTE — Telephone Encounter (Signed)
[place] Day - Client TELEPHONE ADVICE RECORD [facility] Patient Name: [name] Gender: Female DOB: 07-06-1940 Age: 80 Y 10 M 10 D Return Phone Number: [phone] (Primary), [phone] (Secondary) [place] [address] Client [place] [facility] - Client Client Site [facility] Physician Glori Bickers, Roque Lias - MD Contact Type Call Who Is Calling Patient / Member / Family / Caregiver Call Type Triage / Clinical Relationship To Patient Self Return Phone Number [phone] (Secondary) Chief Complaint Blood Sugar Low Reason for Call Symptomatic / Request for [place] states that she is having numbness in her hands and she feels like her potassium level are off. Her sugar is 87, low for her. Frequent urination. Caller states that she has weakness in her hands, had a cramp last night in her legs. [facility] Translation No Nurse Assessment Nurse: Ysidro Evert, RN, Levada Dy Date/Time Eilene Ghazi Time): 02/23/2020 8:59:59 AM Confirm and document reason for call. If symptomatic, describe symptoms. ---Caller states she is having numbness in her hands that started a while back. She states she is having frequency of urination and some pain. No fever Does the patient have any new or worsening symptoms? ---Yes Will a triage be completed? ---Yes Related visit to physician within the last 2 weeks? ---No Does the PT have any chronic conditions? (i.e. diabetes, asthma, this includes High risk factors for pregnancy, etc.) ---Yes List chronic conditions. ---diabetes, hypertension Is this a behavioral health or substance abuse call? ---No Guidelines Guideline Title Affirmed Question Affirmed Notes Nurse Date/Time Eilene Ghazi Time) Urination Pain - Female Diabetes mellitus or weak immune system (e.g., HIV positive,  cancer chemo, splenectomy, organ transplant, chronic steroids) Ysidro Evert, RN, Levada Dy 02/23/2020 9:02:09 AM Disp. Time Eilene Ghazi Time) Disposition Final User PLEASE NOTE: All timestamps contained within this report are represented as [place] Standard Time. CONFIDENTIALTY NOTICE: This fax transmission is intended only for the addressee. It contains information that is legally privileged, confidential or otherwise protected from use or disclosure. If you are not the intended recipient, you are strictly prohibited from reviewing, disclosing, copying using or disseminating any of this information or taking any action in reliance on or regarding this information. If you have received this fax in error, please notify us immediately by telephone so that we can arrange for its return to [place]. Phone: [phone], Toll-Free: [phone], Fax: [phone] Page: 2 of 2 Call Id: [id number] 02/23/2020 9:06:34 AM See HCP within 4 Hours (or PCP triage) Yes Ysidro Evert, RN, [place] Shutter Disagree/Comply Comply Caller Understands Yes PreDisposition Did not know what to do Care Advice Given Per Guideline SEE HCP (OR PCP TRIAGE) WITHIN 4 HOURS: CALL BACK IF: * You become worse CARE ADVICE given per Urination Pain - Female (Adult) guideline. Referrals GO TO FACILITY OTHER - SPECIFY

## 2020-02-25 LAB — URINE CULTURE: Culture: >=100000 — AB

## 2020-02-27 ENCOUNTER — Telehealth (HOSPITAL_COMMUNITY): Payer: Self-pay

## 2020-02-27 MED ORDER — NITROFURANTOIN MONOHYD MACRO 100 MG PO CAPS: 100.0000 mg | ORAL_CAPSULE | Freq: Two times a day (BID) | ORAL | 0 refills | Status: DC

## 2020-02-29 ENCOUNTER — Other Ambulatory Visit: Payer: Self-pay | Admitting: Family Medicine

## 2020-03-19 ENCOUNTER — Telehealth: Payer: Self-pay

## 2020-03-19 NOTE — Chronic Care Management (AMB) (Addendum)
Chronic Care Management Pharmacy Assistant   Name: [name]  MRN: [id number] DOB: 02-10-1941  Reason for Encounter: CCM follow up appointment reminder   Patient Questions:  1.  Have you seen any other providers since your last visit?  02/23/20 Urgent care visit- UTI 11/03/19- Dr. Loura Graham, PCP- Restarted potassium chloride 20 MEQ daily.  09/21/19- Dr. Phylliss Graham- Orthopedics- Cervical surgery    PCP : Sara Greenspan, MD  Allergies:   Allergies  Allergen Reactions   Ibuprofen Palpitations   Sulfamethoxazole-Trimethoprim Rash    Medications: Outpatient Encounter Medications as of 03/19/2020  Medication Sig   metFORMIN (GLUCOPHAGE-XR) 500 MG 24 hr tablet TAKE 2 TABLETS BY MOUTH EVERY DAY WITH BREAKFAST   ACCU-CHEK AVIVA PLUS test strip USE TO CHECK BLOOD SUGAR ONCE DAILY (DX. E11.9)   ACCU-CHEK FASTCLIX LANCETS MISC Check glucose once daily (Dx. E11.9)   Accu-Chek Softclix Lancets lancets To check glucose daily and as needed for diabetes type 2   amLODipine (NORVASC) 5 MG tablet Take 1 tablet (5 mg total) by mouth daily.   atorvastatin (LIPITOR) 10 MG tablet TAKE 1 TABLET (10 MG TOTAL) BY MOUTH DAILY. IN EVENING   Blood Glucose Monitoring Suppl (ACCU-CHEK AVIVA PLUS) w/Device KIT Use to check glucose once daily for DM 2 (Dx. E11.9)   cholecalciferol (VITAMIN D) 1000 UNITS tablet Take 1,000 Units by mouth daily.   Emollient (EUCERIN) lotion Apply 1 Bottle topically daily as needed for dry skin.   gabapentin (NEURONTIN) 100 MG capsule Take 1-2 capsules (100-200 mg total) by mouth at bedtime. As needed for neck and arm pain (Patient not taking: Reported on 11/03/2019)   hydrochlorothiazide (HYDRODIURIL) 25 MG tablet TAKE 1 TABLET BY MOUTH EVERY DAY IN THE MORNING   KLOR-CON M20 20 MEQ tablet TAKE 1 TABLET BY MOUTH EVERY DAY   lisinopril (ZESTRIL) 40 MG tablet TAKE 1 TABLET BY MOUTH EVERY DAY   methocarbamol (ROBAXIN) 500 MG tablet Take 1 tablet (500 mg total) by mouth every 6  (six) hours as needed for muscle spasms.   Multiple Vitamin (MULTIVITAMIN) tablet Take 1 tablet by mouth daily.   (Patient not taking: Reported on 11/03/2019)   nitrofurantoin, macrocrystal-monohydrate, (MACROBID) 100 MG capsule Take 1 capsule (100 mg total) by mouth 2 (two) times daily.   Polyethyl Glycol-Propyl Glycol (SYSTANE FREE OP) Place 2 drops into both eyes daily.   potassium chloride (KLOR-CON) 20 MEQ packet Take 20 mEq by mouth daily.   No facility-administered encounter medications on file as of 03/19/2020.    Current Diagnosis: Patient Active Problem List   Diagnosis Date Noted   Myelopathy ([place]) 09/21/2019   Pre-operative cardiovascular examination 09/05/2019   Belching 04/07/2019   History of cholecystectomy 03/06/2019   History of pancreatitis 02/21/2019   Elevated TSH 06/28/2018   Right leg pain 09/13/2015   Routine general medical examination at a health care facility 04/05/2015   Estrogen deficiency 04/05/2015   Hypokalemia 09/04/2013   Nonspecific abnormal electrocardiogram (ECG) (EKG) 08/30/2013   Encounter for Medicare annual wellness exam 03/07/2013   Fullness of supraclavicular fossa 02/27/2011   Other screening mammogram 02/25/2011   Gynecological examination 02/25/2011   Cervical radiculopathy 03/31/2010   DM type 2 (diabetes mellitus, type 2) ([place]) 07/06/2007   FIBROIDS, UTERUS 03/23/2007   Hyperlipidemia associated with type 2 diabetes mellitus ([place]) 03/23/2007   Essential hypertension 03/23/2007   POSTMENOPAUSAL STATUS 03/23/2007     Contacted [name] ahead of their visit with CPP, Debbora Dus, Pharm. D  to review any health or medication changes since her last appointment with [name] 09/14/19. Patient denied any changes to health or medications. Does note she had surgery on her neck in August but is doing very well and has been released by the surgeon.   Are you having any problems with your medications? Not at this time.   Any concerns  [name] would like to discuss with the pharmacist? Not at this time.  Patient reminded to have all medications, supplements and any blood sugar and blood pressure readings available for review with Debbora Dus, Pharm. D, at their telephone visit on 03/21/20.     Follow-Up:  Pharmacist Review  Debbora Dus, CPP notified  [name], [place] [phone]  Total time spent for month: 12:14  I have reviewed the care management and care coordination activities outlined in this encounter and I am certifying that I agree with the content of this note. No further action required.  Debbora Dus, PharmD Clinical Pharmacist [facility] at [facility] [phone]

## 2020-03-21 ENCOUNTER — Other Ambulatory Visit: Payer: Self-pay

## 2020-03-21 ENCOUNTER — Ambulatory Visit (INDEPENDENT_AMBULATORY_CARE_PROVIDER_SITE_OTHER): Payer: Medicare Other

## 2020-03-21 DIAGNOSIS — E119 Type 2 diabetes mellitus without complications: Secondary | ICD-10-CM

## 2020-03-21 DIAGNOSIS — I10 Essential (primary) hypertension: Secondary | ICD-10-CM

## 2020-03-21 NOTE — Chronic Care Management (AMB) (Signed)
Chronic Care Management Pharmacy  Name: [name]  MRN: [id number] DOB: [birth year]  Chief Complaint/ HPI  [name],  80 y.o., female presents for their follow up CCM visit with the clinical pharmacist via telephone.  PCP : Abner Greenspan, MD  Their chronic conditions include: HTN, HLD, DM type 2  Office Visits:  11/03/19: PCP visit, Dr. Loura Pardon -  HTN stable today, she had neck surgery, some difficulty swallowing for a while, improving, not taking her potassium or vitamins. Will change k+ formulation, check potassium in 1 month. A1c back down in July. HLD controlled.   09/05/19: PCP visit - pre-operative CV exam, HTN controlled, DM controlled on metformin, likely hold day before surgery, EKG with no changes today, stable cardiac status, hold aspirin 1 week prior to surgery  07/28/19: PCP visit - cervical radiculopathy with worsening left arm and neck pain, try gabapentin 100-200 mg qhs, ice, and OTC Voltaren gel; metformin causing GI effects, try XR  07/03/19: AWV - start metformin 500 mg BID as tolerated, on ACE and statin, last DEXA normal, eye exam up to date, TSH normal,  Lipids fair control   04/07/19: PCP visit - add amlodipine 5 mg   Consult Visit:   02/23/20 Urgent care visit - UTI  09/21/19 - Elective surgery for myelopathy  Allergies  Allergen Reactions  . Ibuprofen Palpitations  . Sulfamethoxazole-Trimethoprim Rash    Current Outpatient Medications on File Prior to Visit  Medication Sig Dispense Refill  . ACCU-CHEK AVIVA PLUS test strip USE TO CHECK BLOOD SUGAR ONCE DAILY (DX. E11.9) 50 strip 1  . ACCU-CHEK FASTCLIX LANCETS MISC Check glucose once daily (Dx. E11.9) 102 each 1  . Accu-Chek Softclix Lancets lancets To check glucose daily and as needed for diabetes type 2 100 each 5  . amLODipine (NORVASC) 5 MG tablet Take 1 tablet (5 mg total) by mouth daily. 90 tablet 3  . atorvastatin (LIPITOR) 10 MG tablet TAKE 1 TABLET (10 MG TOTAL) BY MOUTH DAILY. IN  EVENING 90 tablet 1  . Blood Glucose Monitoring Suppl (ACCU-CHEK AVIVA PLUS) w/Device KIT Use to check glucose once daily for DM 2 (Dx. E11.9) 1 kit 0  . cholecalciferol (VITAMIN D) 1000 UNITS tablet Take 1,000 Units by mouth daily.    . Emollient (EUCERIN) lotion Apply 1 Bottle topically daily as needed for dry skin.    . hydrochlorothiazide (HYDRODIURIL) 25 MG tablet TAKE 1 TABLET BY MOUTH EVERY DAY IN THE MORNING 90 tablet 1  . KLOR-CON M20 20 MEQ tablet TAKE 1 TABLET BY MOUTH EVERY DAY 90 tablet 1  . lisinopril (ZESTRIL) 40 MG tablet TAKE 1 TABLET BY MOUTH EVERY DAY 90 tablet 1  . metFORMIN (GLUCOPHAGE-XR) 500 MG 24 hr tablet TAKE 2 TABLETS BY MOUTH EVERY DAY WITH BREAKFAST 60 tablet 5  . Multiple Vitamin (MULTIVITAMIN) tablet Take 1 tablet by mouth daily.    Vladimir Faster Glycol-Propyl Glycol (SYSTANE FREE OP) Place 2 drops into both eyes daily.     No current facility-administered medications on file prior to visit.   Current Diagnosis/Assessment: SDOH Interventions   Flowsheet Row Most Recent Value  SDOH Interventions   Financial Strain Interventions Intervention Not Indicated  [Medications affordable]      Goals    . Increase physical activity     When schedule permits, I will resume exercise for 30 minutes 2 days per week.     Marland Kitchen Pharmacy Care Plan     CARE PLAN ENTRY (see longitudinal  plan of care for additional care plan information)  Current Barriers:  . Chronic Disease Management support, education, and care coordination needs related to Hypertension and Diabetes   Hypertension BP Readings from Last 3 Encounters:  02/23/20 136/72  11/03/19 128/74  09/22/19 (!) 123/54 .  Pharmacist Clinical Goal(s): o Over the next 6 months, patient will work with PharmD and providers to maintain BP goal <130/80 mmHg . Current regimen:   Hydrochlorothiazide 25 mg - 1 tablet daily  Klor Con 20 mEq - 1 tablet daily  Lisinopril 40 mg - 1 tablet daily  Amlodipine 5 mg - 1 tablet  daily  . Interventions: o Comprehensive medication review o Reviewed home blood pressure monitoring - Patient checks occasionally with arm cuff and BP was 120/69 at last check . Patient self care activities - Over the next 6 months, patient will: . Continue current medications as prescribed . Incorporate a healthy diet high in vegetables, fruits and whole grains with low-fat dairy products, chicken, fish, legumes, non-tropical vegetable oils and nuts. Limit intake of sweets, sugar-sweetened beverages and red meats.  Diabetes Lab Results  Component Value Date/Time   HGBA1C 6.2 (H) 09/15/2019 02:57 PM   HGBA1C 7.0 (H) 06/27/2019 08:07 AM .  Pharmacist Clinical Goal(s): o Over the next 6 months, patient will work with PharmD and providers to achieve A1c goal <7% . Current regimen:  o Metformin 500 mg ER - 2 tablets daily with breakfast . Interventions: o Comprehensive medication review o Reviewed home BG readings - patient checks daily and readings are between 80-100. Denies hypoglycemia or symptoms of hypoglycemia. . Patient self care activities - Over the next 6 months, patient will: o Check blood sugar once daily, document, and provide at future appointments o Contact provider with any episodes of hypoglycemia  Please see past updates related to this goal by clicking on the "Past Updates" button in the selected goal        Hypertension   CMP Latest Ref Rng & Units 12/04/2019 09/15/2019 06/27/2019  Glucose 70 - 99 mg/dL - 109(H) 124(H)  BUN 8 - 23 mg/dL - 11 15  Creatinine 0.44 - 1.00 mg/dL - 1.00 0.97  Sodium 135 - 145 mmol/L - 137 139  Potassium 3.5 - 5.1 mEq/L 3.5 3.2(L) 3.6  Chloride 98 - 111 mmol/L - 97(L) 99  CO2 22 - 32 mmol/L - 28 30  Calcium 8.9 - 10.3 mg/dL - 10.5(H) 10.1  Total Protein 6.5 - 8.1 g/dL - 8.2(H) 7.7  Total Bilirubin 0.3 - 1.2 mg/dL - 0.5 0.4  Alkaline Phos 38 - 126 U/L - 76 82  AST 15 - 41 U/L - 31 23  ALT 0 - 44 U/L - 22 20   Office blood pressures  are: BP Readings from Last 3 Encounters:  02/23/20 136/72  11/03/19 128/74  09/22/19 (!) 123/54   Patient has failed these meds in the past: none reported Patient checks BP at home several times per month (arm cuff)   BP goal < 130/80 mmHg  Patient is currently controlled on the following medications:   Hydrochlorothiazide 25 mg - 1 tablet daily  Klor Con 20 mEq - 1 tablet daily  Lisinopril 40 mg - 1 tablet daily  Amlodipine 5 mg - 1 tablet daily   Update 03/21/20: Pt went back to potassium tablet (from packet) because her swallowing has improved. Removed the packet from med list. Last check of BP at home, 120/69. Checks monthly with arm cuff. Pt reports  the rash we discussed at last visit resolved. Thinks it was related to tomatoes. Refills are timely.   Plan: Continue current medications  Hyperlipidemia   LDL goal < 100  Last lipids Lab Results  Component Value Date   CHOL 182 06/27/2019   HDL 75.30 06/27/2019   LDLCALC 87 06/27/2019   LDLDIRECT 111.2 02/28/2013   TRIG 97.0 06/27/2019   CHOLHDL 2 06/27/2019   Hepatic Function Latest Ref Rng & Units 09/15/2019 06/27/2019 03/28/2019  Total Protein 6.5 - 8.1 g/dL 8.2(H) 7.7 7.9  Albumin 3.5 - 5.0 g/dL 4.4 4.3 4.3  AST 15 - 41 U/L $Remo'31 23 25  'sYqhT$ ALT 0 - 44 U/L $Remo'22 20 23  'OYdqd$ Alk Phosphatase 38 - 126 U/L 76 82 87  Total Bilirubin 0.3 - 1.2 mg/dL 0.5 0.4 0.5  Bilirubin, Direct 0.0 - 0.3 mg/dL - - 0.1    CBC Latest Ref Rng & Units 09/15/2019 06/27/2019 03/28/2019  WBC 4.0 - 10.5 K/uL 7.4 7.7 8.6  Hemoglobin 12.0 - 15.0 g/dL 13.5 12.5 12.9  Hematocrit 36.0 - 46.0 % 41.3 36.8 38.7  Platelets 150 - 400 K/uL 302 291.0 239.0   The 10-year ASCVD risk score Mikey Bussing DC Jr., et al., 2013) is: 40.3%   Values used to calculate the score:     Age: 83 years     Sex: Female     Is Non-Hispanic African American: Yes     Diabetic: Yes     Tobacco smoker: No     Systolic Blood Pressure: 976 mmHg     Is BP treated: Yes     HDL Cholesterol: 75.3  mg/dL     Total Cholesterol: 182 mg/dL   Patient has failed these meds in past: none Patient is currently controlled on the following medications:  . Atorvastatin 10 mg - 1 tablet daily in the evening (takes 9-10 AM) . Aspirin 81 mg - 1 tablet daily (AM)  Update 03/21/20: LDL remains at goal, patient confirms adherence to both medications. She is on aspirin for primary prevention but denies symptoms of bleeding. Appropriate considering high CV risk with diabetes.   Plan: Continue current medications  Diabetes   History of pancreatitis Reports she was not confirmed diabetic until January 2021 Lab Results  Component Value Date   CREATININE 1.00 09/15/2019   BUN 11 09/15/2019   GFR 67.35 06/27/2019   GFRNONAA 54 (L) 09/15/2019   GFRAA >60 09/15/2019   NA 137 09/15/2019   K 3.5 12/04/2019   CALCIUM 10.5 (H) 09/15/2019   CO2 28 09/15/2019    Lab Results  Component Value Date/Time   HGBA1C 6.2 (H) 09/15/2019 02:57 PM   HGBA1C 7.0 (H) 06/27/2019 08:07 AM   Checking BG: Daily, in the morning  A1c < 7% Patient has failed these meds in past: metformin IR   Patient is currently controlled on the following medications:   Metformin 500 mg ER - 2 tablets daily with breakfast  Last diabetic eye exam: up to date Last diabetic foot exam: up to date   Update 03/21/20: Reports checking BG every morning. The last 4 readings --> 99, 89, 94, 85. Denies any BG < 70. Cant remember the last time she had a reading above 100. She reports weight loss following neck surgery, mainly due to difficulty swallowing at first. This has resolved and she thinks she is maintaining weight at this time. She confirms eating well, usually 2-3 meals and snack.  She eats yogurt, fruits, and vegetables and a  lot of baked chicken. Her recent UTI has improved. We discussed current BG levels are lower than last visit but still in safe range as long as she is not having any symptoms. She denies symptoms of hypoglycemia or  falls. We may consider lowering metformin dose following next A1c.   Plan: Continue current medications  Cervical Radiculopathy   Patient has failed these meds in past: gabapentin, Tylenol Patient is currently controlled on the following medications:   None  Updated 03/21/20: No longer needing any pain medications following recent surgery. Denies using any OTCs, including Tylenol.   Plan: Continue current medications  Medication Management   Patient's preferred pharmacy is: [id number] - [place], [place] [address] Phone: [phone] Fax: [phone]  Uses pill box? Yes - puts each medication in box weekly, except the nighttime medication - atorvastatin  Denies any missed doses  Patient denies any cost concerns with her medications or difficulty picking them up from [facility].  We discussed: Discussed benefits of medication synchronization, packaging and delivery as well as enhanced pharmacist oversight with [employer].  Plan  Continue current medication management strategy  Follow up: 6 month phone visit  Debbora Dus, PharmD Clinical Pharmacist [facility] at [facility] [phone]

## 2020-03-21 NOTE — Patient Instructions (Addendum)
Dear [name],  Below is a summary of the goals we discussed during our follow up appointment on March 21, 2020. Please contact me anytime with questions or concerns.   Visit Information  Goals Addressed            This Visit's Progress   . Pharmacy Care Plan       CARE PLAN ENTRY (see longitudinal plan of care for additional care plan information)  Current Barriers:  . Chronic Disease Management support, education, and care coordination needs related to Hypertension and Diabetes   Hypertension BP Readings from Last 3 Encounters:  02/23/20 136/72  11/03/19 128/74  09/22/19 (!) 123/54 .  Pharmacist Clinical Goal(s): o Over the next 6 months, patient will work with PharmD and providers to maintain BP goal <130/80 mmHg . Current regimen:   Hydrochlorothiazide 25 mg - 1 tablet daily  Klor Con 20 mEq - 1 tablet daily  Lisinopril 40 mg - 1 tablet daily  Amlodipine 5 mg - 1 tablet daily  . Interventions: o Comprehensive medication review o Reviewed home blood pressure monitoring - Patient checks occasionally with arm cuff and BP was 120/69 at last check . Patient self care activities - Over the next 6 months, patient will: . Continue current medications as prescribed . Incorporate a healthy diet high in vegetables, fruits and whole grains with low-fat dairy products, chicken, fish, legumes, non-tropical vegetable oils and nuts. Limit intake of sweets, sugar-sweetened beverages and red meats.  Diabetes Lab Results  Component Value Date/Time   HGBA1C 6.2 (H) 09/15/2019 02:57 PM   HGBA1C 7.0 (H) 06/27/2019 08:07 AM .  Pharmacist Clinical Goal(s): o Over the next 6 months, patient will work with PharmD and providers to achieve A1c goal <7% . Current regimen:  o Metformin 500 mg ER - 2 tablets daily with breakfast . Interventions: o Comprehensive medication review o Reviewed home BG readings - patient checks daily and readings are between 80-100. Denies hypoglycemia  or symptoms of hypoglycemia. . Patient self care activities - Over the next 6 months, patient will: o Check blood sugar once daily, document, and provide at future appointments o Contact provider with any episodes of hypoglycemia  Please see past updates related to this goal by clicking on the "Past Updates" button in the selected goal        The patient verbalized understanding of instructions, educational materials, and care plan provided today and declined offer to receive copy of patient instructions, educational materials, and care plan.   Telephone follow up appointment with pharmacy team member scheduled for: September 18, 2020 at 8:30 AM (PHONE VISIT)  Phil Dopp, PharmD Clinical Pharmacist [facility] at [facility] [phone]   Basics of Medicine Management Taking your medicines correctly is an important part of managing or preventing medical problems. Make sure you know what disease or condition your medicine is treating, and how and when to take it. If you do not take your medicine correctly, it may not work well and may cause unpleasant side effects, including serious health problems. What should I do when I am taking medicines?  Read all the labels and inserts that come with your medicines. Review the information often.  Talk with your pharmacist if you get a refill and notice a change in the size, color, or shape of your medicines.  Know the potential side effects for each medicine that you take.  Try to get all your medicines from the same pharmacy. The pharmacist will have  all your information and will understand how your medicines will affect each other (interact).  Tell your health care provider about all your medicines, including over-the-counter medicines, vitamins, and herbal or dietary supplements. He or she will make sure that nothing will interact with any of your prescribed medicines.   How can I take my medicines safely?  Take medicines only  as told by your health care provider. ? Do not take more of your medicine than instructed. ? Do not take anyone else's medicines. ? Do not share your medicines with others. ? Do not stop taking your medicines unless your health care provider tells you to do so. ? You may need to avoid alcohol or certain foods or liquids when taking certain medicines. Follow your health care provider's instructions.  Do not split, mash, or chew your medicines unless your health care provider tells you to do so. Tell your health care provider if you have trouble swallowing your medicines.  For liquid medicine, use the dosing container that was provided. How should I organize my medicines? Know your medicines  Know what each of your medicines looks like. This includes size, color, and shape. Tell your health care provider if you are having trouble recognizing all the medicines that you are taking.  If you cannot tell your medicines apart because they look similar, keep them in original bottles.  If you cannot read the labels on the bottles, tell your pharmacist to put your medicines in containers with large print.  Review your medicines and your schedule with family members, a friend, or a caregiver. Use a pill organizer  Use a tool to organize your medicine schedule. Tools include a weekly pillbox, a written chart, a notebook, or a calendar.  Your tool should help you remember the following things about each medicine: ? The name of the medicine. ? The amount (dose) to take. ? The schedule. This is the day and time the medicine should be taken. ? The appearance. This includes color, shape, size, and stamp. ? How to take your medicines. This includes instructions to take them with food, without food, with fluids, or with other medicines.  Create reminders for taking your medicines. Use sticky notes, or alarms on your watch, mobile device, or phone calendar.  You may choose to use a more advanced  management system. These systems have storage, alarms, and visual and audio prompts.  Some medicines can be taken on an "as-needed" basis. These include medicines for nausea or pain. If you take an as-needed medicine, write down the name and dose, as well as the date and time that you took it.   How should I plan for travel?  Take your pillbox, medicines, and organization system with you when traveling.  Have your medicines refilled before you travel. This will ensure that you do not run out of your medicines while you are away from home.  Always carry an updated list of your medicines with you. If there is an emergency, a first responder can quickly see what medicines you are taking.  Do not pack your medicines in checked luggage in case your luggage is lost or delayed.  If any of your medicines is considered a controlled substance, make sure you bring a letter from your health care provider with you. How should I store and discard my medicines? For safe storage:  Store medicines in a cool, dry area away from light, or as directed by your health care provider. Do not store medicines in  the bathroom. Heat and humidity will affect them.  Do not store your medicines with other chemicals, or with medicines for pets or other household members.  Keep medicines away from children and pets. Do not leave them on counters or bedside tables. Store them in high cabinets or on high shelves. For safe disposal:  Check expiration dates regularly. Do not take expired medicines. Discard medicines that are older than the expiration date.  Learn a safe way to dispose of your medicines. You may: ? Use a local government, hospital, or pharmacy medicine-take-back program. ? Mix the medicines with inedible substances, put them in a sealed bag or empty container, and throw them in the trash. What should I remember?  Tell your health care provider if you: ? Experience side effects. ? Have new  symptoms. ? Have other concerns about taking your medicines.  Review your medicines regularly with your health care provider. Other medicines, diet, medical conditions, weight changes, and daily habits can all affect how medicines work. Ask if you need to continue taking each medicine, and discuss how well each one is working.  Refill your medicines early to avoid running out of them.  In case of an accidental overdose, call your local [place] at [phone] or visit your local emergency department immediately. This is important. Summary  Taking your medicines correctly is an important part of managing or preventing medical problems.  You need to make sure that you understand what you are taking a medicine for, as well as how and when you need to take it.  Know your medicines and use a pill organizer to help you take your medicines correctly.  In case of an accidental overdose, call your local [place] at [phone] or visit your local emergency department immediately. This is important. This information is not intended to replace advice given to you by your health care provider. Make sure you discuss any questions you have with your health care provider. Document Revised: 01/28/2017 Document Reviewed: 01/28/2017 Elsevier Patient Education  2021 [employer].

## 2020-03-21 NOTE — Progress Notes (Signed)
I have personally reviewed this encounter including the documentation in this note and have collaborated with the care management provider regarding care management and care coordination activities to include development and update of the comprehensive care plan. I am certifying that I agree with the content of this note and encounter as supervising physician. ? ?Abednego Yeates MD  ?

## 2020-06-14 ENCOUNTER — Other Ambulatory Visit: Payer: Self-pay | Admitting: Family Medicine

## 2020-06-26 ENCOUNTER — Other Ambulatory Visit: Payer: Self-pay

## 2020-06-26 ENCOUNTER — Other Ambulatory Visit (INDEPENDENT_AMBULATORY_CARE_PROVIDER_SITE_OTHER): Payer: Medicare Other

## 2020-06-26 ENCOUNTER — Telehealth: Payer: Self-pay | Admitting: Radiology

## 2020-06-26 ENCOUNTER — Telehealth (INDEPENDENT_AMBULATORY_CARE_PROVIDER_SITE_OTHER): Payer: Medicare Other | Admitting: Family Medicine

## 2020-06-26 DIAGNOSIS — E1169 Type 2 diabetes mellitus with other specified complication: Secondary | ICD-10-CM

## 2020-06-26 DIAGNOSIS — E119 Type 2 diabetes mellitus without complications: Secondary | ICD-10-CM

## 2020-06-26 DIAGNOSIS — E785 Hyperlipidemia, unspecified: Secondary | ICD-10-CM | POA: Diagnosis not present

## 2020-06-26 DIAGNOSIS — R7989 Other specified abnormal findings of blood chemistry: Secondary | ICD-10-CM

## 2020-06-26 DIAGNOSIS — I10 Essential (primary) hypertension: Secondary | ICD-10-CM

## 2020-06-26 LAB — COMPREHENSIVE METABOLIC PANEL
ALT: 14 U/L (ref 0–35)
AST: 23 U/L (ref 0–37)
Albumin: 4.5 g/dL (ref 3.5–5.2)
Alkaline Phosphatase: 65 U/L (ref 39–117)
BUN: 11 mg/dL (ref 6–23)
CO2: 31 mEq/L (ref 19–32)
Calcium: 10.6 mg/dL — ABNORMAL HIGH (ref 8.4–10.5)
Chloride: 94 mEq/L — ABNORMAL LOW (ref 96–112)
Creatinine, Ser: 0.87 mg/dL (ref 0.40–1.20)
GFR: 63.92 mL/min (ref >60.00)
Glucose, Bld: 98 mg/dL (ref 70–99)
Potassium: 3.9 mEq/L (ref 3.5–5.1)
Sodium: 133 mEq/L — ABNORMAL LOW (ref 135–145)
Total Bilirubin: 0.6 mg/dL (ref 0.2–1.2)
Total Protein: 7.2 g/dL (ref 6.0–8.3)

## 2020-06-26 LAB — CBC WITH DIFFERENTIAL/PLATELET
Basophils Absolute: 0.1 10*3/uL (ref 0.0–0.1)
Basophils Relative: 0.8 % (ref 0.0–3.0)
Eosinophils Absolute: 0.2 10*3/uL (ref 0.0–0.7)
Eosinophils Relative: 2.2 % (ref 0.0–5.0)
HCT: 35.7 % — ABNORMAL LOW (ref 36.0–46.0)
Hemoglobin: 12.2 g/dL (ref 12.0–15.0)
Lymphocytes Relative: 30.9 % (ref 12.0–46.0)
Lymphs Abs: 2.6 10*3/uL (ref 0.7–4.0)
MCHC: 34.3 g/dL (ref 30.0–36.0)
MCV: 81.8 fl (ref 78.0–100.0)
Monocytes Absolute: 0.7 10*3/uL (ref 0.1–1.0)
Monocytes Relative: 8.6 % (ref 3.0–12.0)
Neutro Abs: 4.9 10*3/uL (ref 1.4–7.7)
Neutrophils Relative %: 57.5 % (ref 43.0–77.0)
Platelets: 319 10*3/uL (ref 150.0–400.0)
RBC: 4.36 Mil/uL (ref 3.87–5.11)
RDW: 14.2 % (ref 11.5–15.5)
WBC: 8.4 10*3/uL (ref 4.0–10.5)

## 2020-06-26 LAB — HEMOGLOBIN A1C: Hgb A1c MFr Bld: 6 % (ref 4.6–6.5)

## 2020-06-26 LAB — LIPID PANEL
Cholesterol: 171 mg/dL (ref 0–200)
HDL: 92.3 mg/dL (ref >39.00)
LDL Cholesterol: 67 mg/dL (ref 0–99)
NonHDL: 78.22
Total CHOL/HDL Ratio: 2
Triglycerides: 58 mg/dL (ref 0.0–149.0)
VLDL: 11.6 mg/dL (ref 0.0–40.0)

## 2020-06-26 LAB — TSH: TSH: 5.89 u[IU]/mL — ABNORMAL HIGH (ref 0.35–4.50)

## 2020-06-26 NOTE — Telephone Encounter (Signed)
error 

## 2020-06-26 NOTE — Telephone Encounter (Signed)
-----   Message from Ellamae Sia sent at 06/26/2020  7:42 AM EDT ----- Regarding: lab orders for now  AWV lab orders, please.

## 2020-06-27 ENCOUNTER — Telehealth: Payer: Self-pay

## 2020-06-27 NOTE — Chronic Care Management (AMB) (Addendum)
Chronic Care Management Pharmacy Assistant   Name: [name]  MRN: [id number] DOB: November 26, 1940  Reason for Encounter: Disease State- Diabetes and Hypertension  Recent office visits:  None since last CCM Visit  Recent consult visits:  None since last CCM visit  Hospital visits:  None in previous 6 months  Medications: Outpatient Encounter Medications as of 06/27/2020  Medication Sig   ACCU-CHEK AVIVA PLUS test strip USE TO CHECK BLOOD SUGAR ONCE DAILY (DX. E11.9)   ACCU-CHEK FASTCLIX LANCETS MISC Check glucose once daily (Dx. E11.9)   Accu-Chek Softclix Lancets lancets To check glucose daily and as needed for diabetes type 2   amLODipine (NORVASC) 5 MG tablet TAKE 1 TABLET BY MOUTH EVERY DAY   atorvastatin (LIPITOR) 10 MG tablet TAKE 1 TABLET (10 MG TOTAL) BY MOUTH DAILY. IN EVENING   Blood Glucose Monitoring Suppl (ACCU-CHEK AVIVA PLUS) w/Device KIT Use to check glucose once daily for DM 2 (Dx. E11.9)   cholecalciferol (VITAMIN D) 1000 UNITS tablet Take 1,000 Units by mouth daily.   Emollient (EUCERIN) lotion Apply 1 Bottle topically daily as needed for dry skin.   hydrochlorothiazide (HYDRODIURIL) 25 MG tablet TAKE 1 TABLET BY MOUTH EVERY DAY IN THE MORNING   KLOR-CON M20 20 MEQ tablet TAKE 1 TABLET BY MOUTH EVERY DAY   lisinopril (ZESTRIL) 40 MG tablet TAKE 1 TABLET BY MOUTH EVERY DAY   metFORMIN (GLUCOPHAGE-XR) 500 MG 24 hr tablet TAKE 2 TABLETS BY MOUTH EVERY DAY WITH BREAKFAST   Multiple Vitamin (MULTIVITAMIN) tablet Take 1 tablet by mouth daily.   Polyethyl Glycol-Propyl Glycol (SYSTANE FREE OP) Place 2 drops into both eyes daily.   No facility-administered encounter medications on file as of 06/27/2020.     Recent Relevant Labs: Lab Results  Component Value Date/Time   HGBA1C 6.0 06/26/2020 08:45 AM   HGBA1C 6.2 (H) 09/15/2019 02:57 PM   MICROALBUR 0.6 08/07/2008 09:54 AM    Kidney Function Lab Results  Component Value Date/Time   CREATININE 0.87  06/26/2020 08:45 AM   CREATININE 1.00 09/15/2019 02:56 PM   GFR 63.92 06/26/2020 08:45 AM   GFRNONAA 54 (L) 09/15/2019 02:56 PM   GFRAA >60 09/15/2019 02:56 PM     Current antihyperglycemic regimen:  Metformin 500 mg ER - 2 tablets daily with breakfast   Patient verbally confirms she is taking the above medications as directed. Yes  What recent interventions/DTPs have been made to improve glycemic control:  No recent interventions for DM  Have there been any recent hospitalizations or ED visits since last visit with CPP? No  Patient denies hypoglycemic symptoms, including Pale, Sweaty, Shaky, Hungry, Nervous/irritable and Vision changes  Patient denies hyperglycemic symptoms, including blurry vision, excessive thirst, fatigue, polyuria and weakness  How often are you checking your blood sugar? once daily  What are your blood sugars ranging?  Fasting: 89-107 Before meals: N/A After meals: N/A Bedtime: N/A  On insulin? No   During the week, how often does your blood glucose drop below 70? Never  Are you checking your feet daily/regularly? Yes- denies any wounds or blisters. Does states she has a dark spot on her toe where she dropped something on it months ago. Denies any pain.   Adherence Review: Is the patient currently on a STATIN medication? Yes Is the patient currently on ACE/ARB medication? Yes Does the patient have >5 day gap between last estimated fill dates? No   Recent Office Vitals: BP Readings from Last 3 Encounters:  02/23/20  136/72  11/03/19 128/74  09/22/19 (!) 123/54   Pulse Readings from Last 3 Encounters:  02/23/20 83  11/03/19 82  09/22/19 66    Wt Readings from Last 3 Encounters:  11/03/19 124 lb (56.2 kg)  09/21/19 132 lb (59.9 kg)  09/15/19 132 lb 4 oz (60 kg)     Kidney Function Lab Results  Component Value Date/Time   CREATININE 0.87 06/26/2020 08:45 AM   CREATININE 1.00 09/15/2019 02:56 PM   GFR 63.92 06/26/2020 08:45 AM    GFRNONAA 54 (L) 09/15/2019 02:56 PM   GFRAA >60 09/15/2019 02:56 PM    BMP Latest Ref Rng & Units 06/26/2020 12/04/2019 09/15/2019  Glucose 70 - 99 mg/dL 98 - 109(H)  BUN 6 - 23 mg/dL 11 - 11  Creatinine 0.40 - 1.20 mg/dL 0.87 - 1.00  Sodium 135 - 145 mEq/L 133(L) - 137  Potassium 3.5 - 5.1 mEq/L 3.9 3.5 3.2(L)  Chloride 96 - 112 mEq/L 94(L) - 97(L)  CO2 19 - 32 mEq/L 31 - 28  Calcium 8.4 - 10.5 mg/dL 10.6(H) - 10.5(H)     Current antihypertensive regimen:  Hydrochlorothiazide 25 mg - 1 tablet daily Klor Con 20 mEq - 1 tablet daily Lisinopril 40 mg - 1 tablet daily Amlodipine 5 mg - 1 tablet daily   Patient verbally confirms she is taking the above medications as directed. Yes  How often are you checking your Blood Pressure? several times per month  she checks her blood pressure in the morning before taking her medication.  Current home BP readings:   DATE:             BP               PULSE  -  128/72  -    Wrist or arm cuff: Arm Caffeine intake: Tea, Hot Chocolate at times Salt intake: Does not add salt to food.  OTC medications including pseudoephedrine or NSAIDs? No  Any readings above 180/120? No If yes any symptoms of hypertensive emergency? patient denies any symptoms of high blood pressure   What recent interventions/DTPs have been made by any provider to improve Blood Pressure control since last CPP Visit:  No recent interventions  Any recent hospitalizations or ED visits since last visit with CPP? No  What diet changes have been made to improve Blood Pressure Control?  No changes. Tries to avoid fried food.  What exercise is being done to improve your Blood Pressure Control?  Active around the house.   Adherence Review: Is the patient currently on ACE/ARB medication? Yes Does the patient have >5 day gap between last estimated fill dates? CPP to review   Star Rating Drugs:  Medication:  Last Fill: Day Supply Atorvastatin 10 mg 06/14/20 90 Lisinopril  40 mg 06/14/20 90 Metformin 500 mg XR 05/23/20  30  PCP appointment for CPX  on 07/10/20  Follow-Up:  Pharmacist Review  Debbora Dus, CPP notified  [name], [place] Assistant [phone]  I have reviewed the care management and care coordination activities outlined in this encounter and I am certifying that I agree with the content of this note. No further action required.  Debbora Dus, PharmD Clinical Pharmacist [facility] [phone]

## 2020-07-01 ENCOUNTER — Telehealth: Payer: Self-pay | Admitting: *Deleted

## 2020-07-01 NOTE — Telephone Encounter (Signed)
Patient called and left a voicemail stating that she had a tick on her arm yesterday that was embedded. Patient stated that her daughter took a picture of it and then removed it.Patient stated that she put the tick in a bag. Patent stated that her arm is hurting today, swollen and is red around the spot.  After speaking with Dr. Glori Bickers I called the patient back. Patient denies a fever, chills or body aches. Patient was advised to use soap and water to clean the area then apply OTC cortisone cream. Patient was scheduled an appointment with Dr. Diona Browner tomorrow at 2:20 pm. Patient stated if she gets up in the morning and her arm is improved she will call and cancel the appointment with Dr. Diona Browner

## 2020-07-01 NOTE — Telephone Encounter (Signed)
Aware ?Will watch for correspondence  ?

## 2020-07-01 NOTE — Telephone Encounter (Signed)
Noted  

## 2020-07-02 ENCOUNTER — Ambulatory Visit (INDEPENDENT_AMBULATORY_CARE_PROVIDER_SITE_OTHER): Payer: Medicare Other | Admitting: Family Medicine

## 2020-07-02 ENCOUNTER — Other Ambulatory Visit: Payer: Self-pay

## 2020-07-02 ENCOUNTER — Encounter: Payer: Self-pay | Admitting: Family Medicine

## 2020-07-02 VITALS — BP 108/62 | HR 80 | Temp 97.4°F | Ht 60.0 in | Wt 111.0 lb

## 2020-07-02 DIAGNOSIS — S40861A Insect bite (nonvenomous) of right upper arm, initial encounter: Secondary | ICD-10-CM | POA: Insufficient documentation

## 2020-07-02 DIAGNOSIS — W57XXXA Bitten or stung by nonvenomous insect and other nonvenomous arthropods, initial encounter: Secondary | ICD-10-CM

## 2020-07-02 NOTE — Patient Instructions (Signed)
Tick Bite Information, Adult Ticks are insects that draw blood for food. Most ticks live in shrubs and grassy and wooded areas. They climb onto people and animals that brush against the leaves and grasses that they rest on. Then they bite, attaching themselves to the skin. Most ticks are harmless, but some ticks may carry germs that can spread to a person through a bite and cause a disease. To reduce your risk of getting a disease from a tick bite, make sure you:  Take steps to prevent tick bites.  Check for ticks after being outdoors where ticks live.  Watch for symptoms of disease if a tick attached to you or if you suspect a tick bite. How can I prevent tick bites? Take these steps to help prevent tick bites when you go outdoors in an area where ticks live: Use insect repellent  Use insect repellent that has DEET (20% or higher), picaridin, or IR3535 in it. Follow the instructions on the label. Use these products on: ? Bare skin. ? The top of your boots. ? Your pant legs. ? Your sleeve cuffs.  For insect repellent that contains permethrin, follow the instructions on the label. Use these products on: ? Clothing. ? Boots. ? Outdoor gear. ? Tents. When you are outside  Wear protective clothing. Long sleeves and long pants offer the best protection from ticks.  Wear light-colored clothing so you can see ticks more easily.  Tuck your pant legs into your socks.  If you go walking on a trail, stay in the middle of the trail so your skin, hair, and clothing do not touch the bushes.  Avoid walking through areas with long grass.  Check for ticks on your clothing, hair, and skin often while you are outside, and check again before you go inside. Make sure to check the scalp, neck, armpits, waist, groin, and joint areas. These are the spots where ticks attach themselves most often. When you go indoors  Check your clothing for ticks. Tumble dry clothes in a dryer on high heat for at least  10 minutes. If clothes are damp, additional time may be needed. If clothes require washing, use hot water.  Examine gear and pets.  Shower soon after being outdoors.  Check your body for ticks. Conduct a full body check using a mirror. What is the proper way to remove a tick? If you find a tick on your body, remove it as soon as possible. Removing a tick sooner can prevent germs from passing to your body. Do not remove the tick with your bare fingers. To remove a tick that is crawling on your skin but has not bitten, use either of these methods:  Go outdoors and brush the tick off.  Remove the tick with tape or a lint roller. To remove a tick that is attached to your skin: 1. Wash your hands. If you have latex gloves, put them on. 2. Use fine-tipped tweezers, curved forceps, or a tick-removal tool to gently grasp the tick as close to your skin and the tick's head as possible. 3. Gently pull with a steady, upward, even pressure until the tick lets go. 4. When removing the tick: ? Take care to keep the tick's head attached to its body. ? Do not twist or jerk the tick. This can make the tick's head or mouth parts break off and remain in the skin. ? Do not squeeze or crush the tick's body. This could force disease-carrying fluids from the tick   into your body. Do not try to remove a tick with heat, alcohol, petroleum jelly, or fingernail polish. Using these methods can cause the tick to salivate and regurgitate into your bloodstream, increasing your risk of getting a disease.   What should I do after removing a tick?  Dispose of the tick. Do not crush a tick with your fingers.  Clean the bite area and your hands with soap and water, rubbing alcohol, or an iodine scrub.  If an antiseptic cream or ointment is available, apply a small amount to the bite site.  Wash and disinfect any instruments that you used to remove the tick. How should I dispose of a tick? To dispose of a live tick, use  one of these methods:  Place it in rubbing alcohol.  Place it in a sealed bag or container.  Wrap it tightly in tape.  Flush it down the toilet. Contact a health care provider if:  You have symptoms of a disease in next 2 weeks after a tick bite. Symptoms of a tick-borne disease can occur from moments after the tick bites to 30 days after a tick is removed. Symptoms include: ? Fever or chills. ? Any of these signs in the bite area:  A red rash that makes a circle (bull's-eye rash) in the bite area.  Redness and swelling. ? Headache. ? Muscle, joint, or bone pain. ? Abnormal tiredness. ? Numbness in your legs or difficulty walking or moving your legs. ? Tender, swollen lymph glands.  A part of a tick breaks off and gets stuck in your skin. Get help right away if:  You are not able to remove a tick.  You experience muscle weakness or paralysis.  Your symptoms get worse or you experience new symptoms.  You find an engorged tick on your skin and you are in an area where disease from ticks is a high risk. Summary  Ticks may carry germs that can spread to a person through a bite and cause a disease.  Wear protective clothing and use insect repellent to prevent tick bites. Follow the instructions on the label.  If you find a tick on your body, remove it as soon as possible. If the tick is attached, do not try to remove with heat, alcohol, petroleum jelly, or fingernail polish.  Remove the attached tick using fine-tipped tweezers, curved forceps, or a tick-removal tool. Gently pull with steady, upward, even pressure until the tick lets go. Do not twist or jerk the tick. Do not squeeze or crush the tick's body.  If you have symptoms of a disease after being bitten by a tick, contact a health care provider. This information is not intended to replace advice given to you by your health care provider. Make sure you discuss any questions you have with your health care  provider. Document Revised: 01/30/2019 Document Reviewed: 01/30/2019 Elsevier Patient Education  2021 [employer].

## 2020-07-02 NOTE — Assessment & Plan Note (Signed)
Acute, improving  Continue topical steroid cream.  Reviewed return precautions and time course of possible  Tick borne illness.  Tick borne illness less like given attached < 24 hours likely, not engorged.

## 2020-07-02 NOTE — Progress Notes (Signed)
Patient ID: [name], female    DOB: 1940-02-28, 80 y.o.   MRN: [id number]  This visit was conducted in person.  BP 108/62   Pulse 80   Temp (!) 97.4 F (36.3 C) (Temporal)   Ht 5' (1.524 m)   Wt 111 lb (50.3 kg)   SpO2 96%   BMI 21.68 kg/m    CC:  Chief Complaint  Patient presents with  . Tick Removal    Tick removed by daughter. Patient's arm is red and raised.     Subjective:   HPI: [name] is a 80 y.o. female presenting on 07/02/2020 for Tick Removal (Tick removed by daughter. Patient's arm is red and raised. )  She noted tick on underarm 06/30/20.Marland Kitchen not sure how long attached. Felt itchy... very tiny. She removed the tick felt like she got it all.  Since then she has noted redness around the area and mild swelling in the area.   No fever.  She has been applying cortisone cream twice a day since yesterday.. better swelling and less itching.      Relevant past medical, surgical, family and social history reviewed and updated as indicated. Interim medical history since our last visit reviewed. Allergies and medications reviewed and updated. Outpatient Medications Prior to Visit  Medication Sig Dispense Refill  . ACCU-CHEK AVIVA PLUS test strip USE TO CHECK BLOOD SUGAR ONCE DAILY (DX. E11.9) 50 strip 1  . ACCU-CHEK FASTCLIX LANCETS MISC Check glucose once daily (Dx. E11.9) 102 each 1  . Accu-Chek Softclix Lancets lancets To check glucose daily and as needed for diabetes type 2 100 each 5  . amLODipine (NORVASC) 5 MG tablet TAKE 1 TABLET BY MOUTH EVERY DAY 90 tablet 0  . atorvastatin (LIPITOR) 10 MG tablet TAKE 1 TABLET (10 MG TOTAL) BY MOUTH DAILY. IN EVENING 90 tablet 0  . Blood Glucose Monitoring Suppl (ACCU-CHEK AVIVA PLUS) w/Device KIT Use to check glucose once daily for DM 2 (Dx. E11.9) 1 kit 0  . cholecalciferol (VITAMIN D) 1000 UNITS tablet Take 1,000 Units by mouth daily.    . Emollient (EUCERIN) lotion Apply 1 Bottle topically daily as needed  for dry skin.    . hydrochlorothiazide (HYDRODIURIL) 25 MG tablet TAKE 1 TABLET BY MOUTH EVERY DAY IN THE MORNING 90 tablet 0  . KLOR-CON M20 20 MEQ tablet TAKE 1 TABLET BY MOUTH EVERY DAY 90 tablet 0  . lisinopril (ZESTRIL) 40 MG tablet TAKE 1 TABLET BY MOUTH EVERY DAY 90 tablet 0  . metFORMIN (GLUCOPHAGE-XR) 500 MG 24 hr tablet TAKE 2 TABLETS BY MOUTH EVERY DAY WITH BREAKFAST 60 tablet 5  . Multiple Vitamin (MULTIVITAMIN) tablet Take 1 tablet by mouth daily.    Bertram Gala Glycol-Propyl Glycol (SYSTANE FREE OP) Place 2 drops into both eyes daily.     No facility-administered medications prior to visit.     Per HPI unless specifically indicated in ROS section below Review of Systems  Constitutional: Negative for fatigue and fever.  HENT: Negative for congestion.   Eyes: Negative for pain.  Respiratory: Negative for cough and shortness of breath.   Cardiovascular: Negative for chest pain, palpitations and leg swelling.  Gastrointestinal: Negative for abdominal pain.  Genitourinary: Negative for dysuria and vaginal bleeding.  Musculoskeletal: Negative for back pain.  Neurological: Negative for syncope, light-headedness and headaches.  Psychiatric/Behavioral: Negative for dysphoric mood.   Objective:  BP 108/62   Pulse 80   Temp (!) 97.4 F (36.3 C) (Temporal)  Ht 5' (1.524 m)   Wt 111 lb (50.3 kg)   SpO2 96%   BMI 21.68 kg/m   Wt Readings from Last 3 Encounters:  07/02/20 111 lb (50.3 kg)  11/03/19 124 lb (56.2 kg)  09/21/19 132 lb (59.9 kg)      Physical Exam Constitutional:      General: She is not in acute distress.    Appearance: Normal appearance. She is well-developed. She is not ill-appearing or toxic-appearing.  HENT:     Head: Normocephalic.     Right Ear: Hearing, tympanic membrane, ear canal and external ear normal. Tympanic membrane is not erythematous, retracted or bulging.     Left Ear: Hearing, tympanic membrane, ear canal and external ear normal.  Tympanic membrane is not erythematous, retracted or bulging.     Nose: No mucosal edema or rhinorrhea.     Right Sinus: No maxillary sinus tenderness or frontal sinus tenderness.     Left Sinus: No maxillary sinus tenderness or frontal sinus tenderness.     Mouth/Throat:     Pharynx: Uvula midline.  Eyes:     General: Lids are normal. Lids are everted, no foreign bodies appreciated.     Conjunctiva/sclera: Conjunctivae normal.     Pupils: Pupils are equal, round, and reactive to light.  Neck:     Thyroid: No thyroid mass or thyromegaly.     Vascular: No carotid bruit.     Trachea: Trachea normal.  Cardiovascular:     Rate and Rhythm: Normal rate and regular rhythm.     Pulses: Normal pulses.     Heart sounds: Normal heart sounds, S1 normal and S2 normal. No murmur heard. No friction rub. No gallop.   Pulmonary:     Effort: Pulmonary effort is normal. No tachypnea or respiratory distress.     Breath sounds: Normal breath sounds. No decreased breath sounds, wheezing, rhonchi or rales.  Abdominal:     General: Bowel sounds are normal.     Palpations: Abdomen is soft.     Tenderness: There is no abdominal tenderness.  Musculoskeletal:     Cervical back: Normal range of motion and neck supple.  Skin:    General: Skin is warm and dry.     Findings: No rash.     Comments: Small  Excoriation ( no mouthparts of tick remaining) with 1.5 cm circumference of surrounding swelling, slight erythema, no increased warmth.  no fluctuance, pustule, blister or abscess.  Neurological:     Mental Status: She is alert.  Psychiatric:        Mood and Affect: Mood is not anxious or depressed.        Speech: Speech normal.        Behavior: Behavior normal. Behavior is cooperative.        Thought Content: Thought content normal.        Judgment: Judgment normal.       Results for orders placed or performed in visit on 06/26/20  CBC with Differential/Platelet  Result Value Ref Range   WBC 8.4 4.0 -  10.5 K/uL   RBC 4.36 3.87 - 5.11 Mil/uL   Hemoglobin 12.2 12.0 - 15.0 g/dL   HCT 35.7 (L) 36.0 - 46.0 %   MCV 81.8 78.0 - 100.0 fl   MCHC 34.3 30.0 - 36.0 g/dL   RDW 14.2 11.5 - 15.5 %   Platelets 319.0 150.0 - 400.0 K/uL   Neutrophils Relative % 57.5 43.0 - 77.0 %   Lymphocytes  Relative 30.9 12.0 - 46.0 %   Monocytes Relative 8.6 3.0 - 12.0 %   Eosinophils Relative 2.2 0.0 - 5.0 %   Basophils Relative 0.8 0.0 - 3.0 %   Neutro Abs 4.9 1.4 - 7.7 K/uL   Lymphs Abs 2.6 0.7 - 4.0 K/uL   Monocytes Absolute 0.7 0.1 - 1.0 K/uL   Eosinophils Absolute 0.2 0.0 - 0.7 K/uL   Basophils Absolute 0.1 0.0 - 0.1 K/uL  Comprehensive metabolic panel  Result Value Ref Range   Sodium 133 (L) 135 - 145 mEq/L   Potassium 3.9 3.5 - 5.1 mEq/L   Chloride 94 (L) 96 - 112 mEq/L   CO2 31 19 - 32 mEq/L   Glucose, Bld 98 70 - 99 mg/dL   BUN 11 6 - 23 mg/dL   Creatinine, Ser 0.87 0.40 - 1.20 mg/dL   Total Bilirubin 0.6 0.2 - 1.2 mg/dL   Alkaline Phosphatase 65 39 - 117 U/L   AST 23 0 - 37 U/L   ALT 14 0 - 35 U/L   Total Protein 7.2 6.0 - 8.3 g/dL   Albumin 4.5 3.5 - 5.2 g/dL   GFR 63.92 >60.00 mL/min   Calcium 10.6 (H) 8.4 - 10.5 mg/dL  Hemoglobin A1c  Result Value Ref Range   Hgb A1c MFr Bld 6.0 4.6 - 6.5 %  Lipid panel  Result Value Ref Range   Cholesterol 171 0 - 200 mg/dL   Triglycerides 58.0 0.0 - 149.0 mg/dL   HDL 92.30 >39.00 mg/dL   VLDL 11.6 0.0 - 40.0 mg/dL   LDL Cholesterol 67 0 - 99 mg/dL   Total CHOL/HDL Ratio 2    NonHDL 78.22   TSH  Result Value Ref Range   TSH 5.89 (H) 0.35 - 4.50 uIU/mL    This visit occurred during the SARS-CoV-2 public health emergency.  Safety protocols were in place, including screening questions prior to the visit, additional usage of staff PPE, and extensive cleaning of exam room while observing appropriate contact time as indicated for disinfecting solutions.   COVID 19 screen:  No recent travel or known exposure to COVID19 The patient denies  respiratory symptoms of COVID 19 at this time. The importance of social distancing was discussed today.   Assessment and Plan Problem List Items Addressed This Visit    Tick bite of right upper arm - Primary    Acute, improving  Continue topical steroid cream.  Reviewed return precautions and time course of possible  Tick borne illness.  Tick borne illness less like given attached < 24 hours likely, not engorged.            Eliezer Lofts, MD

## 2020-07-10 ENCOUNTER — Ambulatory Visit (INDEPENDENT_AMBULATORY_CARE_PROVIDER_SITE_OTHER): Payer: Medicare Other | Admitting: Family Medicine

## 2020-07-10 ENCOUNTER — Encounter: Payer: Self-pay | Admitting: Family Medicine

## 2020-07-10 ENCOUNTER — Other Ambulatory Visit: Payer: Self-pay

## 2020-07-10 VITALS — BP 110/64 | HR 69 | Temp 96.9°F | Ht 60.0 in | Wt 109.3 lb

## 2020-07-10 DIAGNOSIS — E1169 Type 2 diabetes mellitus with other specified complication: Secondary | ICD-10-CM | POA: Diagnosis not present

## 2020-07-10 DIAGNOSIS — S40861D Insect bite (nonvenomous) of right upper arm, subsequent encounter: Secondary | ICD-10-CM | POA: Diagnosis not present

## 2020-07-10 DIAGNOSIS — E119 Type 2 diabetes mellitus without complications: Secondary | ICD-10-CM | POA: Diagnosis not present

## 2020-07-10 DIAGNOSIS — W57XXXD Bitten or stung by nonvenomous insect and other nonvenomous arthropods, subsequent encounter: Secondary | ICD-10-CM | POA: Diagnosis not present

## 2020-07-10 DIAGNOSIS — R634 Abnormal weight loss: Secondary | ICD-10-CM | POA: Diagnosis not present

## 2020-07-10 DIAGNOSIS — Z0000 Encounter for general adult medical examination without abnormal findings: Secondary | ICD-10-CM

## 2020-07-10 DIAGNOSIS — E785 Hyperlipidemia, unspecified: Secondary | ICD-10-CM | POA: Diagnosis not present

## 2020-07-10 DIAGNOSIS — I10 Essential (primary) hypertension: Secondary | ICD-10-CM

## 2020-07-10 DIAGNOSIS — R7989 Other specified abnormal findings of blood chemistry: Secondary | ICD-10-CM

## 2020-07-10 MED ORDER — LISINOPRIL 40 MG PO TABS: 40.0000 mg | ORAL_TABLET | Freq: Every day | ORAL | 3 refills | Status: DC

## 2020-07-10 MED ORDER — AMLODIPINE BESYLATE 5 MG PO TABS: 1.0000 | ORAL_TABLET | Freq: Every day | ORAL | 3 refills | Status: DC

## 2020-07-10 MED ORDER — METFORMIN HCL ER 500 MG PO TB24: ORAL_TABLET | ORAL | 3 refills | Status: DC

## 2020-07-10 MED ORDER — POTASSIUM CHLORIDE CRYS ER 20 MEQ PO TBCR: 20.0000 meq | EXTENDED_RELEASE_TABLET | Freq: Every day | ORAL | 3 refills | Status: DC

## 2020-07-10 MED ORDER — HYDROCHLOROTHIAZIDE 25 MG PO TABS: ORAL_TABLET | ORAL | 3 refills | Status: DC

## 2020-07-10 MED ORDER — ATORVASTATIN CALCIUM 10 MG PO TABS: 10.0000 mg | ORAL_TABLET | Freq: Every day | ORAL | 3 refills | Status: DC

## 2020-07-10 NOTE — Assessment & Plan Note (Signed)
Reviewed health habits including diet and exercise and skin cancer prevention Reviewed appropriate screening tests for age  Also reviewed health mt list, fam hx and immunization status , as well as social and family history   See HPI Labs reviewed  cologuard utd 6/21 Mammogram planned 08/04/20  Declines shingrix due to past reaction  covid vaccinated with booster  Nl dexa 3/17   No falls or fractures and takes vit D Advance directive in place, materials given to use if she wants to update it  No cognitive concerns Nl hearing screen  utd eye and vision care

## 2020-07-10 NOTE — Assessment & Plan Note (Signed)
Lab Results  Component Value Date   HGBA1C 6.0 06/26/2020   Very well controlled  Plan to continue metformin xr 1000 mg daily with good diet  Encouraged good protein intake  On ace and statin  Eye exam utd Good foot care

## 2020-07-10 NOTE — Assessment & Plan Note (Signed)
Disc goals for lipids and reasons to control them Rev last labs with pt Rev low sat fat diet in detail  LDL of 67 -improved with very high HDL  Plan to continue atorvastatin 10 mg daily

## 2020-07-10 NOTE — Assessment & Plan Note (Signed)
bp in fair control at this time  BP Readings from Last 1 Encounters:  07/10/20 110/64   No changes needed Most recent labs reviewed  Disc lifstyle change with low sodium diet and exercise  Plan to continue amlodipine 5 mg daily  hctz 25 mg daily  Lisinopril 40 mg daily

## 2020-07-10 NOTE — Assessment & Plan Note (Signed)
Lab Results  Component Value Date   TSH 5.89 (H) 06/26/2020    Overall not much clinical change  Will re check in 6 mo with thyroid profile and treat if needed

## 2020-07-10 NOTE — Patient Instructions (Addendum)
If you want to update the advance directive in the future-here is the blue packet   We will re check thyroid labs in 6 months   To help gain weight- increase protein intake  You can get a low sugar protein drink like glucerna    Nuts and meat and dairy and eggs and dried beans  Leans pork - like pork tenderloin or pork chop is good   Keep the tick bite clean and use cortisone cream  If you develop a rash or fever let us know

## 2020-07-10 NOTE — Assessment & Plan Note (Signed)
Improving whelp and itching  No fever or addn symptoms or rash  Ins to continue cortisone cream and obs

## 2020-07-10 NOTE — Progress Notes (Signed)
Subjective:    Patient ID: [name], female    DOB: Sep 04, 1940, 80 y.o.   MRN: [id number]  This visit occurred during the SARS-CoV-2 public health emergency.  Safety protocols were in place, including screening questions prior to the visit, additional usage of staff PPE, and extensive cleaning of exam room while observing appropriate contact time as indicated for disinfecting solutions.    HPI  Pt presents for amw and health mt exam with review of chronic medical problems   I have personally reviewed the Medicare Annual Wellness questionnaire and have noted 1. The patient's medical and social history 2. Their use of alcohol, tobacco or illicit drugs 3. Their current medications and supplements 4. The patient's functional ability including ADL's, fall risks, home safety risks and hearing or visual             impairment. 5. Diet and physical activities 6. Evidence for depression or mood disorders  The patients weight, height, BMI have been recorded in the chart and visual acuity is per eye clinic.  I have made referrals, counseling and provided education to the patient based review of the above and I have provided the pt with a written personalized care plan for preventive services. Reviewed and updated provider list, see scanned forms.  See scanned forms.  Routine anticipatory guidance given to patient.  See health maintenance. Colon cancer screening- cologuard neg 6/21 Breast cancer screening- has mammogram planned June 19  Self breast exam-no lumps  Flu vaccine 9/21 Tetanus vaccine Tdap 5/15 Pneumovax up to date Zoster vaccine-zostavax 4/16, had reaction so declines shingrix  covid vaccinated with booster Dexa 3/17 with bmd in nl range  Falls-none  Fractures-none  Supplements-vit D and mvi  Exercise -walking regularly  Works in the yard and garden   Advance directive-had one /may want to update and given the materials  Cognitive function addressed- see scanned forms-  and if abnormal then additional documentation follows.  Good memory occ misplaces things  No confusion and does not get lost   PMH and SH reviewed  Meds, vitals, and allergies reviewed.   ROS: See HPI.  Otherwise negative.    Weight : Wt Readings from Last 3 Encounters:  07/10/20 109 lb 5 oz (49.6 kg)  07/02/20 111 lb (50.3 kg)  11/03/19 124 lb (56.2 kg)   21.35 kg/m  Has lost weight  Drinks plenty of water  Eats well  Is active  Thinks she is eating smaller portions- kids notice it  Metformin may have caused some wt loss  Appetite is ok  Of note-after neck surg could not swallow well- that may have started it    Hearing/vision:  Hearing Screening   _0  _1  _2  _3  _4  _5  _6  _7  _8   Right ear:   40 40 40  40    Left ear:   40 40 40  40    Vision Screening Comments: Eye exam at [facility]. In Dec 2021 thinks her hearing is better than average for her age   Care team Agness Sibrian-pcp [place]-pharmacist  [facility]onski-orthopedics   HTN  bp is stable today  No cp or palpitations or headaches or edema  No side effects to medicines  BP Readings from Last 3 Encounters:  07/10/20 110/64  07/02/20 108/62  02/23/20 136/72     Pulse Readings from Last 3 Encounters:  07/10/20 69  07/02/20 80  02/23/20 83    Takes amlodipine 5 mg daily, hctz 25 mg daily and lisinopril 40  mg daily  Also supplements with klor con    DM2 Lab Results  Component Value Date   HGBA1C 6.0 06/26/2020   This is down from 6.2 Taking metformin xr 1000 mg daily  On ace and statin  Eye exam  12/21 no retinopathy   History of elevated TSH Lab Results  Component Value Date   TSH 5.89 (H) 06/26/2020   up from 4.5 and nl FT4 No changes in hair or skin  Not sluggish often    Hyperlipidemia Lab Results  Component Value Date   CHOL 171 06/26/2020   CHOL 182 06/27/2019   CHOL 171 12/19/2018   Lab Results  Component Value Date   HDL 92.30 06/26/2020   HDL  75.30 06/27/2019   HDL 75.20 12/19/2018   Lab Results  Component Value Date   LDLCALC 67 06/26/2020   LDLCALC 87 06/27/2019   LDLCALC 81 12/19/2018   Lab Results  Component Value Date   TRIG 58.0 06/26/2020   TRIG 97.0 06/27/2019   TRIG 76.0 12/19/2018   Lab Results  Component Value Date   CHOLHDL 2 06/26/2020   CHOLHDL 2 06/27/2019   CHOLHDL 2 12/19/2018   Lab Results  Component Value Date   LDLDIRECT 111.2 02/28/2013   LDLDIRECT 122.4 02/23/2012   LDLDIRECT 110.3 08/24/2011   Takes atorvastatin 10 mg daily   Calcium is 10.5  When corrected for albumin it is calculated at 10.2 -in the nl range   Lab Results  Component Value Date   CREATININE 0.87 06/26/2020   BUN 11 06/26/2020   NA 133 (L) 06/26/2020   K 3.9 06/26/2020   CL 94 (L) 06/26/2020   CO2 31 06/26/2020   Lab Results  Component Value Date   ALT 14 06/26/2020   AST 23 06/26/2020   ALKPHOS 65 06/26/2020   BILITOT 0.6 06/26/2020   Lab Results  Component Value Date   WBC 8.4 06/26/2020   HGB 12.2 06/26/2020   HCT 35.7 (L) 06/26/2020   MCV 81.8 06/26/2020   PLT 319.0 06/26/2020   Had a tick bite on R arm- feels ok   Patient Active Problem List   Diagnosis Date Noted  . Weight loss 07/10/2020  . Tick bite of right upper arm 07/02/2020  . Belching 04/07/2019  . History of cholecystectomy 03/06/2019  . History of pancreatitis 02/21/2019  . Elevated TSH 06/28/2018  . Routine general medical examination at a health care facility 04/05/2015  . Estrogen deficiency 04/05/2015  . Hypokalemia 09/04/2013  . Nonspecific abnormal electrocardiogram (ECG) (EKG) 08/30/2013  . Encounter for Medicare annual wellness exam 03/07/2013  . Fullness of supraclavicular fossa 02/27/2011  . Other screening mammogram 02/25/2011  . Gynecological examination 02/25/2011  . Cervical radiculopathy 03/31/2010  . DM type 2 (diabetes mellitus, type 2) ([place]) 07/06/2007  . FIBROIDS, UTERUS 03/23/2007  . Hyperlipidemia  associated with type 2 diabetes mellitus ([place]) 03/23/2007  . Essential hypertension 03/23/2007  . POSTMENOPAUSAL STATUS 03/23/2007   Past Medical History:  Diagnosis Date  . Acute pancreatitis 02/22/2019  . 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 Fibroids   . Gallstones    incidental asymptomatic gallstones  . Heart murmur   . Hyperlipidemia   . Hypertension    Past Surgical History:  Procedure Laterality Date  . ANTERIOR CERVICAL DECOMPRESSION/DISCECTOMY FUSION 4 LEVELS N/A 09/21/2019   Procedure: ANTERIOR CERVICAL DECOMPRESSION FUSION CERVICAL THREE-FOUR, CERVICAL FOUR-FIVE, CERVICAL FIVE-SIX WITH INSTRUMENTATION AND ALLOGRAFT;  Surgeon: Phylliss Bob, MD;  Location:  [facility];  Service: Orthopedics;  Laterality: N/A;  . CHOLECYSTECTOMY N/A 02/24/2019   Procedure: LAPAROSCOPIC CHOLECYSTECTOMY;  Surgeon: Ileana Roup, MD;  Location: [facility];  Service: General;  Laterality: N/A;  . NO PAST SURGERIES     Social History   Tobacco Use  . Smoking status: Never Smoker  . Smokeless tobacco: Never Used  Vaping Use  . Vaping Use: Never used  Substance Use Topics  . Alcohol use: No    Alcohol/week: 0.0 standard drinks  . Drug use: No   Family History  Problem Relation Age of Onset  . Hypertension Mother   . Diabetes Mother   . Osteoporosis Sister   . Diabetes Brother    Allergies  Allergen Reactions  . Ibuprofen Palpitations  . Sulfamethoxazole-Trimethoprim Rash   Current Outpatient Medications on File Prior to Visit  Medication Sig Dispense Refill  . ACCU-CHEK AVIVA PLUS test strip USE TO CHECK BLOOD SUGAR ONCE DAILY (DX. E11.9) 50 strip 1  . ACCU-CHEK FASTCLIX LANCETS MISC Check glucose once daily (Dx. E11.9) 102 each 1  . Accu-Chek Softclix Lancets lancets To check glucose daily and as needed for diabetes type 2 100 each 5  . Blood Glucose Monitoring Suppl (ACCU-CHEK AVIVA PLUS) w/Device KIT Use to check glucose once daily for DM 2 (Dx. E11.9) 1 kit 0  . cholecalciferol  (VITAMIN D) 1000 UNITS tablet Take 1,000 Units by mouth daily.    . Emollient (EUCERIN) lotion Apply 1 Bottle topically daily as needed for dry skin.    . Multiple Vitamin (MULTIVITAMIN) tablet Take 1 tablet by mouth daily.    Vladimir Faster Glycol-Propyl Glycol (SYSTANE FREE OP) Place 2 drops into both eyes daily.     No current facility-administered medications on file prior to visit.    Review of Systems  Constitutional: Positive for unexpected weight change. Negative for activity change, appetite change, fatigue and fever.  HENT: Negative for congestion, ear pain, rhinorrhea, sinus pressure and sore throat.   Eyes: Negative for pain, redness and visual disturbance.  Respiratory: Negative for cough, shortness of breath and wheezing.   Cardiovascular: Negative for chest pain and palpitations.  Gastrointestinal: Negative for abdominal pain, blood in stool, constipation and diarrhea.  Endocrine: Negative for polydipsia and polyuria.  Genitourinary: Negative for dysuria, frequency and urgency.  Musculoskeletal: Negative for arthralgias, back pain and myalgias.  Skin: Negative for pallor and rash.  Allergic/Immunologic: Negative for environmental allergies.  Neurological: Negative for dizziness, syncope and headaches.  Hematological: Negative for adenopathy. Does not bruise/bleed easily.  Psychiatric/Behavioral: Negative for decreased concentration and dysphoric mood. The patient is not nervous/anxious.        Objective:   Physical Exam Constitutional:      General: She is not in acute distress.    Appearance: Normal appearance. She is well-developed and normal weight. She is not ill-appearing or diaphoretic.  HENT:     Head: Normocephalic and atraumatic.     Right Ear: Tympanic membrane, ear canal and external ear normal.     Left Ear: Tympanic membrane, ear canal and external ear normal.     Nose: Nose normal. No congestion.     Mouth/Throat:     Mouth: Mucous membranes are moist.      Pharynx: Oropharynx is clear. No posterior oropharyngeal erythema.  Eyes:     General: No scleral icterus.    Extraocular Movements: Extraocular movements intact.     Conjunctiva/sclera: Conjunctivae normal.     Pupils: Pupils are equal, round,  and reactive to light.  Neck:     Thyroid: No thyromegaly.     Vascular: No carotid bruit or JVD.  Cardiovascular:     Rate and Rhythm: Normal rate and regular rhythm.     Pulses: Normal pulses.     Heart sounds: Normal heart sounds. No gallop.   Pulmonary:     Effort: Pulmonary effort is normal. No respiratory distress.     Breath sounds: Normal breath sounds. No wheezing.     Comments: Good air exch Chest:     Chest wall: No tenderness.  Abdominal:     General: Bowel sounds are normal. There is no distension or abdominal bruit.     Palpations: Abdomen is soft. There is no mass.     Tenderness: There is no abdominal tenderness.     Hernia: No hernia is present.  Genitourinary:    Comments: Breast exam: No mass, nodules, thickening, tenderness, bulging, retraction, inflamation, nipple discharge or skin changes noted.  No axillary or clavicular LA.     Musculoskeletal:        General: No tenderness. Normal range of motion.     Cervical back: Normal range of motion and neck supple. No rigidity. No muscular tenderness.     Right lower leg: No edema.     Left lower leg: No edema.     Comments: No kyphosis   Lymphadenopathy:     Cervical: No cervical adenopathy.  Skin:    General: Skin is warm and dry.     Coloration: Skin is not pale.     Findings: No erythema or rash.  Neurological:     Mental Status: She is alert. Mental status is at baseline.     Cranial Nerves: No cranial nerve deficit.     Motor: No abnormal muscle tone.     Coordination: Coordination normal.     Gait: Gait normal.     Deep Tendon Reflexes: Reflexes are normal and symmetric. Reflexes normal.  Psychiatric:        Mood and Affect: Mood normal.        Cognition  and Memory: Cognition and memory normal.           Assessment & Plan:   Problem List Items Addressed This Visit      Cardiovascular and Mediastinum   Essential hypertension    bp in fair control at this time  BP Readings from Last 1 Encounters:  07/10/20 110/64   No changes needed Most recent labs reviewed  Disc lifstyle change with low sodium diet and exercise  Plan to continue amlodipine 5 mg daily  hctz 25 mg daily  Lisinopril 40 mg daily      Relevant Medications   lisinopril (ZESTRIL) 40 MG tablet   hydrochlorothiazide (HYDRODIURIL) 25 MG tablet   atorvastatin (LIPITOR) 10 MG tablet   amLODipine (NORVASC) 5 MG tablet     Endocrine   Hyperlipidemia associated with type 2 diabetes mellitus (HCC)    Disc goals for lipids and reasons to control them Rev last labs with pt Rev low sat fat diet in detail  LDL of 67 -improved with very high HDL  Plan to continue atorvastatin 10 mg daily         Relevant Medications   metFORMIN (GLUCOPHAGE-XR) 500 MG 24 hr tablet   lisinopril (ZESTRIL) 40 MG tablet   atorvastatin (LIPITOR) 10 MG tablet   DM type 2 (diabetes mellitus, type 2) ([place])    Lab Results  Component  Value Date   HGBA1C 6.0 06/26/2020   Very well controlled  Plan to continue metformin xr 1000 mg daily with good diet  Encouraged good protein intake  On ace and statin  Eye exam utd Good foot care      Relevant Medications   metFORMIN (GLUCOPHAGE-XR) 500 MG 24 hr tablet   lisinopril (ZESTRIL) 40 MG tablet   atorvastatin (LIPITOR) 10 MG tablet     Musculoskeletal and Integument   Tick bite of right upper arm    Improving whelp and itching  No fever or addn symptoms or rash  Ins to continue cortisone cream and obs        Other   Encounter for Medicare annual wellness exam - Primary    Reviewed health habits including diet and exercise and skin cancer prevention Reviewed appropriate screening tests for age  Also reviewed health mt list, fam hx  and immunization status , as well as social and family history   See HPI Labs reviewed  cologuard utd 6/21 Mammogram planned 08/04/20  Declines shingrix due to past reaction  covid vaccinated with booster  Nl dexa 3/17   No falls or fractures and takes vit D Advance directive in place, materials given to use if she wants to update it  No cognitive concerns Nl hearing screen  utd eye and vision care      Routine general medical examination at a health care facility    Reviewed health habits including diet and exercise and skin cancer prevention Reviewed appropriate screening tests for age  Also reviewed health mt list, fam hx and immunization status , as well as social and family history   See HPI Labs reviewed  cologuard utd 6/21 Mammogram planned 08/04/20  Declines shingrix due to past reaction  covid vaccinated with booster  Nl dexa 3/17   No falls or fractures and takes vit D Advance directive in place, materials given to use if she wants to update it  No cognitive concerns Nl hearing screen  utd eye and vision care      Elevated TSH    Lab Results  Component Value Date   TSH 5.89 (H) 06/26/2020    Overall not much clinical change  Will re check in 6 mo with thyroid profile and treat if needed       Relevant Orders   TSH   T4, free   T3   Weight loss    This occurred after neck surgery when she could not swallow well  Not trying to gain it back Disc ways to inc protein in diet (including supplemental protein drinks)  Will continue to follow  Mood and appetite are good

## 2020-07-10 NOTE — Assessment & Plan Note (Signed)
This occurred after neck surgery when she could not swallow well  Not trying to gain it back Disc ways to inc protein in diet (including supplemental protein drinks)  Will continue to follow  Mood and appetite are good

## 2020-07-20 ENCOUNTER — Other Ambulatory Visit: Payer: Self-pay | Admitting: Family Medicine

## 2020-08-02 ENCOUNTER — Encounter: Payer: Self-pay | Admitting: Family Medicine

## 2020-08-02 DIAGNOSIS — Z1231 Encounter for screening mammogram for malignant neoplasm of breast: Secondary | ICD-10-CM | POA: Diagnosis not present

## 2020-09-16 ENCOUNTER — Telehealth: Payer: Medicare Other

## 2020-09-16 ENCOUNTER — Telehealth: Payer: Self-pay

## 2020-09-16 NOTE — Telephone Encounter (Signed)
Yes, that is a good idea, would like to cut back  Thanks for the heads up

## 2020-09-16 NOTE — Telephone Encounter (Signed)
Patient confirms understanding. She will call with an update in a few weeks.

## 2020-09-16 NOTE — Telephone Encounter (Signed)
Patient reports continued weight loss. She was 98 lbs on home scale this morning, after breakfast with house coat on. Reports good appetite. She continues metformin 500 mg - 2 tabs daily. Fasting BG 96-107. I would suggest cutting back on the metformin to 500 mg/day considering ongoing weight loss and BG control. Let me know your thoughts.  Eating habits -  Breakfast - oatmeal or egg with [place] sausage Mid-morning Snack - PB crackers, fruit, or granola bar Lunch - pb sandwich, apple sauce Supper - veggie, chicken, potatoes Bedtime snack - pb crackers, nuts Glucerna cost was too high  Debbora Dus, PharmD Corporate treasurer Primary Care at [facility] [phone]

## 2020-09-18 ENCOUNTER — Telehealth: Payer: Medicare Other

## 2020-11-11 ENCOUNTER — Other Ambulatory Visit: Payer: Self-pay

## 2020-11-11 ENCOUNTER — Encounter: Payer: Self-pay | Admitting: Emergency Medicine

## 2020-11-11 ENCOUNTER — Ambulatory Visit
Admission: EM | Admit: 2020-11-11 | Discharge: 2020-11-11 | Disposition: A | Discharge status: Home / Self Care | Payer: Medicare Other | Attending: Emergency Medicine | Admitting: Emergency Medicine

## 2020-11-11 DIAGNOSIS — L03115 Cellulitis of right lower limb: Secondary | ICD-10-CM

## 2020-11-11 MED ORDER — CEPHALEXIN 500 MG PO CAPS: 500.0000 mg | ORAL_CAPSULE | Freq: Four times a day (QID) | ORAL | 0 refills | Status: AC

## 2020-11-11 MED ORDER — MUPIROCIN 2 % EX OINT: 1.0000 "application " | TOPICAL_OINTMENT | Freq: Two times a day (BID) | CUTANEOUS | 0 refills | Status: AC

## 2020-11-11 NOTE — ED Provider Notes (Signed)
Chief Complaint   Chief Complaint  Patient presents with   Wound Check     Subjective, HPI  [name] is a very pleasant 80 y.o. female who presents with concerns for infection to the wound of the right lower leg which started about a month ago.  Patient says that she thought the area was getting better, but has since started to ache and look worse over about the last week.  She does not report any fever, vomiting or additional symptoms today.  History obtained from patient.   Patient's problem list, past medical and social history, medications, and allergies were reviewed by me and updated in Epic.    ROS  See HPI.  Objective   Vitals:   11/11/20 1458  BP: 123/69  Pulse: 72  Resp: 20  Temp: 98 F (36.7 C)  SpO2: 98%    Vital signs and nursing note reviewed.  General: Appears well-developed and well-nourished. No acute distress.  HEENT: Normocephalic, atraumatic, hearing grossly intact. EOMI, no drainage. No rhinorrhea. Moist mucous membranes.  Neck: Normal range of motion, neck is supple.  Cardiovascular: Normal rate.  Pulm/Chest: No respiratory distress.   Musculoskeletal: No joint deformity, normal range of motion.  Skin: Circular area of erythema noted to right lateral lower extremity measuring approximately 3 cm x 3 cm with a central raised scabbed area.  Data  No results found for any visits on 11/11/20.    Assessment & Plan  1. Cellulitis of leg, right - cephALEXin (KEFLEX) 500 MG capsule; Take 1 capsule (500 mg total) by mouth 4 (four) times daily for 7 days.  Dispense: 28 capsule; Refill: 0 - mupirocin ointment (BACTROBAN) 2 %; Apply 1 application topically 2 (two) times daily for 10 days.  Dispense: 22 g; Refill: 0  80 y.o. female presents with concerns for infection to the wound of the right lower leg which started about a month ago.  Patient says that she thought the area was getting better, but has since started to ache and look worse over about the  last week.  She does not report any fever, vomiting or additional symptoms today.  Given symptoms along with assessment findings, likely cellulitis to the right lower extremity.  Did remove the scab today in clinic as this will help her wound healing.  Cover the area with bacitracin ointment and a Band-Aid.  Rx'd Keflex and Bactroban to the patient's preferred pharmacy and advised about home treatment and care to include ice to the area to help with inflammation, increased fluid intake and how to properly dress her wound.  Advised to follow-up with her PCP if area does not improve in 5 to 6 days.  Follow-up with PCP or return to clinic sooner if area worsens or she develops a fever.  Patient verbalized understanding and agreed with plan.  Patient stable upon discharge.  Return as needed.  Plan:   Discharge Instructions      Take/use all medications as prescribed (Keflex and use Bactroban ointment). Keep area clean and dry.  Also keep area covered with a Band-Aid as it heals.  If you begin to notice any new scabs, remove them as directed. Increase fluid intake. You may apply ice wrapped in a towel to affected area 3-5 times daily for 10-15 minute intervals. See your PCP if area does not improve in 5-6 days. See your PCP or return to clinic sooner if area worsens or you develop fever.  Serafina Royals, [place] 11/11/20 1523

## 2020-11-11 NOTE — Discharge Instructions (Addendum)
Take/use all medications as prescribed (Keflex and use Bactroban ointment). Keep area clean and dry.  Also keep area covered with a Band-Aid as it heals.  If you begin to notice any new scabs, remove them as directed. Increase fluid intake. You may apply ice wrapped in a towel to affected area 3-5 times daily for 10-15 minute intervals. See your PCP if area does not improve in 5-6 days. See your PCP or return to clinic sooner if area worsens or you develop fever.

## 2020-11-11 NOTE — ED Triage Notes (Signed)
Pt has a wound on the right lower lateral leg x 1 month. Thought it was getting better, but has started to ache and look worse over the last week.

## 2020-12-17 ENCOUNTER — Other Ambulatory Visit: Payer: Self-pay | Admitting: Family Medicine

## 2021-01-13 ENCOUNTER — Other Ambulatory Visit (INDEPENDENT_AMBULATORY_CARE_PROVIDER_SITE_OTHER): Payer: Medicare Other

## 2021-01-13 ENCOUNTER — Other Ambulatory Visit: Payer: Self-pay

## 2021-01-13 ENCOUNTER — Other Ambulatory Visit: Payer: Medicare Other

## 2021-01-13 DIAGNOSIS — R7989 Other specified abnormal findings of blood chemistry: Secondary | ICD-10-CM

## 2021-01-13 LAB — T3: T3, Total: 72 ng/dL — ABNORMAL LOW (ref 76–181)

## 2021-01-14 LAB — TSH: TSH: 2.93 u[IU]/mL (ref 0.35–5.50)

## 2021-01-14 LAB — T4, FREE: Free T4: 0.8 ng/dL (ref 0.60–1.60)

## 2021-01-17 ENCOUNTER — Encounter: Payer: Self-pay | Admitting: Emergency Medicine

## 2021-01-17 ENCOUNTER — Ambulatory Visit
Admission: EM | Admit: 2021-01-17 | Discharge: 2021-01-17 | Disposition: A | Discharge status: Home / Self Care | Payer: Medicare Other | Attending: Emergency Medicine | Admitting: Emergency Medicine

## 2021-01-17 ENCOUNTER — Telehealth: Payer: Self-pay

## 2021-01-17 ENCOUNTER — Other Ambulatory Visit: Payer: Self-pay

## 2021-01-17 DIAGNOSIS — U071 COVID-19: Secondary | ICD-10-CM

## 2021-01-17 DIAGNOSIS — R051 Acute cough: Secondary | ICD-10-CM

## 2021-01-17 MED ORDER — MOLNUPIRAVIR EUA 200MG CAPSULE: 4.0000 | ORAL_CAPSULE | Freq: Two times a day (BID) | ORAL | 0 refills | Status: AC

## 2021-01-17 MED ORDER — BENZONATATE 100 MG PO CAPS: 100.0000 mg | ORAL_CAPSULE | Freq: Three times a day (TID) | ORAL | 0 refills | Status: DC | PRN

## 2021-01-17 NOTE — Discharge Instructions (Signed)
Take the molnupiravir as directed for COVID.  Take the [facility] as needed for cough.  You should self-quarantine according to the CDC guidelines.  See attached.    Go to the emergency department if you have shortness of breath or other concerning symptoms.    Follow-up with your primary care provider.

## 2021-01-17 NOTE — ED Provider Notes (Signed)
[facility]    CSN: [id number] Arrival date & time: 01/17/21  1055      History   Chief Complaint Chief Complaint  Patient presents with   Cough   Generalized Body Aches    HPI [name] is a 80 y.o. female.  Patient presents with nonproductive cough that started on 01/15/2021.  She tested positive for COVID yesterday at home.  She contacted her PCPs office to discuss treatment and was instructed to come here.  She denies fever, chills, shortness of breath, or symptoms.  She has been treating her cough with Coricidin HBP.  Her medical history includes diabetes and hypertension.  The history is provided by the patient and medical records.   Past Medical History:  Diagnosis Date   Acute pancreatitis 02/22/2019   Diabetes mellitus 05/09   type II   Fibroids    Gallstones    incidental asymptomatic gallstones   Heart murmur    Hyperlipidemia    Hypertension     Patient Active Problem List   Diagnosis Date Noted   Weight loss 07/10/2020   Tick bite of right upper arm 07/02/2020   Belching 04/07/2019   History of cholecystectomy 03/06/2019   History of pancreatitis 02/21/2019   Elevated TSH 06/28/2018   Routine general medical examination at a health care facility 04/05/2015   Estrogen deficiency 04/05/2015   Hypokalemia 09/04/2013   Nonspecific abnormal electrocardiogram (ECG) (EKG) 08/30/2013   Encounter for Medicare annual wellness exam 03/07/2013   Fullness of supraclavicular fossa 02/27/2011   Other screening mammogram 02/25/2011   Gynecological examination 02/25/2011   Cervical radiculopathy 03/31/2010   DM type 2 (diabetes mellitus, type 2) ([place]) 07/06/2007   FIBROIDS, UTERUS 03/23/2007   Hyperlipidemia associated with type 2 diabetes mellitus ([place]) 03/23/2007   Essential hypertension 03/23/2007   POSTMENOPAUSAL STATUS 03/23/2007    Past Surgical History:  Procedure Laterality Date   ANTERIOR CERVICAL DECOMPRESSION/DISCECTOMY FUSION 4  LEVELS N/A 09/21/2019   Procedure: ANTERIOR CERVICAL DECOMPRESSION FUSION CERVICAL THREE-FOUR, CERVICAL FOUR-FIVE, CERVICAL FIVE-SIX WITH INSTRUMENTATION AND ALLOGRAFT;  Surgeon: Phylliss Bob, MD;  Location: [place];  Service: Orthopedics;  Laterality: N/A;   CHOLECYSTECTOMY N/A 02/24/2019   Procedure: LAPAROSCOPIC CHOLECYSTECTOMY;  Surgeon: Ileana Roup, MD;  Location: [place];  Service: General;  Laterality: N/A;   NO PAST SURGERIES      OB History   No obstetric history on file.      Home Medications    Prior to Admission medications   Medication Sig Start Date End Date Taking? Authorizing Provider  benzonatate (TESSALON) 100 MG capsule Take 1 capsule (100 mg total) by mouth 3 (three) times daily as needed for cough. 01/17/21  Yes Sharion Balloon, NP  molnupiravir EUA (LAGEVRIO) 200 mg CAPS capsule Take 4 capsules (800 mg total) by mouth 2 (two) times daily for 5 days. 01/17/21 01/22/21 Yes Sharion Balloon, NP  ACCU-CHEK AVIVA PLUS test strip USE TO CHECK BLOOD SUGAR ONCE DAILY (DX. E11.9) 12/18/20   Tower, Wynelle Fanny, MD  ACCU-CHEK FASTCLIX LANCETS MISC Check glucose once daily (Dx. E11.9) 06/21/17   Tower, Wynelle Fanny, MD  Accu-Chek Softclix Lancets lancets To check glucose daily and as needed for diabetes type 2 05/09/19   Tower, Wynelle Fanny, MD  amLODipine (NORVASC) 5 MG tablet Take 1 tablet (5 mg total) by mouth daily. 07/10/20   Tower, Wynelle Fanny, MD  atorvastatin (LIPITOR) 10 MG tablet Take 1 tablet (10 mg total) by mouth daily. In evening  07/10/20   Tower, Wynelle Fanny, MD  Blood Glucose Monitoring Suppl (ACCU-CHEK AVIVA PLUS) w/Device KIT Use to check glucose once daily for DM 2 (Dx. E11.9) 01/23/19   Tower, Wynelle Fanny, MD  cholecalciferol (VITAMIN D) 1000 UNITS tablet Take 1,000 Units by mouth daily.    [provider]  Emollient (EUCERIN) lotion Apply 1 Bottle topically daily as needed for dry skin.    [provider]  hydrochlorothiazide (HYDRODIURIL) 25 MG tablet TAKE 1 TABLET BY MOUTH  EVERY DAY IN THE MORNING 07/10/20   Tower, Wynelle Fanny, MD  lisinopril (ZESTRIL) 40 MG tablet Take 1 tablet (40 mg total) by mouth daily. 07/10/20   Tower, Wynelle Fanny, MD  metFORMIN (GLUCOPHAGE-XR) 500 MG 24 hr tablet TAKE 2 TABLETS BY MOUTH EVERY DAY WITH BREAKFAST 07/10/20   Tower, Wynelle Fanny, MD  Multiple Vitamin (MULTIVITAMIN) tablet Take 1 tablet by mouth daily.    [provider]  Polyethyl Glycol-Propyl Glycol (SYSTANE FREE OP) Place 2 drops into both eyes daily.    [provider]  potassium chloride SA (KLOR-CON M20) 20 MEQ tablet Take 1 tablet (20 mEq total) by mouth daily. 07/10/20   Tower, Wynelle Fanny, MD    Family History Family History  Problem Relation Age of Onset   Hypertension Mother    Diabetes Mother    Osteoporosis Sister    Diabetes Brother     Social History Social History   Tobacco Use   Smoking status: Never   Smokeless tobacco: Never  Vaping Use   Vaping Use: Never used  Substance Use Topics   Alcohol use: No    Alcohol/week: 0.0 standard drinks   Drug use: No     Allergies   Ibuprofen and Sulfamethoxazole-trimethoprim   Review of Systems Review of Systems  Constitutional:  Negative for chills and fever.  HENT:  Negative for ear pain and sore throat.   Respiratory:  Positive for cough. Negative for shortness of breath.   Cardiovascular:  Negative for chest pain and palpitations.  Gastrointestinal:  Negative for diarrhea and vomiting.  Skin:  Negative for color change and rash.  All other systems reviewed and are negative.   Physical Exam Triage Vital Signs ED Triage Vitals  Enc Vitals Group     BP      Pulse      Resp      Temp      Temp src      SpO2      Weight      Height      Head Circumference      Peak Flow      Pain Score      Pain Loc      Pain Edu?      Excl. in [place]?    No data found.  Updated Vital Signs BP 135/66   Pulse 77   Temp 98.4 F (36.9 C)   Resp 18   SpO2 98%   Visual Acuity Right Eye Distance:    Left Eye Distance:   Bilateral Distance:    Right Eye Near:   Left Eye Near:    Bilateral Near:     Physical Exam Vitals and nursing note reviewed.  Constitutional:      General: She is not in acute distress.    Appearance: She is well-developed. She is not ill-appearing.  HENT:     Head: Normocephalic and atraumatic.     Right Ear: Tympanic membrane normal.  Left Ear: Tympanic membrane normal.     Nose: Nose normal.     Mouth/Throat:     Mouth: Mucous membranes are moist.     Pharynx: Oropharynx is clear.  Cardiovascular:     Rate and Rhythm: Normal rate and regular rhythm.     Heart sounds: Normal heart sounds.  Pulmonary:     Effort: Pulmonary effort is normal. No respiratory distress.     Breath sounds: Normal breath sounds.  Abdominal:     Palpations: Abdomen is soft.     Tenderness: There is no abdominal tenderness.  Musculoskeletal:     Cervical back: Neck supple.  Skin:    General: Skin is warm and dry.  Neurological:     Mental Status: She is alert.  Psychiatric:        Mood and Affect: Mood normal.        Behavior: Behavior normal.     UC Treatments / Results  Labs (all labs ordered are listed, but only abnormal results are displayed) Labs Reviewed - No data to display  EKG   Radiology No results found.  Procedures Procedures (including critical care time)  Medications Ordered in UC Medications - No data to display  Initial Impression / Assessment and Plan / UC Course  I have reviewed the triage vital signs and the nursing notes.  Pertinent labs & imaging results that were available during my care of the patient were reviewed by me and considered in my medical decision making (see chart for details).     COVID-19, cough.  Last CMP in chart from May 2022.  Treating with molnupiravir.  Discussed the side effects of molnupiravir, including diarrhea, nausea, dizziness.  Instructed patient to notify her PCP that she is taking molnupiravir for  COVID.  Treating cough with Tessalon Perles.  ED precautions discussed.  Instructed patient to self quarantine per CDC guidelines.  Patient agrees to plan of care.    Final Clinical Impressions(s) / UC Diagnoses   Final diagnoses:  COVID-19  Acute cough     Discharge Instructions      Take the molnupiravir as directed for COVID.  Take the [facility] as needed for cough.  You should self-quarantine according to the CDC guidelines.  See attached.    Go to the emergency department if you have shortness of breath or other concerning symptoms.    Follow-up with your primary care provider.         ED Prescriptions     Medication Sig Dispense Auth. Provider   molnupiravir EUA (LAGEVRIO) 200 mg CAPS capsule Take 4 capsules (800 mg total) by mouth 2 (two) times daily for 5 days. 40 capsule Sharion Balloon, NP   benzonatate (TESSALON) 100 MG capsule Take 1 capsule (100 mg total) by mouth 3 (three) times daily as needed for cough. 21 capsule Sharion Balloon, NP      PDMP not reviewed this encounter.   Sharion Balloon, NP 01/17/21 1200

## 2021-01-17 NOTE — Telephone Encounter (Signed)
An in person visit seems warranted at her age to decide if antiviral medication should be considered

## 2021-01-17 NOTE — Telephone Encounter (Signed)
[facility] - Client TELEPHONE ADVICE RECORD [facility] Patient Name: [name] [facility] Gender: Female DOB: 1940-10-26 Age: 80 Y 24 M 4 D Return Phone Number: [phone] (Primary), [phone] (Secondary) Address[place]  [address] Client [facility] - Client Client Site [place] Provider Tower, Roque Lias - MD Contact Type Call Who Is Calling Patient / Member / Family / Caregiver Call Type Triage / Clinical Relationship To Patient Self Return Phone Number [phone] (Secondary) Chief Complaint Cough Reason for Call Symptomatic / Request for [place] states that she tested positive for covid yesterday. Caller was transferred as the office has no available appts today. Caller has a cough, no other symptoms at this time. [place] is going to UC close to her home. Can't remember the name. Translation No Nurse Assessment Nurse: Zorita Pang, RN, Deborah Date/Time (Eastern Time): 01/17/2021 9:33:02 AM Confirm and document reason for call. If symptomatic, describe symptoms. ---The caller states that she started coughing on Tuesday. Non-productive. Tested positive yesterday. No fever and no other symptoms. Does the patient have any new or worsening symptoms? ---Yes Will a triage be completed? ---Yes Related visit to physician within the last 2 weeks? ---No Does the PT have any chronic conditions? (i.e. diabetes, asthma, this includes High risk factors for pregnancy, etc.) ---Yes List chronic conditions. ---diabetes, hypertension Is this a behavioral health or substance abuse call? ---No Guidelines Guideline Title Affirmed Question Affirmed Notes Nurse Date/Time (Eastern Time) COVID-19 - Diagnosed or Suspected [1] HIGH RISK for severe COVID complications (e.g., weak immune system, age > 51 years, obesity  with BMI 30 or higher, [place]om[place], RN, Neoma Laming 01/17/2021 9:34:47 AM PLEASE NOTE: All timestamps contained within this report are represented as [place] Standard Time. CONFIDENTIALTY NOTICE: This fax transmission is intended only for the addressee. It contains information that is legally privileged, confidential or otherwise protected from use or disclosure. If you are not the intended recipient, you are strictly prohibited from reviewing, disclosing, copying using or disseminating any of this information or taking any action in reliance on or regarding this information. If you have received this fax in error, please notify us immediately by telephone so that we can arrange for its return to [place]. Phone: [phone], Toll-Free: [phone], Fax: [phone] Page: 2 of 2 Call Id: [id number] Guidelines Guideline Title Affirmed Question Affirmed Notes Nurse Date/Time Eilene Ghazi Time) pregnant, chronic lung disease or other chronic medical condition) AND [2] COVID symptoms (e.g., cough, fever) (Exceptions: Already seen by PCP and no new or worsening symptoms.) Disp. Time Eilene Ghazi Time) Disposition Final User 01/17/2021 9:44:54 AM Call PCP within 24 Hours Yes Zorita Pang, RN, Garrel Ridgel Disagree/Comply Comply Caller Understands Yes PreDisposition Call Doctor Care Advice Given Per Guideline CALL PCP WITHIN 24 HOURS: * Telemedicine may be your best choice for care during this COVID-19 outbreak. * HOME REMEDY - HONEY: This old home remedy has been shown to help decrease coughing at night. The adult dosage is 2 teaspoons (10 ml) at bedtime. * You become worse Comments User: Marquis Buggy, RN Date/Time Eilene Ghazi Time): 01/17/2021 9:35:19 AM She just found out that someone from church tested positive for COVID. Referrals GO TO FACILITY OTHER - [facility]

## 2021-01-17 NOTE — Telephone Encounter (Signed)
Per chart review tab pt is at [facility]. Sending note to Dr Glori Bickers who is out of office but cking basket, Dr Silvio Pate who is in office and [place] CMA.

## 2021-01-17 NOTE — ED Triage Notes (Signed)
Pt here with positive home COVID test with sx starting 2 days ago. She had a direct exposure to COVID at church last Sunday. She is here today to inquire about antivirals and tx for her cough which keeps her up at night.

## 2021-01-17 NOTE — Telephone Encounter (Signed)
Aware, will watch for correspondence  

## 2021-02-07 ENCOUNTER — Other Ambulatory Visit: Payer: Self-pay | Admitting: Family Medicine

## 2021-02-28 DIAGNOSIS — E119 Type 2 diabetes mellitus without complications: Secondary | ICD-10-CM | POA: Diagnosis not present

## 2021-02-28 LAB — HM DIABETES EYE EXAM

## 2021-04-17 ENCOUNTER — Encounter: Payer: Self-pay | Admitting: Family Medicine

## 2021-05-01 IMAGING — RF DG C-ARM 1-60 MIN
1 series · 1 of 1 positions shown · non-contrast
Comparison: 07/28/2019

CLINICAL DATA: Cervical fusion

EXAM:
CERVICAL SPINE - 2-3 VIEW; DG C-ARM 1-60 MIN

[Series 1: run · 1 of 1 slices shown]
[im 1/1]
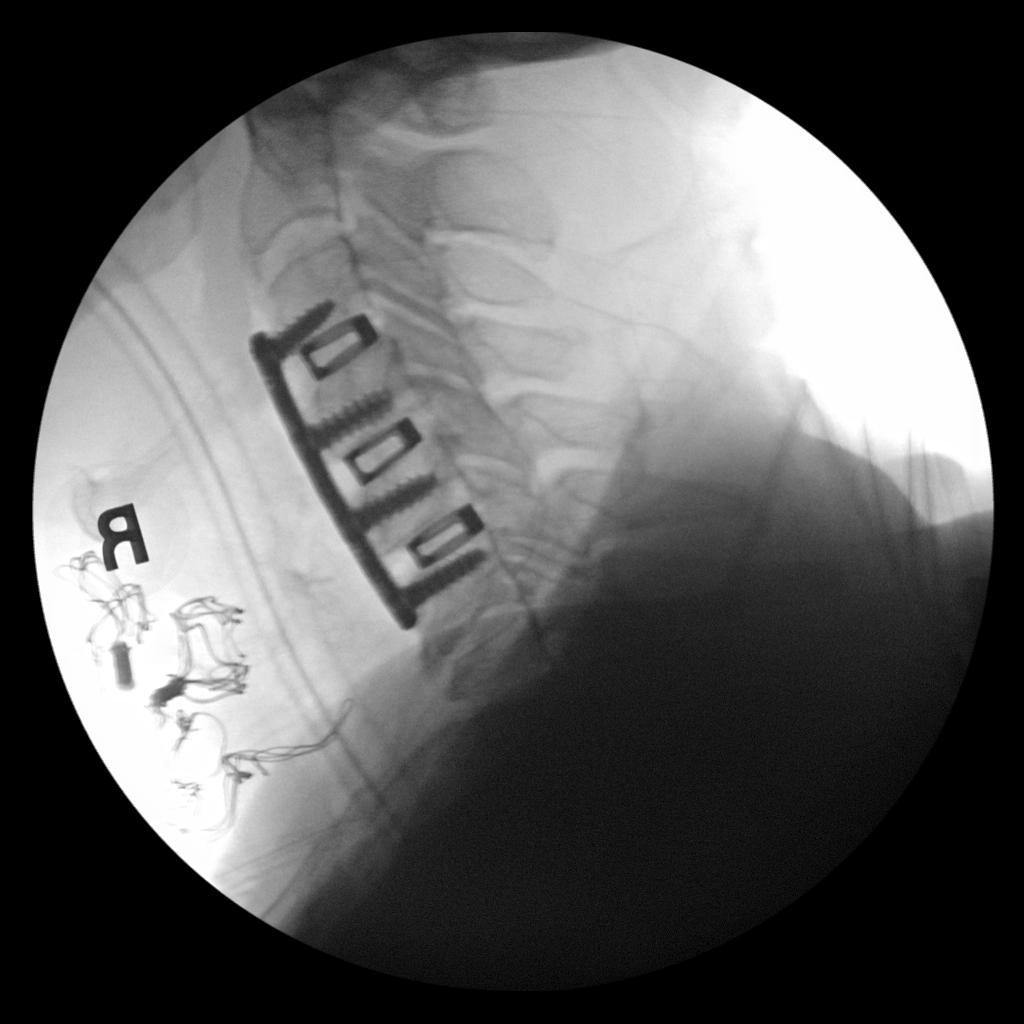

[1 of 1 positions shown; findings below may reference images not displayed]

FINDINGS: A single C-arm fluoroscopic image of the lateral cervical spine was
obtained intraoperatively and submitted for post operative
interpretation. Interval placement of C3-C6 ACDF hardware which
appears well seated. Endotracheal tube is visualized. 8 seconds of
fluoroscopy time was utilized. Please see the performing provider's
procedural report for further detail.
IMPRESSION: As above.

## 2021-05-14 ENCOUNTER — Other Ambulatory Visit: Payer: Self-pay | Admitting: Family Medicine

## 2021-06-03 ENCOUNTER — Telehealth: Payer: Self-pay | Admitting: Family Medicine

## 2021-06-03 DIAGNOSIS — E1169 Type 2 diabetes mellitus with other specified complication: Secondary | ICD-10-CM

## 2021-06-03 DIAGNOSIS — R7989 Other specified abnormal findings of blood chemistry: Secondary | ICD-10-CM

## 2021-06-03 DIAGNOSIS — E119 Type 2 diabetes mellitus without complications: Secondary | ICD-10-CM

## 2021-06-03 DIAGNOSIS — I10 Essential (primary) hypertension: Secondary | ICD-10-CM

## 2021-06-03 NOTE — Telephone Encounter (Signed)
-----   Message from Velna Hatchet, RT sent at 05/19/2021 10:16 AM EDT ----- ?Regarding: Lab orders for appt on Wednesday, 06/04/2021 ?Patient is scheduled for cpx, please order future labs.  Thanks, Anda Kraft  ? ?

## 2021-06-04 ENCOUNTER — Other Ambulatory Visit (INDEPENDENT_AMBULATORY_CARE_PROVIDER_SITE_OTHER): Payer: Medicare Other

## 2021-06-04 DIAGNOSIS — I10 Essential (primary) hypertension: Secondary | ICD-10-CM

## 2021-06-04 DIAGNOSIS — E1169 Type 2 diabetes mellitus with other specified complication: Secondary | ICD-10-CM | POA: Diagnosis not present

## 2021-06-04 DIAGNOSIS — R7989 Other specified abnormal findings of blood chemistry: Secondary | ICD-10-CM | POA: Diagnosis not present

## 2021-06-04 DIAGNOSIS — E119 Type 2 diabetes mellitus without complications: Secondary | ICD-10-CM

## 2021-06-04 DIAGNOSIS — E785 Hyperlipidemia, unspecified: Secondary | ICD-10-CM | POA: Diagnosis not present

## 2021-06-04 LAB — LIPID PANEL
Cholesterol: 154 mg/dL (ref 0–200)
HDL: 85.2 mg/dL (ref >39.00)
LDL Cholesterol: 57 mg/dL (ref 0–99)
NonHDL: 68.87
Total CHOL/HDL Ratio: 2
Triglycerides: 60 mg/dL (ref 0.0–149.0)
VLDL: 12 mg/dL (ref 0.0–40.0)

## 2021-06-04 LAB — CBC WITH DIFFERENTIAL/PLATELET
Basophils Absolute: 0.1 10*3/uL (ref 0.0–0.1)
Basophils Relative: 1 % (ref 0.0–3.0)
Eosinophils Absolute: 0.3 10*3/uL (ref 0.0–0.7)
Eosinophils Relative: 4.2 % (ref 0.0–5.0)
HCT: 37.5 % (ref 36.0–46.0)
Hemoglobin: 12.6 g/dL (ref 12.0–15.0)
Lymphocytes Relative: 30.4 % (ref 12.0–46.0)
Lymphs Abs: 1.9 10*3/uL (ref 0.7–4.0)
MCHC: 33.6 g/dL (ref 30.0–36.0)
MCV: 83.4 fl (ref 78.0–100.0)
Monocytes Absolute: 0.6 10*3/uL (ref 0.1–1.0)
Monocytes Relative: 9.2 % (ref 3.0–12.0)
Neutro Abs: 3.4 10*3/uL (ref 1.4–7.7)
Neutrophils Relative %: 55.2 % (ref 43.0–77.0)
Platelets: 324 10*3/uL (ref 150.0–400.0)
RBC: 4.49 Mil/uL (ref 3.87–5.11)
RDW: 13.7 % (ref 11.5–15.5)
WBC: 6.2 10*3/uL (ref 4.0–10.5)

## 2021-06-04 LAB — COMPREHENSIVE METABOLIC PANEL
ALT: 30 U/L (ref 0–35)
AST: 32 U/L (ref 0–37)
Albumin: 4.5 g/dL (ref 3.5–5.2)
Alkaline Phosphatase: 58 U/L (ref 39–117)
BUN: 10 mg/dL (ref 6–23)
CO2: 29 mEq/L (ref 19–32)
Calcium: 10.2 mg/dL (ref 8.4–10.5)
Chloride: 96 mEq/L (ref 96–112)
Creatinine, Ser: 0.8 mg/dL (ref 0.40–1.20)
GFR: 70.22 mL/min (ref >60.00)
Glucose, Bld: 89 mg/dL (ref 70–99)
Potassium: 3.6 mEq/L (ref 3.5–5.1)
Sodium: 134 mEq/L — ABNORMAL LOW (ref 135–145)
Total Bilirubin: 0.5 mg/dL (ref 0.2–1.2)
Total Protein: 7.4 g/dL (ref 6.0–8.3)

## 2021-06-04 LAB — MICROALBUMIN / CREATININE URINE RATIO
Creatinine,U: 40.1 mg/dL
Microalb Creat Ratio: 2.2 mg/g (ref 0.0–30.0)
Microalb, Ur: 0.9 mg/dL (ref 0.0–1.9)

## 2021-06-04 LAB — TSH: TSH: 5.41 u[IU]/mL (ref 0.35–5.50)

## 2021-06-04 LAB — HEMOGLOBIN A1C: Hgb A1c MFr Bld: 6 % (ref 4.6–6.5)

## 2021-06-04 LAB — T4, FREE: Free T4: 1.1 ng/dL (ref 0.60–1.60)

## 2021-06-11 ENCOUNTER — Ambulatory Visit (INDEPENDENT_AMBULATORY_CARE_PROVIDER_SITE_OTHER): Payer: Medicare Other | Admitting: Family Medicine

## 2021-06-11 ENCOUNTER — Encounter: Payer: Self-pay | Admitting: Family Medicine

## 2021-06-11 VITALS — BP 114/68 | HR 53 | Temp 97.7°F | Ht 60.0 in | Wt 103.5 lb

## 2021-06-11 DIAGNOSIS — E785 Hyperlipidemia, unspecified: Secondary | ICD-10-CM | POA: Diagnosis not present

## 2021-06-11 DIAGNOSIS — I10 Essential (primary) hypertension: Secondary | ICD-10-CM

## 2021-06-11 DIAGNOSIS — E2839 Other primary ovarian failure: Secondary | ICD-10-CM

## 2021-06-11 DIAGNOSIS — E119 Type 2 diabetes mellitus without complications: Secondary | ICD-10-CM

## 2021-06-11 DIAGNOSIS — Z0000 Encounter for general adult medical examination without abnormal findings: Secondary | ICD-10-CM | POA: Diagnosis not present

## 2021-06-11 DIAGNOSIS — R634 Abnormal weight loss: Secondary | ICD-10-CM

## 2021-06-11 DIAGNOSIS — R7989 Other specified abnormal findings of blood chemistry: Secondary | ICD-10-CM

## 2021-06-11 DIAGNOSIS — E1169 Type 2 diabetes mellitus with other specified complication: Secondary | ICD-10-CM

## 2021-06-11 MED ORDER — AMLODIPINE BESYLATE 5 MG PO TABS
5.0000 mg | ORAL_TABLET | Freq: Every day | ORAL | 3 refills | Status: DC
Start: 2021-06-11 — End: 2022-08-28

## 2021-06-11 MED ORDER — LISINOPRIL 40 MG PO TABS: 40.0000 mg | ORAL_TABLET | Freq: Every day | ORAL | 3 refills | Status: DC

## 2021-06-11 MED ORDER — METFORMIN HCL ER 500 MG PO TB24: ORAL_TABLET | ORAL | 3 refills | Status: DC

## 2021-06-11 MED ORDER — ATORVASTATIN CALCIUM 10 MG PO TABS
10.0000 mg | ORAL_TABLET | Freq: Every day | ORAL | 3 refills | Status: DC
Start: 2021-06-11 — End: 2022-09-02

## 2021-06-11 MED ORDER — HYDROCHLOROTHIAZIDE 25 MG PO TABS: ORAL_TABLET | ORAL | 3 refills | Status: DC

## 2021-06-11 MED ORDER — POTASSIUM CHLORIDE CRYS ER 20 MEQ PO TBCR: 20.0000 meq | EXTENDED_RELEASE_TABLET | Freq: Every day | ORAL | 3 refills | Status: DC

## 2021-06-11 NOTE — Assessment & Plan Note (Signed)
Pt had covid in dec and then had to care for a newborn baby for over a month in march  ?Was many times to busy to take time to eat ?Very active and also works outdoors and walks  ? ?Encouraged strongly to eat more/more protein and talk to family about taking some load off of her (is getting better now)  ?F/u planned in 1-2 mo to check weight and eval further  ?

## 2021-06-11 NOTE — Assessment & Plan Note (Signed)
Reviewed health habits including diet and exercise and skin cancer prevention ?Reviewed appropriate screening tests for age  ?Also reviewed health mt list, fam hx and immunization status , as well as social and family history   ?See HPI ?Labs reviewed  ?cologaurd nl 07/2019 ?Mammogram due in June/has this scheduled ?Ref done for dexa    No falls or fx, taking vit D ?Vaccines reviewed  ?Given materials to work on adv directive  ?No cognitive concerns ?Hearing screen is nl  ?utd vision /eye care ?phq score of 0 ?No help with ADLS, nl functionality  ? ?

## 2021-06-11 NOTE — Assessment & Plan Note (Signed)
Lab Results  ?Component Value Date  ? HGBA1C 6.0 06/04/2021  ? ?Wt loss noted ?Will cut metformin xr to 500 mg daily  ?Encouraged more protein  ?Eye exam utd ?On ace and statin  ?Good foot care ?

## 2021-06-11 NOTE — Assessment & Plan Note (Signed)
bp in fair control at this time  ?BP Readings from Last 1 Encounters:  ?06/11/21 114/68  ? ?No changes needed ?Most recent labs reviewed  ?Disc lifstyle change with low sodium diet and exercise  ?Plan to continue  ?Amlodipine 5 mg daily  ?Hctz 25 mg daily ?Lisinopril 40 mg daily ?

## 2021-06-11 NOTE — Progress Notes (Signed)
? ?Subjective:  ? ? Patient ID: [name], female    DOB: April 20, 1940, 81 y.o.   MRN: [id number] ? ?HPI ?Pt presents for amw and health mt visit  ? ?I have personally reviewed the Medicare Annual Wellness questionnaire and have noted ?1. The patient's medical and social history ?2. Their use of alcohol, tobacco or illicit drugs ?3. Their current medications and supplements ?4. The patient's functional ability including ADL's, fall risks, home safety risks and hearing or visual ?            impairment. ?5. Diet and physical activities ?6. Evidence for depression or mood disorders ? ?The patients weight, height, BMI have been recorded in the chart and visual acuity is per eye clinic.  ?I have made referrals, counseling and provided education to the patient based review of the above and I have provided the pt with a written personalized care plan for preventive services. ?Reviewed and updated provider list, see scanned forms. ? ?See scanned forms.  Routine anticipatory guidance given to patient.  See health maintenance. ?Colon cancer screening 2011 colonoscopy but prep was incomplete  ?Cologuard neg 07/2019 ?Breast cancer screening  mammogram 07/2020 ?Self breast exam: no lumps  ?Flu vaccine 11/2020 ?Tetanus vaccine 06/2013 Tdap ?Pneumovax up to date  ?Zoster vaccine: cannot have  ?Dexa 04/2015 normal BMD  ?Falls: none  ?Fractures:none ?Supplements: takes D3 1000 iu  ?Exercise : works outside and walking  ?Also very active  ? ? ?Advance directive:  given materials to work on  ?Cognitive function addressed- see scanned forms- and if abnormal then additional documentation follows.  ? ?No changes  ?She misplaces things occ ?Very active, handles her own affairs  ?No concerns  ?Is social/ with family , gets out and does things  ? ? ?PMH and SH reviewed ? ?Meds, vitals, and allergies reviewed.  ? ?ROS: See HPI.  Otherwise negative.   ? ?Weight : ?Wt Readings from Last 3 Encounters:  ?06/11/21 103 lb 8 oz (46.9 kg)  ?07/10/20  109 lb 5 oz (49.6 kg)  ?07/02/20 111 lb (50.3 kg)  ? ?20.21 kg/m? ? ?Has been loosing weight  ?She did have covid in December  ?Think it could be from metformin  ?Appetite is good  ? ?Does not eat as much as she did a year ago  ?She sometimes forgets to eat - gets busy (no memory problems) ?Had to keep a newborn grandchild when mom was sick  ?Keeps him on and off  ?Cannot eat when she takes care of him  ? ? ? ?Hearing/vision: ?Hearing Screening  ? $'500Hz'B$'1000Hz'$'2000Hz'$'4000Hz'$   ?Right ear 40 40 40 40  ?Left ear 40 40 40 40  ?Vision Screening - Comments:: Eye exam on 02/28/21 at [facility]. Dr. Raul Del ? ? ?PHQ: ? ?  06/11/2021  ?  9:04 AM 07/10/2020  ? 10:34 AM 07/02/2020  ?  2:22 PM 04/07/2019  ?  2:45 PM 06/24/2018  ? 10:18 AM  ?Depression screen PHQ 2/9  ?Decreased Interest 0 0 0  1 0  ?Down, Depressed, Hopeless 0 0 0 0 0  ?PHQ - 2 Score 0 0 0 1 0  ?Altered sleeping   1 0 0  ?Tired, decreased energy   0 0 0  ?Change in appetite   0 1 0  ?Feeling bad or failure about yourself    0 0 0  ?Trouble concentrating   0 0 0  ?Moving slowly or fidgety/restless   0 0  0  ?Suicidal thoughts   0 0 0  ?PHQ-9 Score   1 2 0  ?Difficult doing work/chores   Not difficult at all Not difficult at all Not difficult at all  ?  ? Significant value  ? ? ? ?ADLs: no help needed  ? ?Functionality: excellent  ? ?Care team  ?Hurley Blevins ?[facility]  ? ?HTN ?bp is stable today  ?No cp or palpitations or headaches or edema  ?No side effects to medicines  ?BP Readings from Last 3 Encounters:  ?06/11/21 114/68  ?01/17/21 135/66  ?11/11/20 123/69  ?   ? ?Amlodipine 5 mg daily  ?Hctz 25 mg daily ?Lisinopril 40 mg daily ? ?Lab Results  ?Component Value Date  ? CREATININE 0.80 06/04/2021  ? BUN 10 06/04/2021  ? NA 134 (L) 06/04/2021  ? K 3.6 06/04/2021  ? CL 96 06/04/2021  ? CO2 29 06/04/2021  ? ? ? ?DM2 ?Lab Results  ?Component Value Date  ? HGBA1C 6.0 06/04/2021  ? ?This is stable ?Metformin xr 1000 mg daily  ?Ace and statin  ?Eye exam 02/2021 ?Good  foot care ? ?Glucose levels are good  ? ?Eats chicken and fish and lean meat  ?No dairy  ?Lots of eggs and nuts  ? ? ?Hyperlipidemia ?Lab Results  ?Component Value Date  ? CHOL 154 06/04/2021  ? CHOL 171 06/26/2020  ? CHOL 182 06/27/2019  ? ?Lab Results  ?Component Value Date  ? HDL 85.20 06/04/2021  ? HDL 92.30 06/26/2020  ? HDL 75.30 06/27/2019  ? ?Lab Results  ?Component Value Date  ? [place] 57 06/04/2021  ? [place] 67 06/26/2020  ? [place] 87 06/27/2019  ? ?Lab Results  ?Component Value Date  ? TRIG 60.0 06/04/2021  ? TRIG 58.0 06/26/2020  ? TRIG 97.0 06/27/2019  ? ?Lab Results  ?Component Value Date  ? CHOLHDL 2 06/04/2021  ? CHOLHDL 2 06/26/2020  ? CHOLHDL 2 06/27/2019  ? ?Lab Results  ?Component Value Date  ? LDLDIRECT 111.2 02/28/2013  ? LDLDIRECT 122.4 02/23/2012  ? LDLDIRECT 110.3 08/24/2011  ? ?Atorvastatin 10 mg daily  ? ?H/o elevated TSH ?Lab Results  ?Component Value Date  ? TSH 5.41 06/04/2021  ? ?Lab Results  ?Component Value Date  ? WBC 6.2 06/04/2021  ? HGB 12.6 06/04/2021  ? HCT 37.5 06/04/2021  ? MCV 83.4 06/04/2021  ? PLT 324.0 06/04/2021  ? ?Patient Active Problem List  ? Diagnosis Date Noted  ? Weight loss 07/10/2020  ? History of cholecystectomy 03/06/2019  ? History of pancreatitis 02/21/2019  ? Elevated TSH 06/28/2018  ? Routine general medical examination at a health care facility 04/05/2015  ? Estrogen deficiency 04/05/2015  ? Hypokalemia 09/04/2013  ? Nonspecific abnormal electrocardiogram (ECG) (EKG) 08/30/2013  ? Encounter for Medicare annual wellness exam 03/07/2013  ? Fullness of supraclavicular fossa 02/27/2011  ? Other screening mammogram 02/25/2011  ? Gynecological examination 02/25/2011  ? Cervical radiculopathy 03/31/2010  ? DM type 2 (diabetes mellitus, type 2) ([place]) 07/06/2007  ? FIBROIDS, UTERUS 03/23/2007  ? Hyperlipidemia associated with type 2 diabetes mellitus ([place]) 03/23/2007  ? Essential hypertension 03/23/2007  ? POSTMENOPAUSAL STATUS 03/23/2007  ? ?Past Medical  History:  ?Diagnosis Date  ? Acute pancreatitis 02/22/2019  ? Diabetes mellitus 05/09  ? type II  ? Fibroids   ? Gallstones   ? incidental asymptomatic gallstones  ? Heart murmur   ? Hyperlipidemia   ? Hypertension   ? ?Past Surgical History:  ?Procedure Laterality Date  ?  ANTERIOR CERVICAL DECOMPRESSION/DISCECTOMY FUSION 4 LEVELS N/A 09/21/2019  ? Procedure: ANTERIOR CERVICAL DECOMPRESSION FUSION CERVICAL THREE-FOUR, CERVICAL FOUR-FIVE, CERVICAL FIVE-SIX WITH INSTRUMENTATION AND ALLOGRAFT;  Surgeon: Phylliss Bob, MD;  Location: [place];  Service: Orthopedics;  Laterality: N/A;  ? CHOLECYSTECTOMY N/A 02/24/2019  ? Procedure: LAPAROSCOPIC CHOLECYSTECTOMY;  Surgeon: Ileana Roup, MD;  Location: [place];  Service: General;  Laterality: N/A;  ? NO PAST SURGERIES    ? ?Social History  ? ?Tobacco Use  ? Smoking status: Never  ? Smokeless tobacco: Never  ?Vaping Use  ? Vaping Use: Never used  ?Substance Use Topics  ? Alcohol use: No  ?  Alcohol/week: 0.0 standard drinks  ? Drug use: No  ? ?Family History  ?Problem Relation Age of Onset  ? Hypertension Mother   ? Diabetes Mother   ? Osteoporosis Sister   ? Diabetes Brother   ? ?Allergies  ?Allergen Reactions  ? Ibuprofen Palpitations  ? Sulfamethoxazole-Trimethoprim Rash  ? ?Current Outpatient Medications on File Prior to Visit  ?Medication Sig Dispense Refill  ? Accu-Chek FastClix Lancets MISC TO CHECK GLUCOSE DAILY AND AS NEEDED FOR DIABETES TYPE 2 (BRAND PER PT) 100 each 1  ? benzonatate (TESSALON) 100 MG capsule Take 1 capsule (100 mg total) by mouth 3 (three) times daily as needed for cough. 21 capsule 0  ? Blood Glucose Monitoring Suppl (ACCU-CHEK AVIVA PLUS) w/Device KIT Use to check glucose once daily for DM 2 (Dx. E11.9) 1 kit 0  ? cholecalciferol (VITAMIN D) 1000 UNITS tablet Take 1,000 Units by mouth daily.    ? Emollient (EUCERIN) lotion Apply 1 Bottle topically daily as needed for dry skin.    ? glucose blood (ACCU-CHEK AVIVA PLUS) test strip USE TO CHECK  BLOOD SUGAR ONCE DAILY (DX. E11.9) 50 strip 1  ? Multiple Vitamin (MULTIVITAMIN) tablet Take 1 tablet by mouth daily.    ? Polyethyl Glycol-Propyl Glycol (SYSTANE FREE OP) Place 2 drops into both eyes daily.

## 2021-06-11 NOTE — Assessment & Plan Note (Signed)
Lab Results  ?Component Value Date  ? TSH 5.41 06/04/2021  ? ?Normal now  ?Continue to follow  ?

## 2021-06-11 NOTE — Assessment & Plan Note (Signed)
Disc goals for lipids and reasons to control them ?Rev last labs with pt ?Rev low sat fat diet in detail ?Plan to continue atorvastatin 10 mg daily  ?LDL is well controlled  ?

## 2021-06-11 NOTE — Patient Instructions (Addendum)
Get your mammogram as planned in June  ? ?Update your advance directive and get it notarized  ? ?Cut your metformin xr to one pill daily  ? ?Follow up in 1-2 months to check on your weight  ?Eat 3 meals daily with snacks and plenty of protein  ?Don't forget to eat when you get busy  ? ? ?Get the bivalent booster for covid when you can at the pharmacy  ? ?Schedule your bone density appt at [facility] (call next week) ? ?Please call the location of your choice from the menu below to schedule your Mammogram and/or Bone Density appointment.   ? ?[place]  ? ?[facility]                ?      Phone:  [phone] ?[address]                               ?[place], [place] [address]                                                             ?Services: Traditional and 3D Mammogram, Bone Density  ? ?[facility]           ?      Phone: [phone] ?[address]                                                       ?[place], [place] [address]    ?Service: Bone Density ONLY  ? *this site does NOT perform mammograms ? ?[facility]                       ? Phone:  [phone] ?[address]                                  ?[place], [place] [address]                                            ?Services:  3D Mammogram and Bone Density  ? ? ?[place] ? ?[place] [facility]   ?Phone:  [phone]   ?[place], [place] [address]                                            ?  Services: 3D Mammogram and Bone Density ? ?[place] [facility])  ?Phone:  [phone]   ?[address]                        ?[place], [place] [address]                                              ?Services:  3D Mammogram and Bone Density ? ? ? ? ? ? ?

## 2021-06-13 ENCOUNTER — Other Ambulatory Visit: Payer: Self-pay | Admitting: Family Medicine

## 2021-06-24 ENCOUNTER — Ambulatory Visit (INDEPENDENT_AMBULATORY_CARE_PROVIDER_SITE_OTHER)
Admission: RE | Admit: 2021-06-24 | Discharge: 2021-06-24 | Disposition: A | Discharge status: Home / Self Care | Payer: Medicare Other | Source: Ambulatory Visit | Attending: Family Medicine | Admitting: Family Medicine

## 2021-06-24 DIAGNOSIS — E2839 Other primary ovarian failure: Secondary | ICD-10-CM

## 2021-07-04 ENCOUNTER — Other Ambulatory Visit: Payer: Medicare Other

## 2021-07-11 ENCOUNTER — Encounter: Payer: Medicare Other | Admitting: Family Medicine

## 2021-07-15 ENCOUNTER — Encounter: Payer: Self-pay | Admitting: Family Medicine

## 2021-07-15 ENCOUNTER — Ambulatory Visit (INDEPENDENT_AMBULATORY_CARE_PROVIDER_SITE_OTHER): Payer: Medicare Other | Admitting: Family Medicine

## 2021-07-15 VITALS — BP 118/70 | HR 69 | Temp 97.7°F | Ht 60.0 in | Wt 112.2 lb

## 2021-07-15 DIAGNOSIS — E119 Type 2 diabetes mellitus without complications: Secondary | ICD-10-CM

## 2021-07-15 DIAGNOSIS — R634 Abnormal weight loss: Secondary | ICD-10-CM

## 2021-07-15 NOTE — Patient Instructions (Addendum)
Keep eating regularly with protein  Don't skip meals  Weight is better!  Keep up the good work   Stop at check out to schedule labs late summer

## 2021-07-15 NOTE — Assessment & Plan Note (Signed)
Improved now with better oral intake  Feels better also  Had been unable to eat due to newborn care -not has time to prep meals  Also decreased her dose of metformin   Wt is back up 9 lb  Will continue to follow Recommend continue regular meals with protein

## 2021-07-15 NOTE — Assessment & Plan Note (Signed)
Per pt doing well with less metformin xr (taking 500 mg daily)  No hyper or hyperglycemia Lab Results  Component Value Date   HGBA1C 6.0 06/04/2021    Wt is up now with better protein intake as well Plan to re check a1c in late summer

## 2021-07-15 NOTE — Progress Notes (Signed)
Subjective:    Patient ID: [name], female    DOB: [birth year], 81 y.o.   MRN: [id number]  HPI Pt presents for f/u of weight loss  Wt Readings from Last 3 Encounters:  07/15/21 112 lb 4 oz (50.9 kg)  06/11/21 103 lb 8 oz (46.9 kg)  07/10/20 109 lb 5 oz (49.6 kg)   21.92 kg/m  Did gain some wt since last visit   Able to eat more  More time to eat now   More veg and fruit  Meats - pork chop and roast chicken  Also pb and dairy products /almond milk  Some raisin bran    Last visit discussed wt loss  Had covid in December   Lost from 124 lb to 111 lb from 10/2019 to 06/2020 Then down to 103 lb in April 2023  Now up to 112   Noted she does not eat as much as a year prior  Gets busy and forgets to eat  Had to keep a newborn grandchild for the mother who was sick (could not take time to eat when doing that)   DM2 Lab Results  Component Value Date   HGBA1C 6.0 06/04/2021   We cut her metformin xr to 500 mg daily  Enc more protein  Feeling ok   No high or low blood sugars  No lower than 87 , feels fine   Feels better also   Patient Active Problem List   Diagnosis Date Noted   Weight loss 07/10/2020   History of cholecystectomy 03/06/2019   History of pancreatitis 02/21/2019   Elevated TSH 06/28/2018   Routine general medical examination at a health care facility 04/05/2015   Estrogen deficiency 04/05/2015   Hypokalemia 09/04/2013   Nonspecific abnormal electrocardiogram (ECG) (EKG) 08/30/2013   Encounter for Medicare annual wellness exam 03/07/2013   Fullness of supraclavicular fossa 02/27/2011   Other screening mammogram 02/25/2011   Gynecological examination 02/25/2011   Cervical radiculopathy 03/31/2010   DM type 2 (diabetes mellitus, type 2) ([place]) 07/06/2007   FIBROIDS, UTERUS 03/23/2007   Hyperlipidemia associated with type 2 diabetes mellitus ([place]) 03/23/2007   Essential hypertension 03/23/2007   POSTMENOPAUSAL STATUS 03/23/2007   Past  Medical History:  Diagnosis Date   Acute pancreatitis 02/22/2019   Diabetes mellitus 05/09   type II   Fibroids    Gallstones    incidental asymptomatic gallstones   Heart murmur    Hyperlipidemia    Hypertension    Past Surgical History:  Procedure Laterality Date   ANTERIOR CERVICAL DECOMPRESSION/DISCECTOMY FUSION 4 LEVELS N/A 09/21/2019   Procedure: ANTERIOR CERVICAL DECOMPRESSION FUSION CERVICAL THREE-FOUR, CERVICAL FOUR-FIVE, CERVICAL FIVE-SIX WITH INSTRUMENTATION AND ALLOGRAFT;  Surgeon: Phylliss Bob, MD;  Location: [place];  Service: Orthopedics;  Laterality: N/A;   CHOLECYSTECTOMY N/A 02/24/2019   Procedure: LAPAROSCOPIC CHOLECYSTECTOMY;  Surgeon: Ileana Roup, MD;  Location: [facility];  Service: General;  Laterality: N/A;   NO PAST SURGERIES     Social History   Tobacco Use   Smoking status: Never   Smokeless tobacco: Never  Vaping Use   Vaping Use: Never used  Substance Use Topics   Alcohol use: No    Alcohol/week: 0.0 standard drinks   Drug use: No   Family History  Problem Relation Age of Onset   Hypertension Mother    Diabetes Mother    Osteoporosis Sister    Diabetes Brother    Allergies  Allergen Reactions   Ibuprofen Palpitations   Sulfamethoxazole-Trimethoprim  Rash   Current Outpatient Medications on File Prior to Visit  Medication Sig Dispense Refill   Accu-Chek FastClix Lancets MISC TO CHECK GLUCOSE DAILY AND AS NEEDED FOR DIABETES TYPE 2 (BRAND PER PT) 100 each 1   amLODipine (NORVASC) 5 MG tablet Take 1 tablet (5 mg total) by mouth daily. 90 tablet 3   atorvastatin (LIPITOR) 10 MG tablet Take 1 tablet (10 mg total) by mouth daily. In evening 90 tablet 3   benzonatate (TESSALON) 100 MG capsule Take 1 capsule (100 mg total) by mouth 3 (three) times daily as needed for cough. 21 capsule 0   Blood Glucose Monitoring Suppl (ACCU-CHEK AVIVA PLUS) w/Device KIT Use to check glucose once daily for DM 2 (Dx. E11.9) 1 kit 0   cholecalciferol (VITAMIN D)  1000 UNITS tablet Take 1,000 Units by mouth daily.     Emollient (EUCERIN) lotion Apply 1 Bottle topically daily as needed for dry skin.     glucose blood (ACCU-CHEK AVIVA PLUS) test strip USE TO CHECK BLOOD SUGAR ONCE DAILY (DX. E11.9) 50 strip 1   hydrochlorothiazide (HYDRODIURIL) 25 MG tablet TAKE 1 TABLET BY MOUTH EVERY DAY IN THE MORNING 90 tablet 3   lisinopril (ZESTRIL) 40 MG tablet Take 1 tablet (40 mg total) by mouth daily. 90 tablet 3   metFORMIN (GLUCOPHAGE-XR) 500 MG 24 hr tablet TAKE 1 TAB  BY MOUTH EVERY DAY WITH BREAKFAST 90 tablet 3   Multiple Vitamin (MULTIVITAMIN) tablet Take 1 tablet by mouth daily.     Polyethyl Glycol-Propyl Glycol (SYSTANE FREE OP) Place 2 drops into both eyes daily.     potassium chloride SA (KLOR-CON M20) 20 MEQ tablet Take 1 tablet (20 mEq total) by mouth daily. 90 tablet 3   No current facility-administered medications on file prior to visit.     Review of Systems  Constitutional:  Negative for activity change, appetite change, fatigue, fever and unexpected weight change.       Energy level is better   HENT:  Negative for congestion, ear pain, rhinorrhea, sinus pressure and sore throat.   Eyes:  Negative for pain, redness and visual disturbance.  Respiratory:  Negative for cough, shortness of breath and wheezing.   Cardiovascular:  Negative for chest pain and palpitations.  Gastrointestinal:  Negative for abdominal pain, blood in stool, constipation and diarrhea.  Endocrine: Negative for polydipsia and polyuria.  Genitourinary:  Negative for dysuria, frequency and urgency.  Musculoskeletal:  Negative for arthralgias, back pain and myalgias.  Skin:  Negative for pallor and rash.  Allergic/Immunologic: Negative for environmental allergies.  Neurological:  Negative for dizziness, syncope and headaches.  Hematological:  Negative for adenopathy. Does not bruise/bleed easily.  Psychiatric/Behavioral:  Negative for decreased concentration and dysphoric  mood. The patient is not nervous/anxious.       Objective:   Physical Exam Constitutional:      General: She is not in acute distress.    Appearance: Normal appearance. She is well-developed and normal weight. She is not ill-appearing or diaphoretic.     Comments: Improved weight   HENT:     Head: Normocephalic and atraumatic.  Eyes:     Conjunctiva/sclera: Conjunctivae normal.     Pupils: Pupils are equal, round, and reactive to light.  Neck:     Thyroid: No thyromegaly.     Vascular: No carotid bruit or JVD.  Cardiovascular:     Rate and Rhythm: Normal rate and regular rhythm.     Heart sounds: Normal heart  sounds.    No gallop.  Pulmonary:     Effort: Pulmonary effort is normal. No respiratory distress.     Breath sounds: Normal breath sounds. No wheezing or rales.  Abdominal:     General: There is no distension or abdominal bruit.     Palpations: Abdomen is soft.  Musculoskeletal:     Cervical back: Normal range of motion and neck supple.     Right lower leg: No edema.     Left lower leg: No edema.  Lymphadenopathy:     Cervical: No cervical adenopathy.  Skin:    General: Skin is warm and dry.     Coloration: Skin is not pale.     Findings: No rash.  Neurological:     Mental Status: She is alert.     Coordination: Coordination normal.     Deep Tendon Reflexes: Reflexes are normal and symmetric. Reflexes normal.  Psychiatric:        Mood and Affect: Mood normal.          Assessment & Plan:   Problem List Items Addressed This Visit       Endocrine   DM type 2 (diabetes mellitus, type 2) ([place])    Per pt doing well with less metformin xr (taking 500 mg daily)  No hyper or hyperglycemia Lab Results  Component Value Date   HGBA1C 6.0 06/04/2021    Wt is up now with better protein intake as well Plan to re check a1c in late summer         Other   Weight loss - Primary    Improved now with better oral intake  Feels better also  Had been unable to  eat due to newborn care -not has time to prep meals  Also decreased her dose of metformin   Wt is back up 9 lb  Will continue to follow Recommend continue regular meals with protein

## 2021-08-04 ENCOUNTER — Encounter: Payer: Self-pay | Admitting: Family Medicine

## 2021-08-04 DIAGNOSIS — Z1231 Encounter for screening mammogram for malignant neoplasm of breast: Secondary | ICD-10-CM | POA: Diagnosis not present

## 2021-08-23 ENCOUNTER — Other Ambulatory Visit: Payer: Self-pay | Admitting: Family Medicine

## 2021-09-15 ENCOUNTER — Telehealth: Payer: Self-pay | Admitting: Family Medicine

## 2021-09-15 DIAGNOSIS — E119 Type 2 diabetes mellitus without complications: Secondary | ICD-10-CM

## 2021-09-15 DIAGNOSIS — I10 Essential (primary) hypertension: Secondary | ICD-10-CM

## 2021-09-15 DIAGNOSIS — R7989 Other specified abnormal findings of blood chemistry: Secondary | ICD-10-CM

## 2021-09-15 NOTE — Telephone Encounter (Signed)
-----   Message from Ellamae Sia sent at 09/01/2021  2:23 PM EDT ----- Regarding: Lab orders for Tuesday, 8.1.23 Lab orders, no f/u

## 2021-09-16 ENCOUNTER — Other Ambulatory Visit (INDEPENDENT_AMBULATORY_CARE_PROVIDER_SITE_OTHER): Payer: Medicare Other

## 2021-09-16 DIAGNOSIS — R7989 Other specified abnormal findings of blood chemistry: Secondary | ICD-10-CM

## 2021-09-16 DIAGNOSIS — E119 Type 2 diabetes mellitus without complications: Secondary | ICD-10-CM

## 2021-09-16 DIAGNOSIS — I10 Essential (primary) hypertension: Secondary | ICD-10-CM

## 2021-09-16 LAB — BASIC METABOLIC PANEL
BUN: 11 mg/dL (ref 6–23)
CO2: 29 mEq/L (ref 19–32)
Calcium: 10.3 mg/dL (ref 8.4–10.5)
Chloride: 98 mEq/L (ref 96–112)
Creatinine, Ser: 0.89 mg/dL (ref 0.40–1.20)
GFR: 61.66 mL/min (ref >60.00)
Glucose, Bld: 94 mg/dL (ref 70–99)
Potassium: 3.8 mEq/L (ref 3.5–5.1)
Sodium: 137 mEq/L (ref 135–145)

## 2021-09-16 LAB — TSH: TSH: 3.67 u[IU]/mL (ref 0.35–5.50)

## 2021-09-16 LAB — HEMOGLOBIN A1C: Hgb A1c MFr Bld: 6.3 % (ref 4.6–6.5)

## 2021-12-03 ENCOUNTER — Other Ambulatory Visit: Payer: Self-pay | Admitting: Family Medicine

## 2022-01-06 ENCOUNTER — Ambulatory Visit (INDEPENDENT_AMBULATORY_CARE_PROVIDER_SITE_OTHER): Payer: Medicare Other | Admitting: Internal Medicine

## 2022-01-06 ENCOUNTER — Encounter: Payer: Self-pay | Admitting: Internal Medicine

## 2022-01-06 VITALS — BP 130/76 | HR 72 | Temp 97.9°F | Ht 60.0 in | Wt 119.0 lb

## 2022-01-06 DIAGNOSIS — R200 Anesthesia of skin: Secondary | ICD-10-CM | POA: Diagnosis not present

## 2022-01-06 NOTE — Assessment & Plan Note (Signed)
Had been doing a lot of canning The numbness is only in the morning and then wears off No weakness Nothing to suggest cervical radiculopathy Seems likely to be (hopefully) transient carpal tunnel Discussed trying a splint at night

## 2022-01-06 NOTE — Patient Instructions (Signed)

## 2022-01-06 NOTE — Progress Notes (Signed)
Subjective:    Patient ID: [name], female    DOB: 23-May-1940, 81 y.o.   MRN: [id number]  HPI Here due to left hand and arm numbness  Started about a week ago Woke up with it one day Comes and goes This morning it was "scary"--had some pain up to neck  Will awaken her at times Not much during the day No change in pillow or sleep position No hand weakness  Has been doing a lot of canning lately No symptoms while doing this  Current Outpatient Medications on File Prior to Visit  Medication Sig Dispense Refill   Accu-Chek FastClix Lancets MISC TO CHECK GLUCOSE DAILY AND AS NEEDED FOR DIABETES TYPE 2 (BRAND PER PT) 100 each 1   amLODipine (NORVASC) 5 MG tablet Take 1 tablet (5 mg total) by mouth daily. 90 tablet 3   atorvastatin (LIPITOR) 10 MG tablet Take 1 tablet (10 mg total) by mouth daily. In evening 90 tablet 3   Blood Glucose Monitoring Suppl (ACCU-CHEK AVIVA PLUS) w/Device KIT Use to check glucose once daily for DM 2 (Dx. E11.9) 1 kit 0   cholecalciferol (VITAMIN D) 1000 UNITS tablet Take 1,000 Units by mouth daily.     Emollient (EUCERIN) lotion Apply 1 Bottle topically daily as needed for dry skin.     glucose blood (ACCU-CHEK AVIVA PLUS) test strip USE TO CHECK BLOOD SUGAR ONCE DAILY (DX. E11.9) 50 strip 2   hydrochlorothiazide (HYDRODIURIL) 25 MG tablet TAKE 1 TABLET BY MOUTH EVERY DAY IN THE MORNING 90 tablet 3   lisinopril (ZESTRIL) 40 MG tablet Take 1 tablet (40 mg total) by mouth daily. 90 tablet 3   metFORMIN (GLUCOPHAGE-XR) 500 MG 24 hr tablet TAKE 1 TAB  BY MOUTH EVERY DAY WITH BREAKFAST 90 tablet 3   Multiple Vitamin (MULTIVITAMIN) tablet Take 1 tablet by mouth daily.     Polyethyl Glycol-Propyl Glycol (SYSTANE FREE OP) Place 2 drops into both eyes daily.     potassium chloride SA (KLOR-CON M20) 20 MEQ tablet Take 1 tablet (20 mEq total) by mouth daily. 90 tablet 3   No current facility-administered medications on file prior to visit.    Allergies   Allergen Reactions   Ibuprofen Palpitations   Sulfamethoxazole-Trimethoprim Rash    Past Medical History:  Diagnosis Date   Acute pancreatitis 02/22/2019   Diabetes mellitus 05/09   type II   Fibroids    Gallstones    incidental asymptomatic gallstones   Heart murmur    Hyperlipidemia    Hypertension     Past Surgical History:  Procedure Laterality Date   ANTERIOR CERVICAL DECOMPRESSION/DISCECTOMY FUSION 4 LEVELS N/A 09/21/2019   Procedure: ANTERIOR CERVICAL DECOMPRESSION FUSION CERVICAL THREE-FOUR, CERVICAL FOUR-FIVE, CERVICAL FIVE-SIX WITH INSTRUMENTATION AND ALLOGRAFT;  Surgeon: Phylliss Bob, MD;  Location: [place];  Service: Orthopedics;  Laterality: N/A;   CHOLECYSTECTOMY N/A 02/24/2019   Procedure: LAPAROSCOPIC CHOLECYSTECTOMY;  Surgeon: Ileana Roup, MD;  Location: [place];  Service: General;  Laterality: N/A;   NO PAST SURGERIES      Family History  Problem Relation Age of Onset   Hypertension Mother    Diabetes Mother    Osteoporosis Sister    Diabetes Brother     Social History   Socioeconomic History   Marital status: Widowed    Spouse name: Not on file   Number of children: Not on file   Years of education: Not on file   Highest education level: Not on file  Occupational History   Not on file  Tobacco Use   Smoking status: Never   Smokeless tobacco: Never  Vaping Use   Vaping Use: Never used  Substance and Sexual Activity   Alcohol use: No    Alcohol/week: 0.0 standard drinks of alcohol   Drug use: No   Sexual activity: Not Currently  Other Topics Concern   Not on file  Social History Narrative   Not on file   Social Determinants of Health   Financial Resource Strain: Low Risk  (03/21/2020)   Overall Financial Resource Strain (CARDIA)    Difficulty of Paying Living Expenses: Not very hard  Food Insecurity: Not on file  Transportation Needs: Not on file  Physical Activity: Not on file  Stress: Not on file  Social Connections: Not on  file  Intimate Partner Violence: Not on file   Review of Systems No fever Not feeling sick     Objective:   Physical Exam Constitutional:      Appearance: Normal appearance.  Musculoskeletal:     Cervical back: Neck supple.     Comments: No hand findings  Lymphadenopathy:     Cervical: No cervical adenopathy.  Neurological:     Mental Status: She is alert.     Comments: Normal strength in arms/hands            Assessment & Plan:

## 2022-03-04 DIAGNOSIS — E119 Type 2 diabetes mellitus without complications: Secondary | ICD-10-CM | POA: Diagnosis not present

## 2022-03-04 DIAGNOSIS — H35373 Puckering of macula, bilateral: Secondary | ICD-10-CM | POA: Diagnosis not present

## 2022-03-04 DIAGNOSIS — H43813 Vitreous degeneration, bilateral: Secondary | ICD-10-CM | POA: Diagnosis not present

## 2022-03-04 DIAGNOSIS — H26491 Other secondary cataract, right eye: Secondary | ICD-10-CM | POA: Diagnosis not present

## 2022-03-04 LAB — HM DIABETES EYE EXAM

## 2022-05-07 ENCOUNTER — Other Ambulatory Visit: Payer: Self-pay | Admitting: Family Medicine

## 2022-05-28 ENCOUNTER — Other Ambulatory Visit: Payer: Self-pay | Admitting: Family Medicine

## 2022-06-07 ENCOUNTER — Telehealth: Payer: Self-pay | Admitting: Family Medicine

## 2022-06-07 DIAGNOSIS — R7989 Other specified abnormal findings of blood chemistry: Secondary | ICD-10-CM

## 2022-06-07 DIAGNOSIS — E119 Type 2 diabetes mellitus without complications: Secondary | ICD-10-CM

## 2022-06-07 DIAGNOSIS — E1169 Type 2 diabetes mellitus with other specified complication: Secondary | ICD-10-CM

## 2022-06-07 DIAGNOSIS — I10 Essential (primary) hypertension: Secondary | ICD-10-CM

## 2022-06-07 NOTE — Telephone Encounter (Signed)
-----   Message from Alvina Chou sent at 05/18/2022 11:34 AM EDT ----- Regarding: Lab orders for Monday, 4.22.24 Patient is scheduled for CPX labs, please order future labs, Thanks , Camelia Eng

## 2022-06-08 ENCOUNTER — Other Ambulatory Visit (INDEPENDENT_AMBULATORY_CARE_PROVIDER_SITE_OTHER): Payer: Medicare Other

## 2022-06-08 DIAGNOSIS — I10 Essential (primary) hypertension: Secondary | ICD-10-CM | POA: Diagnosis not present

## 2022-06-08 DIAGNOSIS — E119 Type 2 diabetes mellitus without complications: Secondary | ICD-10-CM | POA: Diagnosis not present

## 2022-06-08 DIAGNOSIS — E1169 Type 2 diabetes mellitus with other specified complication: Secondary | ICD-10-CM

## 2022-06-08 DIAGNOSIS — R7989 Other specified abnormal findings of blood chemistry: Secondary | ICD-10-CM

## 2022-06-08 DIAGNOSIS — E785 Hyperlipidemia, unspecified: Secondary | ICD-10-CM | POA: Diagnosis not present

## 2022-06-08 LAB — CBC WITH DIFFERENTIAL/PLATELET
Basophils Absolute: 0.1 10*3/uL (ref 0.0–0.1)
Basophils Relative: 0.9 % (ref 0.0–3.0)
Eosinophils Absolute: 0.3 10*3/uL (ref 0.0–0.7)
Eosinophils Relative: 3.7 % (ref 0.0–5.0)
HCT: 37.3 % (ref 36.0–46.0)
Hemoglobin: 12.7 g/dL (ref 12.0–15.0)
Lymphocytes Relative: 32.2 % (ref 12.0–46.0)
Lymphs Abs: 2.3 10*3/uL (ref 0.7–4.0)
MCHC: 34 g/dL (ref 30.0–36.0)
MCV: 84 fl (ref 78.0–100.0)
Monocytes Absolute: 0.7 10*3/uL (ref 0.1–1.0)
Monocytes Relative: 10.5 % (ref 3.0–12.0)
Neutro Abs: 3.7 10*3/uL (ref 1.4–7.7)
Neutrophils Relative %: 52.7 % (ref 43.0–77.0)
Platelets: 314 10*3/uL (ref 150.0–400.0)
RBC: 4.44 Mil/uL (ref 3.87–5.11)
RDW: 14.4 % (ref 11.5–15.5)
WBC: 7 10*3/uL (ref 4.0–10.5)

## 2022-06-08 LAB — HEMOGLOBIN A1C: Hgb A1c MFr Bld: 6.3 % (ref 4.6–6.5)

## 2022-06-08 LAB — LIPID PANEL
Cholesterol: 181 mg/dL (ref 0–200)
HDL: 98.1 mg/dL (ref >39.00)
LDL Cholesterol: 73 mg/dL (ref 0–99)
NonHDL: 82.53
Total CHOL/HDL Ratio: 2
Triglycerides: 50 mg/dL (ref 0.0–149.0)
VLDL: 10 mg/dL (ref 0.0–40.0)

## 2022-06-08 LAB — COMPREHENSIVE METABOLIC PANEL
ALT: 15 U/L (ref 0–35)
AST: 23 U/L (ref 0–37)
Albumin: 4.2 g/dL (ref 3.5–5.2)
Alkaline Phosphatase: 65 U/L (ref 39–117)
BUN: 13 mg/dL (ref 6–23)
CO2: 32 mEq/L (ref 19–32)
Calcium: 10.2 mg/dL (ref 8.4–10.5)
Chloride: 97 mEq/L (ref 96–112)
Creatinine, Ser: 0.98 mg/dL (ref 0.40–1.20)
GFR: 54.65 mL/min — ABNORMAL LOW (ref >60.00)
Glucose, Bld: 88 mg/dL (ref 70–99)
Potassium: 3.8 mEq/L (ref 3.5–5.1)
Sodium: 137 mEq/L (ref 135–145)
Total Bilirubin: 0.5 mg/dL (ref 0.2–1.2)
Total Protein: 7.1 g/dL (ref 6.0–8.3)

## 2022-06-08 LAB — TSH: TSH: 3.05 u[IU]/mL (ref 0.35–5.50)

## 2022-06-08 LAB — MICROALBUMIN / CREATININE URINE RATIO
Creatinine,U: 96.5 mg/dL
Microalb Creat Ratio: 2.3 mg/g (ref 0.0–30.0)
Microalb, Ur: 2.2 mg/dL — ABNORMAL HIGH (ref 0.0–1.9)

## 2022-06-08 LAB — T4, FREE: Free T4: 0.99 ng/dL (ref 0.60–1.60)

## 2022-06-15 ENCOUNTER — Ambulatory Visit (INDEPENDENT_AMBULATORY_CARE_PROVIDER_SITE_OTHER): Payer: Medicare Other | Admitting: Family Medicine

## 2022-06-15 ENCOUNTER — Encounter: Payer: Self-pay | Admitting: Family Medicine

## 2022-06-15 VITALS — BP 120/60 | HR 75 | Temp 97.9°F | Ht 59.5 in | Wt 115.5 lb

## 2022-06-15 DIAGNOSIS — R7989 Other specified abnormal findings of blood chemistry: Secondary | ICD-10-CM | POA: Diagnosis not present

## 2022-06-15 DIAGNOSIS — Z0000 Encounter for general adult medical examination without abnormal findings: Secondary | ICD-10-CM | POA: Diagnosis not present

## 2022-06-15 DIAGNOSIS — E876 Hypokalemia: Secondary | ICD-10-CM | POA: Diagnosis not present

## 2022-06-15 DIAGNOSIS — E119 Type 2 diabetes mellitus without complications: Secondary | ICD-10-CM | POA: Diagnosis not present

## 2022-06-15 DIAGNOSIS — E785 Hyperlipidemia, unspecified: Secondary | ICD-10-CM | POA: Diagnosis not present

## 2022-06-15 DIAGNOSIS — E1169 Type 2 diabetes mellitus with other specified complication: Secondary | ICD-10-CM

## 2022-06-15 DIAGNOSIS — I10 Essential (primary) hypertension: Secondary | ICD-10-CM

## 2022-06-15 NOTE — Assessment & Plan Note (Signed)
Per pt doing well with less metformin xr (taking 500 mg daily)  No hyper or hyperglycemia Lab Results  Component Value Date   HGBA1C 6.3 06/08/2022    Trying to eat more protein to mt weight  Nl microalb ratio  Takes ace and statin  Good eye care Good foot care

## 2022-06-15 NOTE — Assessment & Plan Note (Signed)
Lab Results  Component Value Date   K 3.8 06/08/2022   Continues [place] con 20 meq daily

## 2022-06-15 NOTE — Assessment & Plan Note (Addendum)
bp in fair control at this time  BP Readings from Last 1 Encounters:  06/15/22 (!) 126/58   No changes needed Most recent labs reviewed  Disc lifstyle change with low sodium diet and exercise  Plan to continue  Amlodipine 5 mg daily  Hctz 25 mg daily Lisinopril 40 mg daily  GFR was 54.6  Encouraged good fluid intake Will continue to monitor

## 2022-06-15 NOTE — Patient Instructions (Addendum)
Get your mammogram as planned   Continue the exercise   For kidney health make sure you drink plenty of fluids   Eat a healthy low glycemic diet   No change in medications

## 2022-06-15 NOTE — Progress Notes (Signed)
Subjective:    Patient ID: [name], female    DOB: 09/25/1940, 82 y.o.   MRN: [id number]  HPI Here for health maintenance exam and to review chronic medical problems    Wt Readings from Last 3 Encounters:  06/15/22 115 lb 8 oz (52.4 kg)  01/06/22 119 lb (54 kg)  07/15/21 112 lb 4 oz (50.9 kg)   22.94 kg/m  Feeling ok  Appetite is better than it was   Started a senior exercise program  Walking and [employer] /strength training   Vitals:   06/15/22 0854 06/15/22 1033  BP: (!) 126/58 120/60  Pulse: 75   Temp: 97.9 F (36.6 C)   SpO2: 100%      Immunization History  Administered Date(s) Administered   Fluad Quad(high Dose 65+) 11/14/2018, 11/03/2019, 12/08/2021   Influenza Split 11/29/2010   Influenza Whole 11/04/2011   Influenza, High Dose Seasonal PF 11/19/2016, 12/06/2017, 11/28/2020   Influenza,inj,quad, With Preservative 10/18/2015   Influenza-Unspecified 11/12/2012, 11/16/2013, 11/21/2013, 11/15/2014, 11/01/2015   PFIZER(Purple Top)SARS-COV-2 Vaccination 03/25/2019, 04/19/2019, 12/29/2019   Pfizer Covid-19 Vaccine Bivalent Booster 73yrs & up 07/17/2021   Pneumococcal Conjugate-13 03/21/2014   Pneumococcal Polysaccharide-23 08/14/2008   Td 06/06/2003, 07/11/2013   Tdap 07/11/2013   Zoster, Live 05/23/2014   Health Maintenance Due  Topic Date Due   COVID-19 Vaccine (5 - 2023-24 season) 10/17/2021   Medicare Annual Wellness (AWV)  06/12/2022   FOOT EXAM  06/12/2022   Mammogram 07/2021-has next one scheduled in June at [facility]  Self breast exam: no lumps  Has a scab on r breast   Colon cancer screen- pt is 82 Had neg cologuard test 07/2019   Dexa  06/2021 very mild osteopenia of forearm   Falls - none  Fractures-none  Supplements -tales vitamin D 3 , inc to 2000 iu daily Exercise - is in a program now   Mood  PHQ 1     06/15/2022    9:32 AM 06/11/2021    9:04 AM 07/10/2020   10:34 AM 07/02/2020    2:22 PM 04/07/2019    2:45 PM  Depression screen  PHQ 2/9  Decreased Interest 0 0 0 0  1  Down, Depressed, Hopeless 0 0 0 0 0  PHQ - 2 Score 0 0 0 0 1  Altered sleeping 1   1 0  Tired, decreased energy 0   0 0  Change in appetite 0   0 1  Feeling bad or failure about yourself  0   0 0  Trouble concentrating 0   0 0  Moving slowly or fidgety/restless 0   0 0  Suicidal thoughts 0   0 0  PHQ-9 Score 1   1 2   Difficult doing work/chores Not difficult at all   Not difficult at all Not difficult at all     Significant value      Loves to get out  Is social  Involved in things    HTN bp is stable today  No cp or palpitations or headaches or edema  No side effects to medicines  BP Readings from Last 3 Encounters:  06/15/22 (!) 126/58  01/06/22 130/76  07/15/21 118/70    Amlodipine 5 mg daily  Hctz 25 mg daily Lisinopril 40 mg daily  Last metabolic panel Lab Results  Component Value Date   GLUCOSE 88 06/08/2022   NA 137 06/08/2022   K 3.8 06/08/2022   CL 97 06/08/2022   CO2 32 06/08/2022  BUN 13 06/08/2022   CREATININE 0.98 06/08/2022   GFRNONAA 54 (L) 09/15/2019   CALCIUM 10.2 06/08/2022   PHOS 3.0 03/28/2019   PHOS 3.0 03/28/2019   PROT 7.1 06/08/2022   ALBUMIN 4.2 06/08/2022   BILITOT 0.5 06/08/2022   ALKPHOS 65 06/08/2022   AST 23 06/08/2022   ALT 15 06/08/2022   ANIONGAP 12 09/15/2019   GFRof 54.65 No nsaids    DM2 Lab Results  Component Value Date   HGBA1C 6.3 06/08/2022   Up from 6.0 Metformin xr 500 mg daily -tolerates it   Lab Results  Component Value Date   MICROALBUR 2.2 (H) 06/08/2022   MICROALBUR 0.9 06/04/2021     H/o elevated TSH  Lab Results  Component Value Date   TSH 3.05 06/08/2022    FT4 is 0.99  normal   Hyperlipidemia  Lab Results  Component Value Date   CHOL 181 06/08/2022   CHOL 154 06/04/2021   CHOL 171 06/26/2020   Lab Results  Component Value Date   HDL 98.10 06/08/2022   HDL 85.20 06/04/2021   HDL 92.30 06/26/2020   Lab Results  Component Value  Date   LDLCALC 73 06/08/2022   LDLCALC 57 06/04/2021   LDLCALC 67 06/26/2020   Lab Results  Component Value Date   TRIG 50.0 06/08/2022   TRIG 60.0 06/04/2021   TRIG 58.0 06/26/2020   Lab Results  Component Value Date   CHOLHDL 2 06/08/2022   CHOLHDL 2 06/04/2021   CHOLHDL 2 06/26/2020   Lab Results  Component Value Date   LDLDIRECT 111.2 02/28/2013   LDLDIRECT 122.4 02/23/2012   LDLDIRECT 110.3 08/24/2011   Atorvastatin 10 mg daily   Occ she has fried chicken  Not often  May have eaten some recently   Eating leaner pork and baking instead of fried   Patient Active Problem List   Diagnosis Date Noted   Weight loss 07/10/2020   History of cholecystectomy 03/06/2019   History of pancreatitis 02/21/2019   Elevated TSH 06/28/2018   Routine general medical examination at a health care facility 04/05/2015   Estrogen deficiency 04/05/2015   Hypokalemia 09/04/2013   Nonspecific abnormal electrocardiogram (ECG) (EKG) 08/30/2013   Encounter for Medicare annual wellness exam 03/07/2013   Fullness of supraclavicular fossa 02/27/2011   Gynecological examination 02/25/2011   Cervical radiculopathy 03/31/2010   DM type 2 (diabetes mellitus, type 2) (HCC) 07/06/2007   FIBROIDS, UTERUS 03/23/2007   Hyperlipidemia associated with type 2 diabetes mellitus (HCC) 03/23/2007   Essential hypertension 03/23/2007   POSTMENOPAUSAL STATUS 03/23/2007   Past Medical History:  Diagnosis Date   Acute pancreatitis 02/22/2019   Diabetes mellitus 05/09   type II   Fibroids    Gallstones    incidental asymptomatic gallstones   Heart murmur    Hyperlipidemia    Hypertension    Past Surgical History:  Procedure Laterality Date   ANTERIOR CERVICAL DECOMPRESSION/DISCECTOMY FUSION 4 LEVELS N/A 09/21/2019   Procedure: ANTERIOR CERVICAL DECOMPRESSION FUSION CERVICAL THREE-FOUR, CERVICAL FOUR-FIVE, CERVICAL FIVE-SIX WITH INSTRUMENTATION AND ALLOGRAFT;  Surgeon: Estill Bamberg, MD;  Location: [facility]  OR;  Service: Orthopedics;  Laterality: N/A;   CHOLECYSTECTOMY N/A 02/24/2019   Procedure: LAPAROSCOPIC CHOLECYSTECTOMY;  Surgeon: Andria Meuse, MD;  Location: [facility] OR;  Service: General;  Laterality: N/A;   NO PAST SURGERIES     Social History   Tobacco Use   Smoking status: Never   Smokeless tobacco: Never  Vaping Use   Vaping Use: Never  used  Substance Use Topics   Alcohol use: No    Alcohol/week: 0.0 standard drinks of alcohol   Drug use: No   Family History  Problem Relation Age of Onset   Hypertension Mother    Diabetes Mother    Osteoporosis Sister    Diabetes Brother    Allergies  Allergen Reactions   Ibuprofen Palpitations   Sulfamethoxazole-Trimethoprim Rash   Current Outpatient Medications on File Prior to Visit  Medication Sig Dispense Refill   ACCU-CHEK AVIVA PLUS test strip USE TO CHECK BLOOD SUGAR ONCE DAILY 50 strip 0   Accu-Chek FastClix Lancets MISC TO CHECK GLUCOSE DAILY AND AS NEEDED FOR DIABETES TYPE 2 (BRAND PER PT) 102 each 0   amLODipine (NORVASC) 5 MG tablet Take 1 tablet (5 mg total) by mouth daily. 90 tablet 3   atorvastatin (LIPITOR) 10 MG tablet Take 1 tablet (10 mg total) by mouth daily. In evening 90 tablet 3   Blood Glucose Monitoring Suppl (ACCU-CHEK AVIVA PLUS) w/Device KIT Use to check glucose once daily for DM 2 (Dx. E11.9) 1 kit 0   cholecalciferol (VITAMIN D) 1000 UNITS tablet Take 2,000 Units by mouth daily.     Emollient (EUCERIN) lotion Apply 1 Bottle topically daily as needed for dry skin.     hydrochlorothiazide (HYDRODIURIL) 25 MG tablet TAKE 1 TABLET BY MOUTH EVERY DAY IN THE MORNING 90 tablet 3   lisinopril (ZESTRIL) 40 MG tablet Take 1 tablet (40 mg total) by mouth daily. 90 tablet 3   metFORMIN (GLUCOPHAGE-XR) 500 MG 24 hr tablet TAKE 1 TAB BY MOUTH EVERY DAY WITH BREAKFAST. 90 tablet 0   Multiple Vitamin (MULTIVITAMIN) tablet Take 1 tablet by mouth daily.     Polyethyl Glycol-Propyl Glycol (SYSTANE FREE OP) Place 2 drops  into both eyes daily.     potassium chloride SA (KLOR-CON M20) 20 MEQ tablet Take 1 tablet (20 mEq total) by mouth daily. 90 tablet 3   No current facility-administered medications on file prior to visit.    Review of Systems  Constitutional:  Negative for activity change, appetite change, fatigue, fever and unexpected weight change.  HENT:  Negative for congestion, ear pain, rhinorrhea, sinus pressure and sore throat.   Eyes:  Negative for pain, redness and visual disturbance.  Respiratory:  Negative for cough, shortness of breath and wheezing.   Cardiovascular:  Negative for chest pain and palpitations.  Gastrointestinal:  Negative for abdominal pain, blood in stool, constipation and diarrhea.  Endocrine: Negative for polydipsia and polyuria.  Genitourinary:  Negative for dysuria, frequency and urgency.  Musculoskeletal:  Negative for arthralgias, back pain and myalgias.  Skin:  Negative for pallor and rash.  Allergic/Immunologic: Negative for environmental allergies.  Neurological:  Negative for dizziness, syncope and headaches.  Hematological:  Negative for adenopathy. Does not bruise/bleed easily.  Psychiatric/Behavioral:  Negative for decreased concentration and dysphoric mood. The patient is not nervous/anxious.        Objective:   Physical Exam Constitutional:      General: She is not in acute distress.    Appearance: Normal appearance. She is well-developed and normal weight. She is not ill-appearing or diaphoretic.  HENT:     Head: Normocephalic and atraumatic.     Right Ear: Tympanic membrane, ear canal and external ear normal.     Left Ear: Tympanic membrane, ear canal and external ear normal.     Nose: Nose normal. No congestion.     Mouth/Throat:     Mouth:  Mucous membranes are moist.     Pharynx: Oropharynx is clear. No posterior oropharyngeal erythema.  Eyes:     General: No scleral icterus.    Extraocular Movements: Extraocular movements intact.      Conjunctiva/sclera: Conjunctivae normal.     Pupils: Pupils are equal, round, and reactive to light.  Neck:     Thyroid: No thyromegaly.     Vascular: No carotid bruit or JVD.  Cardiovascular:     Rate and Rhythm: Normal rate and regular rhythm.     Pulses: Normal pulses.     Heart sounds: Normal heart sounds.     No gallop.  Pulmonary:     Effort: Pulmonary effort is normal. No respiratory distress.     Breath sounds: Normal breath sounds. No wheezing.     Comments: Good air exch Chest:     Chest wall: No tenderness.  Abdominal:     General: Bowel sounds are normal. There is no distension or abdominal bruit.     Palpations: Abdomen is soft. There is no mass.     Tenderness: There is no abdominal tenderness.     Hernia: No hernia is present.  Genitourinary:    Comments: Breast exam: No mass, nodules, thickening, tenderness, bulging, retraction, inflamation, nipple discharge or skin changes noted.  No axillary or clavicular LA.       Small scab /healing area under R breast /looks to be post trauma or insect bite No infection signs  Musculoskeletal:        General: No tenderness. Normal range of motion.     Cervical back: Normal range of motion and neck supple. No rigidity. No muscular tenderness.     Right lower leg: No edema.     Left lower leg: No edema.     Comments: Baseline scoliosis   Lymphadenopathy:     Cervical: No cervical adenopathy.  Skin:    General: Skin is warm and dry.     Coloration: Skin is not pale.     Findings: No erythema or rash.     Comments: Few skin tags  Small scab under R breast- is healing  Neurological:     Mental Status: She is alert. Mental status is at baseline.     Cranial Nerves: No cranial nerve deficit.     Motor: No abnormal muscle tone.     Coordination: Coordination normal.     Gait: Gait normal.     Deep Tendon Reflexes: Reflexes are normal and symmetric. Reflexes normal.  Psychiatric:        Mood and Affect: Mood normal.         Cognition and Memory: Cognition and memory normal.           Assessment & Plan:   Problem List Items Addressed This Visit       Cardiovascular and Mediastinum   Essential hypertension    bp in fair control at this time  BP Readings from Last 1 Encounters:  06/15/22 (!) 126/58  No changes needed Most recent labs reviewed  Disc lifstyle change with low sodium diet and exercise  Plan to continue  Amlodipine 5 mg daily  Hctz 25 mg daily Lisinopril 40 mg daily  GFR was 54.6  Encouraged good fluid intake Will continue to monitor         Endocrine   DM type 2 (diabetes mellitus, type 2) (HCC)    Per pt doing well with less metformin xr (taking 500 mg daily)  No  hyper or hyperglycemia Lab Results  Component Value Date   HGBA1C 6.3 06/08/2022   Trying to eat more protein to mt weight  Nl microalb ratio  Takes ace and statin  Good eye care Good foot care         Hyperlipidemia associated with type 2 diabetes mellitus (HCC)    Disc goals for lipids and reasons to control them Rev last labs with pt Rev low sat fat diet in detail LDL of 73 -close to goal Ate fried chicken before lab (unusual for her) Plan to continue atorvastatin 10 mg daily        Other   Elevated TSH    No longer elevated Lab Results  Component Value Date   TSH 3.05 06/08/2022  FT4 is normal      Hypokalemia    Lab Results  Component Value Date   K 3.8 06/08/2022  Continues Klor con 20 meq daily      Routine general medical examination at a health care facility - Primary    Reviewed health habits including diet and exercise and skin cancer prevention Reviewed appropriate screening tests for age  Also reviewed health mt list, fam hx and immunization status , as well as social and family history   See HPI Labs reviewed and ordered Mammogram due in June=has it scheduled at [facility]  Cologuard neg 07/2019    Dexa utd 5/2-23   no falls or fx and takes vit D PHQ 1 Encourage to  continue exercise / gettting out and socializing

## 2022-06-15 NOTE — Assessment & Plan Note (Signed)
Reviewed health habits including diet and exercise and skin cancer prevention Reviewed appropriate screening tests for age  Also reviewed health mt list, fam hx and immunization status , as well as social and family history   See HPI Labs reviewed and ordered Mammogram due in June=has it scheduled at [facility]  Cologuard neg 07/2019    Dexa utd 5/2-23   no falls or fx and takes vit D PHQ 1 Encourage to continue exercise / gettting out and socializing

## 2022-06-15 NOTE — Assessment & Plan Note (Signed)
Disc goals for lipids and reasons to control them Rev last labs with pt Rev low sat fat diet in detail LDL of 73 -close to goal Ate fried chicken before lab (unusual for her) Plan to continue atorvastatin 10 mg daily

## 2022-06-15 NOTE — Assessment & Plan Note (Signed)
No longer elevated Lab Results  Component Value Date   TSH 3.05 06/08/2022   FT4 is normal

## 2022-08-10 DIAGNOSIS — Z1231 Encounter for screening mammogram for malignant neoplasm of breast: Secondary | ICD-10-CM | POA: Diagnosis not present

## 2022-08-10 LAB — HM MAMMOGRAPHY

## 2022-08-11 ENCOUNTER — Encounter: Payer: Self-pay | Admitting: Family Medicine

## 2022-08-13 ENCOUNTER — Ambulatory Visit (INDEPENDENT_AMBULATORY_CARE_PROVIDER_SITE_OTHER): Payer: Medicare Other

## 2022-08-13 VITALS — BP 110/60 | HR 61 | Ht 60.0 in | Wt 120.8 lb

## 2022-08-13 DIAGNOSIS — Z1211 Encounter for screening for malignant neoplasm of colon: Secondary | ICD-10-CM

## 2022-08-13 DIAGNOSIS — Z0000 Encounter for general adult medical examination without abnormal findings: Secondary | ICD-10-CM | POA: Diagnosis not present

## 2022-08-13 NOTE — Progress Notes (Addendum)
Subjective:   [name] is a 82 y.o. female who presents for Medicare Annual (Subsequent) preventive examination.  Visit Complete: In person  Review of Systems     Cardiac Risk Factors include: advanced age (>55men, >69 women);diabetes mellitus;hypertension;sedentary lifestyle     Objective:    Today's Vitals   08/13/22 1028  Weight: 120 lb 12.8 oz (54.8 kg)  Height: 5' (1.524 m)   Body mass index is 23.59 kg/m.     08/13/2022   10:39 AM 09/21/2019    3:35 PM 09/15/2019    2:37 PM 02/24/2019   10:11 AM 02/22/2019   10:02 AM 02/20/2019   10:23 PM 06/24/2018   10:17 AM  Advanced Directives  Does Patient Have a Medical Advance Directive? Yes Yes Yes No  No No  Type of Estate agent of [place];Living will Healthcare Power of [employer] Power of Attorney      Does patient want to make changes to medical advance directive?  No - Patient declined No - Patient declined      Copy of Healthcare Power of Attorney in Chart? No - copy requested No - copy requested No - copy requested      Would patient like information on creating a medical advance directive?    No - Patient declined No - Patient declined No - Guardian declined No - Patient declined    Current Medications (verified) Outpatient Encounter Medications as of 08/13/2022  Medication Sig   ACCU-CHEK AVIVA PLUS test strip USE TO CHECK BLOOD SUGAR ONCE DAILY   Accu-Chek FastClix Lancets MISC TO CHECK GLUCOSE DAILY AND AS NEEDED FOR DIABETES TYPE 2 (BRAND PER PT)   amLODipine (NORVASC) 5 MG tablet Take 1 tablet (5 mg total) by mouth daily.   atorvastatin (LIPITOR) 10 MG tablet Take 1 tablet (10 mg total) by mouth daily. In evening   Blood Glucose Monitoring Suppl (ACCU-CHEK AVIVA PLUS) w/Device KIT Use to check glucose once daily for DM 2 (Dx. E11.9)   cholecalciferol (VITAMIN D) 1000 UNITS tablet Take 2,000 Units by mouth daily.   Emollient (EUCERIN) lotion Apply 1 Bottle topically daily as needed  for dry skin.   hydrochlorothiazide (HYDRODIURIL) 25 MG tablet TAKE 1 TABLET BY MOUTH EVERY DAY IN THE MORNING   lisinopril (ZESTRIL) 40 MG tablet Take 1 tablet (40 mg total) by mouth daily.   metFORMIN (GLUCOPHAGE-XR) 500 MG 24 hr tablet TAKE 1 TAB BY MOUTH EVERY DAY WITH BREAKFAST.   Multiple Vitamin (MULTIVITAMIN) tablet Take 1 tablet by mouth daily.   Polyethyl Glycol-Propyl Glycol (SYSTANE FREE OP) Place 2 drops into both eyes daily.   potassium chloride SA (KLOR-CON M20) 20 MEQ tablet Take 1 tablet (20 mEq total) by mouth daily.   No facility-administered encounter medications on file as of 08/13/2022.    Allergies (verified) Ibuprofen and Sulfamethoxazole-trimethoprim   History: Past Medical History:  Diagnosis Date   Acute pancreatitis 02/22/2019   Diabetes mellitus 05/09   type II   Fibroids    Gallstones    incidental asymptomatic gallstones   Heart murmur    Hyperlipidemia    Hypertension    Past Surgical History:  Procedure Laterality Date   ANTERIOR CERVICAL DECOMPRESSION/DISCECTOMY FUSION 4 LEVELS N/A 09/21/2019   Procedure: ANTERIOR CERVICAL DECOMPRESSION FUSION CERVICAL THREE-FOUR, CERVICAL FOUR-FIVE, CERVICAL FIVE-SIX WITH INSTRUMENTATION AND ALLOGRAFT;  Surgeon: Estill Bamberg, MD;  Location: [facility] OR;  Service: Orthopedics;  Laterality: N/A;   CHOLECYSTECTOMY N/A 02/24/2019   Procedure: LAPAROSCOPIC CHOLECYSTECTOMY;  Surgeon: Andria Meuse, MD;  Location: [facility] OR;  Service: General;  Laterality: N/A;   NO PAST SURGERIES     Family History  Problem Relation Age of Onset   Hypertension Mother    Diabetes Mother    Osteoporosis Sister    Diabetes Brother    Social History   Socioeconomic History   Marital status: Widowed    Spouse name: Not on file   Number of children: Not on file   Years of education: Not on file   Highest education level: Not on file  Occupational History   Not on file  Tobacco Use   Smoking status: Never   Smokeless tobacco:  Never  Vaping Use   Vaping Use: Never used  Substance and Sexual Activity   Alcohol use: No    Alcohol/week: 0.0 standard drinks of alcohol   Drug use: No   Sexual activity: Not Currently  Other Topics Concern   Not on file  Social History Narrative   Not on file   Social Determinants of Health   Financial Resource Strain: Low Risk  (08/13/2022)   Overall Financial Resource Strain (CARDIA)    Difficulty of Paying Living Expenses: Not hard at all  Food Insecurity: No Food Insecurity (08/13/2022)   Hunger Vital Sign    Worried About Running Out of Food in the Last Year: Never true    Ran Out of Food in the Last Year: Never true  Transportation Needs: No Transportation Needs (08/13/2022)   PRAPARE - Administrator, Civil Service (Medical): No    Lack of Transportation (Non-Medical): No  Physical Activity: Insufficiently Active (08/13/2022)   Exercise Vital Sign    Days of Exercise per Week: 2 days    Minutes of Exercise per Session: 30 min  Stress: No Stress Concern Present (08/13/2022)   [employer] of Occupational Health - Occupational Stress Questionnaire    Feeling of Stress : Not at all  Social Connections: Moderately Integrated (08/13/2022)   Social Connection and Isolation Panel [NHANES]    Frequency of Communication with Friends and Family: More than three times a week    Frequency of Social Gatherings with Friends and Family: More than three times a week    Attends Religious Services: More than 4 times per year    Active Member of [employer] or Organizations: Yes    Attends Banker Meetings: More than 4 times per year    Marital Status: Widowed    Tobacco Counseling Counseling given: Yes   Clinical Intake:  Pre-visit preparation completed: Yes  Pain : No/denies pain     BMI - recorded: 23.59 Nutritional Risks: None Diabetes: Yes CBG done?: Yes (103 per pt) CBG resulted in Enter/ Edit results?: No Did pt. bring in CBG monitor from  home?: No  How often do you need to have someone help you when you read instructions, pamphlets, or other written materials from your doctor or pharmacy?: 1 - Never  Interpreter Needed?: No  Information entered by :: C.Emeka Lindner LPN   Activities of Daily Living    08/13/2022   10:39 AM  In your present state of health, do you have any difficulty performing the following activities:  Hearing? 0  Vision? 0  Difficulty concentrating or making decisions? 0  Walking or climbing stairs? 0  Dressing or bathing? 0  Doing errands, shopping? 0  Preparing Food and eating ? N  Using the Toilet? N  In the past six months,  have you accidently leaked urine? N  Do you have problems with loss of bowel control? N  Managing your Medications? N  Managing your Finances? N  Housekeeping or managing your Housekeeping? N    Patient Care Team: [place], Audrie Gallus, MD as PCP - [facility], Isabella Bowens, [facility] (Inactive) as Pharmacist (Pharmacist)  Indicate any recent Medical Services you may have received from other than [facility] providers in the past year (date may be approximate).     Assessment:   This is a routine wellness examination for [name].  Hearing/Vision screen Hearing Screening - Comments:: No hearing difficulties Vision Screening - Comments:: Glasses - [facility] - UTD on exams  Dietary issues and exercise activities discussed:     Goals Addressed             This Visit's Progress    Increase physical activity   On track    When schedule permits, I will resume exercise for 30 minutes 2 days per week.        Depression Screen    08/13/2022   10:38 AM 06/15/2022    9:32 AM 06/11/2021    9:04 AM 07/10/2020   10:34 AM 07/02/2020    2:22 PM 04/07/2019    2:45 PM 06/24/2018   10:18 AM  PHQ 2/9 Scores  PHQ - 2 Score 0 0 0 0 0 1 0  PHQ- 9 Score 0 1   1 2  0    Fall Risk    08/13/2022   10:39 AM 06/15/2022    9:32 AM 06/11/2021    9:03 AM 07/10/2020   10:34 AM 11/03/2019    9:04  AM  Fall Risk   Falls in the past year? 0 0 0 0 0  Number falls in past yr: 0 0   0  Injury with Fall? 0 0   0  Risk for fall due to : No Fall Risks No Fall Risks     Follow up Falls prevention discussed;Falls evaluation completed Falls evaluation completed Falls evaluation completed Falls evaluation completed     MEDICARE RISK AT HOME:  Medicare Risk at Home - 08/13/22 1040     Any stairs in or around the home? No    If so, are there any without handrails? No    Home free of loose throw rugs in walkways, pet beds, electrical cords, etc? Yes    Adequate lighting in your home to reduce risk of falls? Yes    Life alert? No    Use of a cane, walker or w/c? No    Grab bars in the bathroom? No    Shower chair or bench in shower? Yes    Elevated toilet seat or a handicapped toilet? No             TIMED UP AND GO:  Was the test performed?  Yes  Length of time to ambulate 10 feet: 10 sec Gait steady and fast without use of assistive device    Cognitive Function:    06/21/2017   10:51 AM 06/18/2016    8:14 AM  MMSE - Mini Mental State Exam  Orientation to time 5 5  Orientation to Place 5 5  Registration 3 3  Attention/ Calculation 0 0  Recall 3 3  Language- name 2 objects 0 0  Language- repeat 1 1  Language- follow 3 step command 3 3  Language- read & follow direction 0 0  Write a sentence 0 0  Copy  design 0 0  Total score 20 20        08/13/2022   10:34 AM  6CIT Screen  What Year? 0 points  What month? 0 points  What time? 0 points  Count back from 20 0 points  Months in reverse 0 points  Repeat phrase 0 points  Total Score 0 points    Immunizations Immunization History  Administered Date(s) Administered   Fluad Quad(high Dose 65+) 11/14/2018, 11/03/2019, 12/08/2021   Influenza Split 11/29/2010   Influenza Whole 11/04/2011   Influenza, High Dose Seasonal PF 11/19/2016, 12/06/2017, 11/28/2020   Influenza,inj,quad, With Preservative 10/18/2015    Influenza-Unspecified 11/12/2012, 11/16/2013, 11/21/2013, 11/15/2014, 11/01/2015   PFIZER(Purple Top)SARS-COV-2 Vaccination 03/25/2019, 04/19/2019, 12/29/2019   Pfizer Covid-19 Vaccine Bivalent Booster 58yrs & up 07/17/2021   Pneumococcal Conjugate-13 03/21/2014   Pneumococcal Polysaccharide-23 08/14/2008   Td 06/06/2003, 07/11/2013   Tdap 07/11/2013   Zoster, Live 05/23/2014    TDAP status: Up to date  Flu Vaccine status: Up to date  Pneumococcal vaccine status: Up to date  Covid-19 vaccine status: Information provided on how to obtain vaccines.   Qualifies for Shingles Vaccine? Yes   Zostavax completed Yes   Shingrix Completed?: No.    Education has been provided regarding the importance of this vaccine. Patient has been advised to call insurance company to determine out of pocket expense if they have not yet received this vaccine. Advised may also receive vaccine at local pharmacy or Health Dept. Verbalized acceptance and understanding.  Screening Tests Health Maintenance  Topic Date Due   COVID-19 Vaccine (5 - 2023-24 season) 10/17/2021   FOOT EXAM  06/12/2022   INFLUENZA VACCINE  09/17/2022   HEMOGLOBIN A1C  12/08/2022   OPHTHALMOLOGY EXAM  03/05/2023   Diabetic kidney evaluation - eGFR measurement  06/08/2023   Diabetic kidney evaluation - Urine ACR  06/08/2023   DTaP/Tdap/Td (4 - Td or Tdap) 07/12/2023   MAMMOGRAM  08/10/2023   Medicare Annual Wellness (AWV)  08/13/2023   Pneumonia Vaccine 11+ Years old  Completed   DEXA SCAN  Completed   HPV VACCINES  Aged Out   PAP SMEAR-Modifier  Discontinued   Zoster Vaccines- Shingrix  Discontinued    Health Maintenance  Health Maintenance Due  Topic Date Due   COVID-19 Vaccine (5 - 2023-24 season) 10/17/2021   FOOT EXAM  06/12/2022    Colorectal cancer screening: Type of screening: Cologuard. Completed 07/28/19. Repeat every 3 years  Mammogram status: Completed 08/10/22. Repeat every year  Bone Density status:  Completed 06/24/21. Results reflect: Bone density results: OSTEOPOROSIS. Repeat every 2 years.  Lung Cancer Screening: (Low Dose CT Chest recommended if Age 44-80 years, 20 pack-year currently smoking OR have quit w/in 15years.) does not qualify.   Lung Cancer Screening Referral: no  Additional Screening:  Hepatitis C Screening: does not qualify; Completed no  Vision Screening: Recommended annual ophthalmology exams for early detection of glaucoma and other disorders of the eye. Is the patient up to date with their annual eye exam?  Yes  Who is the provider or what is the name of the office in which the patient attends annual eye exams? [facility] If pt is not established with a provider, would they like to be referred to a provider to establish care? Yes .   Dental Screening: Recommended annual dental exams for proper oral hygiene  Diabetic Foot Exam: Diabetic Foot Exam: Completed 06/11/21  Community Resource Referral / Chronic Care Management: CRR required this visit?  No   CCM required this visit?  No     Plan:     I have personally reviewed and noted the following in the patient's chart:   Medical and social history Use of alcohol, tobacco or illicit drugs  Current medications and supplements including opioid prescriptions. Patient is not currently taking opioid prescriptions. Functional ability and status Nutritional status Physical activity Advanced directives List of other physicians Hospitalizations, surgeries, and ER visits in previous 12 months Vitals Screenings to include cognitive, depression, and falls Referrals and appointments  In addition, I have reviewed and discussed with patient certain preventive protocols, quality metrics, and best practice recommendations. A written personalized care plan for preventive services as well as general preventive health recommendations were provided to patient.     Maryan Puls, LPN   [id number]/0960   After Visit  Summary: Available in MyChart  Nurse Notes: order for cologuard placed.

## 2022-08-13 NOTE — Patient Instructions (Addendum)
[name] , Thank you for taking time to come for your Medicare Wellness Visit. I appreciate your ongoing commitment to your health goals. Please review the following plan we discussed and let me know if I can assist you in the future.   These are the goals we discussed:  Goals      Increase physical activity     When schedule permits, I will resume exercise for 30 minutes 2 days per week.      Pharmacy Care Plan     CARE PLAN ENTRY (see longitudinal plan of care for additional care plan information)  Current Barriers:  Chronic Disease Management support, education, and care coordination needs related to Hypertension and Diabetes   Hypertension BP Readings from Last 3 Encounters:  02/23/20 136/72  11/03/19 128/74  09/22/19 (!) 123/54  Pharmacist Clinical Goal(s): Over the next 6 months, patient will work with PharmD and providers to maintain BP goal <130/80 mmHg Current regimen:  Hydrochlorothiazide 25 mg - 1 tablet daily Klor Con 20 mEq - 1 tablet daily Lisinopril 40 mg - 1 tablet daily Amlodipine 5 mg - 1 tablet daily  Interventions: Comprehensive medication review Reviewed home blood pressure monitoring - Patient checks occasionally with arm cuff and BP was 120/69 at last check Patient self care activities - Over the next 6 months, patient will: Continue current medications as prescribed Incorporate a healthy diet high in vegetables, fruits and whole grains with low-fat dairy products, chicken, fish, legumes, non-tropical vegetable oils and nuts. Limit intake of sweets, sugar-sweetened beverages and red meats.  Diabetes Lab Results  Component Value Date/Time   HGBA1C 6.2 (H) 09/15/2019 02:57 PM   HGBA1C 7.0 (H) 06/27/2019 08:07 AM  Pharmacist Clinical Goal(s): Over the next 6 months, patient will work with PharmD and providers to achieve A1c goal <7% Current regimen:  Metformin 500 mg ER - 2 tablets daily with breakfast Interventions: Comprehensive medication  review Reviewed home BG readings - patient checks daily and readings are between 80-100. Denies hypoglycemia or symptoms of hypoglycemia. Patient self care activities - Over the next 6 months, patient will: Check blood sugar once daily, document, and provide at future appointments Contact provider with any episodes of hypoglycemia  Please see past updates related to this goal by clicking on the "Past Updates" button in the selected goal         This is a list of the screening recommended for you and due dates:  Health Maintenance  Topic Date Due   COVID-19 Vaccine (5 - 2023-24 season) 10/17/2021   Complete foot exam   06/12/2022   Flu Shot  09/17/2022   Hemoglobin A1C  12/08/2022   Eye exam for diabetics  03/05/2023   Yearly kidney function blood test for diabetes  06/08/2023   Yearly kidney health urinalysis for diabetes  06/08/2023   DTaP/Tdap/Td vaccine (4 - Td or Tdap) 07/12/2023   Mammogram  08/10/2023   Medicare Annual Wellness Visit  08/13/2023   Pneumonia Vaccine  Completed   DEXA scan (bone density measurement)  Completed   HPV Vaccine  Aged Out   Pap Smear  Discontinued   Zoster (Shingles) Vaccine  Discontinued    Advanced directives: none  Conditions/risks identified: Aim for 30 minutes of exercise or brisk walking, 6-8 glasses of water, and 5 servings of fruits and vegetables each day.   Next appointment: Follow up in one year for your annual wellness visit 08/17/23 @ 9:15 telephone call    Preventive Care 65  Years and Older, Female Preventive care refers to lifestyle choices and visits with your health care provider that can promote health and wellness. What does preventive care include? A yearly physical exam. This is also called an annual well check. Dental exams once or twice a year. Routine eye exams. Ask your health care provider how often you should have your eyes checked. Personal lifestyle choices, including: Daily care of your teeth and  gums. Regular physical activity. Eating a healthy diet. Avoiding tobacco and drug use. Limiting alcohol use. Practicing safe sex. Taking low-dose aspirin every day. Taking vitamin and mineral supplements as recommended by your health care provider. What happens during an annual well check? The services and screenings done by your health care provider during your annual well check will depend on your age, overall health, lifestyle risk factors, and family history of disease. Counseling  Your health care provider may ask you questions about your: Alcohol use. Tobacco use. Drug use. Emotional well-being. Home and relationship well-being. Sexual activity. Eating habits. History of falls. Memory and ability to understand (cognition). Work and work Astronomer. Reproductive health. Screening  You may have the following tests or measurements: Height, weight, and BMI. Blood pressure. Lipid and cholesterol levels. These may be checked every 5 years, or more frequently if you are over 20 years old. Skin check. Lung cancer screening. You may have this screening every year starting at age 79 if you have a 30-pack-year history of smoking and currently smoke or have quit within the past 15 years. Fecal occult blood test (FOBT) of the stool. You may have this test every year starting at age 47. Flexible sigmoidoscopy or colonoscopy. You may have a sigmoidoscopy every 5 years or a colonoscopy every 10 years starting at age 42. Hepatitis C blood test. Hepatitis B blood test. Sexually transmitted disease (STD) testing. Diabetes screening. This is done by checking your blood sugar (glucose) after you have not eaten for a while (fasting). You may have this done every 1-3 years. Bone density scan. This is done to screen for osteoporosis. You may have this done starting at age 32. Mammogram. This may be done every 1-2 years. Talk to your health care provider about how often you should have regular  mammograms. Talk with your health care provider about your test results, treatment options, and if necessary, the need for more tests. Vaccines  Your health care provider may recommend certain vaccines, such as: Influenza vaccine. This is recommended every year. Tetanus, diphtheria, and acellular pertussis (Tdap, Td) vaccine. You may need a Td booster every 10 years. Zoster vaccine. You may need this after age 34. Pneumococcal 13-valent conjugate (PCV13) vaccine. One dose is recommended after age 92. Pneumococcal polysaccharide (PPSV23) vaccine. One dose is recommended after age 80. Talk to your health care provider about which screenings and vaccines you need and how often you need them. This information is not intended to replace advice given to you by your health care provider. Make sure you discuss any questions you have with your health care provider. Document Released: 03/01/2015 Document Revised: 10/23/2015 Document Reviewed: 12/04/2014 Elsevier Interactive Patient Education  2017 [employer].  Fall Prevention in the Home Falls can cause injuries. They can happen to people of all ages. There are many things you can do to make your home safe and to help prevent falls. What can I do on the outside of my home? Regularly fix the edges of walkways and driveways and fix any cracks. Remove anything that might  make you trip as you walk through a door, such as a raised step or threshold. Trim any bushes or trees on the path to your home. Use bright outdoor lighting. Clear any walking paths of anything that might make someone trip, such as rocks or tools. Regularly check to see if handrails are loose or broken. Make sure that both sides of any steps have handrails. Any raised decks and porches should have guardrails on the edges. Have any leaves, snow, or ice cleared regularly. Use sand or salt on walking paths during winter. Clean up any spills in your garage right away. This includes oil  or grease spills. What can I do in the bathroom? Use night lights. Install grab bars by the toilet and in the tub and shower. Do not use towel bars as grab bars. Use non-skid mats or decals in the tub or shower. If you need to sit down in the shower, use a plastic, non-slip stool. Keep the floor dry. Clean up any water that spills on the floor as soon as it happens. Remove soap buildup in the tub or shower regularly. Attach bath mats securely with double-sided non-slip rug tape. Do not have throw rugs and other things on the floor that can make you trip. What can I do in the bedroom? Use night lights. Make sure that you have a light by your bed that is easy to reach. Do not use any sheets or blankets that are too big for your bed. They should not hang down onto the floor. Have a firm chair that has side arms. You can use this for support while you get dressed. Do not have throw rugs and other things on the floor that can make you trip. What can I do in the kitchen? Clean up any spills right away. Avoid walking on wet floors. Keep items that you use a lot in easy-to-reach places. If you need to reach something above you, use a strong step stool that has a grab bar. Keep electrical cords out of the way. Do not use floor polish or wax that makes floors slippery. If you must use wax, use non-skid floor wax. Do not have throw rugs and other things on the floor that can make you trip. What can I do with my stairs? Do not leave any items on the stairs. Make sure that there are handrails on both sides of the stairs and use them. Fix handrails that are broken or loose. Make sure that handrails are as long as the stairways. Check any carpeting to make sure that it is firmly attached to the stairs. Fix any carpet that is loose or worn. Avoid having throw rugs at the top or bottom of the stairs. If you do have throw rugs, attach them to the floor with carpet tape. Make sure that you have a light  switch at the top of the stairs and the bottom of the stairs. If you do not have them, ask someone to add them for you. What else can I do to help prevent falls? Wear shoes that: Do not have high heels. Have rubber bottoms. Are comfortable and fit you well. Are closed at the toe. Do not wear sandals. If you use a stepladder: Make sure that it is fully opened. Do not climb a closed stepladder. Make sure that both sides of the stepladder are locked into place. Ask someone to hold it for you, if possible. Clearly mark and make sure that you can see: Any  grab bars or handrails. First and last steps. Where the edge of each step is. Use tools that help you move around (mobility aids) if they are needed. These include: Canes. Walkers. Scooters. Crutches. Turn on the lights when you go into a dark area. Replace any light bulbs as soon as they burn out. Set up your furniture so you have a clear path. Avoid moving your furniture around. If any of your floors are uneven, fix them. If there are any pets around you, be aware of where they are. Review your medicines with your doctor. Some medicines can make you feel dizzy. This can increase your chance of falling. Ask your doctor what other things that you can do to help prevent falls. This information is not intended to replace advice given to you by your health care provider. Make sure you discuss any questions you have with your health care provider. Document Released: 11/29/2008 Document Revised: 07/11/2015 Document Reviewed: 03/09/2014 Elsevier Interactive Patient Education  2017 [employer].

## 2022-08-15 ENCOUNTER — Other Ambulatory Visit: Payer: Self-pay | Admitting: Family Medicine

## 2022-08-28 ENCOUNTER — Other Ambulatory Visit: Payer: Self-pay | Admitting: Family Medicine

## 2022-08-29 DIAGNOSIS — Z1211 Encounter for screening for malignant neoplasm of colon: Secondary | ICD-10-CM | POA: Diagnosis not present

## 2022-09-02 ENCOUNTER — Other Ambulatory Visit: Payer: Self-pay | Admitting: Family Medicine

## 2022-09-04 LAB — COLOGUARD: COLOGUARD: NEGATIVE

## 2022-10-17 ENCOUNTER — Ambulatory Visit
Admission: EM | Admit: 2022-10-17 | Discharge: 2022-10-17 | Disposition: A | Discharge status: Home / Self Care | Payer: Medicare Other | Attending: Family Medicine | Admitting: Family Medicine

## 2022-10-17 ENCOUNTER — Other Ambulatory Visit: Payer: Self-pay

## 2022-10-17 ENCOUNTER — Encounter: Payer: Self-pay | Admitting: *Deleted

## 2022-10-17 DIAGNOSIS — J209 Acute bronchitis, unspecified: Secondary | ICD-10-CM

## 2022-10-17 MED ORDER — AZITHROMYCIN 250 MG PO TABS: ORAL_TABLET | ORAL | 0 refills | Status: DC

## 2022-10-17 MED ORDER — BENZONATATE 100 MG PO CAPS: 100.0000 mg | ORAL_CAPSULE | Freq: Three times a day (TID) | ORAL | 0 refills | Status: AC

## 2022-10-17 NOTE — Discharge Instructions (Signed)
Take medication as prescribed. Return if symptoms have not improved within 7 days.

## 2022-10-17 NOTE — ED Triage Notes (Signed)
Pt reports having a cough since Monday with no improvement.

## 2022-10-17 NOTE — ED Provider Notes (Signed)
[name]    CSN: [id number] Arrival date & time: 10/17/22  2952      History   Chief Complaint Chief Complaint  Patient presents with   Cough    HPI [name] is a 82 y.o. female.  Patient worsening cough x 6 days.  Cough has been productive of mucus which she describes as mostly clear.  She denies any shortness of breath.  Patient has no history of chronic respiratory or lung disease.  Patient is a diabetic and has hypertension and has been taking over-the-counter Coricidin with minimal improvement of cough.  Patient has not had fever or bodyaches.  Patient denies any known sick contacts.  Past Medical History:  Diagnosis Date   Acute pancreatitis 02/22/2019   Diabetes mellitus 05/09   type II   Fibroids    Gallstones    incidental asymptomatic gallstones   Heart murmur    Hyperlipidemia    Hypertension     Patient Active Problem List   Diagnosis Date Noted   Weight loss 07/10/2020   History of cholecystectomy 03/06/2019   History of pancreatitis 02/21/2019   Elevated TSH 06/28/2018   Routine general medical examination at a health care facility 04/05/2015   Estrogen deficiency 04/05/2015   Hypokalemia 09/04/2013   Nonspecific abnormal electrocardiogram (ECG) (EKG) 08/30/2013   Encounter for Medicare annual wellness exam 03/07/2013   Fullness of supraclavicular fossa 02/27/2011   Gynecological examination 02/25/2011   Cervical radiculopathy 03/31/2010   DM type 2 (diabetes mellitus, type 2) (HCC) 07/06/2007   FIBROIDS, UTERUS 03/23/2007   Hyperlipidemia associated with type 2 diabetes mellitus (HCC) 03/23/2007   Essential hypertension 03/23/2007   POSTMENOPAUSAL STATUS 03/23/2007    Past Surgical History:  Procedure Laterality Date   ANTERIOR CERVICAL DECOMPRESSION/DISCECTOMY FUSION 4 LEVELS N/A 09/21/2019   Procedure: ANTERIOR CERVICAL DECOMPRESSION FUSION CERVICAL THREE-FOUR, CERVICAL FOUR-FIVE, CERVICAL FIVE-SIX WITH INSTRUMENTATION AND  ALLOGRAFT;  Surgeon: Estill Bamberg, MD;  Location: [facility] OR;  Service: Orthopedics;  Laterality: N/A;   CHOLECYSTECTOMY N/A 02/24/2019   Procedure: LAPAROSCOPIC CHOLECYSTECTOMY;  Surgeon: Andria Meuse, MD;  Location: [facility] OR;  Service: General;  Laterality: N/A;   NO PAST SURGERIES      OB History   No obstetric history on file.      Home Medications    Prior to Admission medications   Medication Sig Start Date End Date Taking? Authorizing Provider  Accu-Chek FastClix Lancets MISC TO CHECK GLUCOSE DAILY AND AS NEEDED FOR DIABETES TYPE 2 (BRAND PER PT) 05/29/22  Yes Tower, Marne A, MD  amLODipine (NORVASC) 5 MG tablet TAKE 1 TABLET (5 MG TOTAL) BY MOUTH DAILY. 08/28/22  Yes Tower, Audrie Gallus, MD  atorvastatin (LIPITOR) 10 MG tablet TAKE 1 TABLET (10 MG TOTAL) BY MOUTH DAILY. IN EVENING 09/02/22  Yes Tower, Audrie Gallus, MD  azithromycin (ZITHROMAX) 250 MG tablet Take 2 tabs PO x 1 dose, then 1 tab PO QD x 4 days 10/17/22  Yes Bing Neighbors, NP  benzonatate (TESSALON) 100 MG capsule Take 1 capsule (100 mg total) by mouth 3 (three) times daily for 10 days. 10/17/22 10/27/22 Yes Bing Neighbors, NP  Blood Glucose Monitoring Suppl (ACCU-CHEK AVIVA PLUS) w/Device KIT Use to check glucose once daily for DM 2 (Dx. E11.9) 01/23/19  Yes Tower, Audrie Gallus, MD  cholecalciferol (VITAMIN D) 1000 UNITS tablet Take 2,000 Units by mouth daily.   Yes [provider]  Emollient (EUCERIN) lotion Apply 1 Bottle topically daily as needed for  dry skin.   Yes [provider]  glucose blood (ACCU-CHEK AVIVA PLUS) test strip USE TO CHECK BLOOD SUGAR ONCE DAILY 08/17/22  Yes Tower, Marne A, MD  hydrochlorothiazide (HYDRODIURIL) 25 MG tablet TAKE 1 TABLET BY MOUTH EVERY DAY IN THE MORNING 08/28/22  Yes Tower, Marne A, MD  lisinopril (ZESTRIL) 40 MG tablet TAKE 1 TABLET BY MOUTH EVERY DAY 09/02/22  Yes Tower, Marne A, MD  metFORMIN (GLUCOPHAGE-XR) 500 MG 24 hr tablet TAKE 1 TAB BY MOUTH EVERY DAY WITH BREAKFAST.  08/28/22  Yes Tower, Audrie Gallus, MD  potassium chloride SA (KLOR-CON M20) 20 MEQ tablet TAKE 1 TABLET BY MOUTH EVERY DAY 08/28/22  Yes Tower, Audrie Gallus, MD  Multiple Vitamin (MULTIVITAMIN) tablet Take 1 tablet by mouth daily.    [provider]  Polyethyl Glycol-Propyl Glycol (SYSTANE FREE OP) Place 2 drops into both eyes daily.    [provider]    Family History Family History  Problem Relation Age of Onset   Hypertension Mother    Diabetes Mother    Osteoporosis Sister    Diabetes Brother     Social History Social History   Tobacco Use   Smoking status: Never   Smokeless tobacco: Never  Vaping Use   Vaping status: Never Used  Substance Use Topics   Alcohol use: No    Alcohol/week: 0.0 standard drinks of alcohol   Drug use: No     Allergies   Ibuprofen and Sulfamethoxazole-trimethoprim   Review of Systems Review of Systems  Respiratory:  Positive for cough.      Physical Exam Triage Vital Signs ED Triage Vitals  Encounter Vitals Group     BP 10/17/22 0957 130/68     Systolic BP Percentile --      Diastolic BP Percentile --      Pulse Rate 10/17/22 0957 78     Resp 10/17/22 0957 16     Temp 10/17/22 0957 98.7 F (37.1 C)     Temp Source 10/17/22 0957 Oral     SpO2 10/17/22 0957 95 %     Weight --      Height --      Head Circumference --      Peak Flow --      Pain Score 10/17/22 1001 0     Pain Loc --      Pain Education --      Exclude from Growth Chart --    No data found.  Updated Vital Signs BP 130/68 (BP Location: Left Arm)   Pulse 78   Temp 98.7 F (37.1 C) (Oral)   Resp 16   SpO2 95%   Visual Acuity Right Eye Distance:   Left Eye Distance:   Bilateral Distance:    Right Eye Near:   Left Eye Near:    Bilateral Near:     Physical Exam Vitals reviewed.  Constitutional:      Appearance: Normal appearance. She is ill-appearing.  HENT:     Head: Normocephalic.     Right Ear: External ear normal.     Left Ear:  External ear normal.     Nose: Nose normal. No congestion or rhinorrhea.  Eyes:     Extraocular Movements: Extraocular movements intact.     Conjunctiva/sclera: Conjunctivae normal.     Pupils: Pupils are equal, round, and reactive to light.  Cardiovascular:     Rate and Rhythm: Normal rate and regular rhythm.  Pulmonary:     Effort:  Pulmonary effort is normal.     Breath sounds: Normal breath sounds. No rhonchi or rales.  Skin:    General: Skin is warm and dry.  Neurological:     General: No focal deficit present.     Mental Status: She is alert.      UC Treatments / Results  Labs (all labs ordered are listed, but only abnormal results are displayed) Labs Reviewed - No data to display  EKG   Radiology No results found.  Procedures Procedures (including critical care time)  Medications Ordered in UC Medications - No data to display  Initial Impression / Assessment and Plan / UC Course  I have reviewed the triage vital signs and the nursing notes.  Pertinent labs & imaging results that were available during my care of the patient were reviewed by me and considered in my medical decision making (see chart for details).    Acute Bronchitis, will cover with azithromycin and opt against prednisone given patient is a diabetic not currently having any active symptoms of wheezing or restricted breathing.  For cough, benzonatate Perles 3 times daily as needed for cough.  Return precautions given if any red flag symptoms develop or if symptoms do not improve after 1 week of treatment.  Patient verbalized understanding and agreement with plan. Final Clinical Impressions(s) / UC Diagnoses   Final diagnoses:  Acute bronchitis, unspecified organism     Discharge Instructions      Take medication as prescribed. Return if symptoms have not improved within 7 days.      ED Prescriptions     Medication Sig Dispense Auth. Provider   azithromycin (ZITHROMAX) 250 MG tablet  Take 2 tabs PO x 1 dose, then 1 tab PO QD x 4 days 6 tablet Bing Neighbors, NP   benzonatate (TESSALON) 100 MG capsule Take 1 capsule (100 mg total) by mouth 3 (three) times daily for 10 days. 30 capsule Bing Neighbors, NP      PDMP not reviewed this encounter.   Bing Neighbors, NP 10/17/22 1039

## 2022-12-23 ENCOUNTER — Other Ambulatory Visit: Payer: Self-pay | Admitting: Family Medicine

## 2023-02-16 ENCOUNTER — Other Ambulatory Visit: Payer: Self-pay | Admitting: Family Medicine

## 2023-04-05 DIAGNOSIS — H43813 Vitreous degeneration, bilateral: Secondary | ICD-10-CM | POA: Diagnosis not present

## 2023-04-05 DIAGNOSIS — H35373 Puckering of macula, bilateral: Secondary | ICD-10-CM | POA: Diagnosis not present

## 2023-04-05 DIAGNOSIS — E119 Type 2 diabetes mellitus without complications: Secondary | ICD-10-CM | POA: Diagnosis not present

## 2023-04-05 DIAGNOSIS — H26491 Other secondary cataract, right eye: Secondary | ICD-10-CM | POA: Diagnosis not present

## 2023-04-05 LAB — HM DIABETES EYE EXAM

## 2023-04-06 ENCOUNTER — Other Ambulatory Visit: Payer: Self-pay | Admitting: Family Medicine

## 2023-05-15 ENCOUNTER — Other Ambulatory Visit: Payer: Self-pay | Admitting: Family Medicine

## 2023-06-06 ENCOUNTER — Telehealth: Payer: Self-pay | Admitting: Family Medicine

## 2023-06-06 DIAGNOSIS — I10 Essential (primary) hypertension: Secondary | ICD-10-CM

## 2023-06-06 DIAGNOSIS — E785 Hyperlipidemia, unspecified: Secondary | ICD-10-CM

## 2023-06-06 DIAGNOSIS — E1169 Type 2 diabetes mellitus with other specified complication: Secondary | ICD-10-CM

## 2023-06-06 DIAGNOSIS — E119 Type 2 diabetes mellitus without complications: Secondary | ICD-10-CM

## 2023-06-06 NOTE — Telephone Encounter (Signed)
-----   Message from Gerry Krone sent at 05/24/2023  2:56 PM EDT ----- Regarding: Lab orders for Wed, 4.23.25 Patient is scheduled for CPX labs, please order future labs, Thanks , Anselmo Kings

## 2023-06-09 ENCOUNTER — Other Ambulatory Visit (INDEPENDENT_AMBULATORY_CARE_PROVIDER_SITE_OTHER): Payer: Medicare Other

## 2023-06-09 DIAGNOSIS — E785 Hyperlipidemia, unspecified: Secondary | ICD-10-CM | POA: Diagnosis not present

## 2023-06-09 DIAGNOSIS — E1169 Type 2 diabetes mellitus with other specified complication: Secondary | ICD-10-CM

## 2023-06-09 DIAGNOSIS — I10 Essential (primary) hypertension: Secondary | ICD-10-CM | POA: Diagnosis not present

## 2023-06-09 DIAGNOSIS — E119 Type 2 diabetes mellitus without complications: Secondary | ICD-10-CM | POA: Diagnosis not present

## 2023-06-09 LAB — COMPREHENSIVE METABOLIC PANEL WITH GFR
ALT: 18 U/L (ref 0–35)
AST: 27 U/L (ref 0–37)
Albumin: 4.5 g/dL (ref 3.5–5.2)
Alkaline Phosphatase: 68 U/L (ref 39–117)
BUN: 12 mg/dL (ref 6–23)
CO2: 32 meq/L (ref 19–32)
Calcium: 10.5 mg/dL (ref 8.4–10.5)
Chloride: 97 meq/L (ref 96–112)
Creatinine, Ser: 0.89 mg/dL (ref 0.40–1.20)
GFR: 60.92 mL/min (ref >60.00)
Glucose, Bld: 103 mg/dL — ABNORMAL HIGH (ref 70–99)
Potassium: 3.4 meq/L — ABNORMAL LOW (ref 3.5–5.1)
Sodium: 137 meq/L (ref 135–145)
Total Bilirubin: 0.7 mg/dL (ref 0.2–1.2)
Total Protein: 7.7 g/dL (ref 6.0–8.3)

## 2023-06-09 LAB — CBC WITH DIFFERENTIAL/PLATELET
Basophils Absolute: 0.1 10*3/uL (ref 0.0–0.1)
Basophils Relative: 0.8 % (ref 0.0–3.0)
Eosinophils Absolute: 0.2 10*3/uL (ref 0.0–0.7)
Eosinophils Relative: 2.7 % (ref 0.0–5.0)
HCT: 38.4 % (ref 36.0–46.0)
Hemoglobin: 12.9 g/dL (ref 12.0–15.0)
Lymphocytes Relative: 29.9 % (ref 12.0–46.0)
Lymphs Abs: 2.1 10*3/uL (ref 0.7–4.0)
MCHC: 33.6 g/dL (ref 30.0–36.0)
MCV: 84.4 fl (ref 78.0–100.0)
Monocytes Absolute: 0.6 10*3/uL (ref 0.1–1.0)
Monocytes Relative: 8.7 % (ref 3.0–12.0)
Neutro Abs: 4.1 10*3/uL (ref 1.4–7.7)
Neutrophils Relative %: 57.9 % (ref 43.0–77.0)
Platelets: 281 10*3/uL (ref 150.0–400.0)
RBC: 4.55 Mil/uL (ref 3.87–5.11)
RDW: 14 % (ref 11.5–15.5)
WBC: 7.1 10*3/uL (ref 4.0–10.5)

## 2023-06-09 LAB — MICROALBUMIN / CREATININE URINE RATIO
Creatinine,U: 45.9 mg/dL
Microalb Creat Ratio: 20.4 mg/g (ref 0.0–30.0)
Microalb, Ur: 0.9 mg/dL (ref 0.0–1.9)

## 2023-06-09 LAB — LIPID PANEL
Cholesterol: 201 mg/dL — ABNORMAL HIGH (ref 0–200)
HDL: 108.9 mg/dL (ref >39.00)
LDL Cholesterol: 81 mg/dL (ref 0–99)
NonHDL: 92.24
Total CHOL/HDL Ratio: 2
Triglycerides: 55 mg/dL (ref 0.0–149.0)
VLDL: 11 mg/dL (ref 0.0–40.0)

## 2023-06-09 LAB — TSH: TSH: 6.04 u[IU]/mL — ABNORMAL HIGH (ref 0.35–5.50)

## 2023-06-09 LAB — HEMOGLOBIN A1C: Hgb A1c MFr Bld: 6.2 % (ref 4.6–6.5)

## 2023-06-11 ENCOUNTER — Ambulatory Visit: Payer: Medicare Other

## 2023-06-11 VITALS — Ht 59.5 in | Wt 120.0 lb

## 2023-06-11 DIAGNOSIS — Z0000 Encounter for general adult medical examination without abnormal findings: Secondary | ICD-10-CM | POA: Diagnosis not present

## 2023-06-11 NOTE — Patient Instructions (Signed)
 [name] , Thank you for taking time to come for your Medicare Wellness Visit. I appreciate your ongoing commitment to your health goals. Please review the following plan we discussed and let me know if I can assist you in the future.   Referrals/Orders/Follow-Ups/Clinician Recommendations: none  This is a list of the screening recommended for you and due dates:  Health Maintenance  Topic Date Due   Pap Smear  02/25/2012   Complete foot exam   06/12/2022   COVID-19 Vaccine (5 - 2024-25 season) 10/18/2022   Eye exam for diabetics  03/05/2023   DTaP/Tdap/Td vaccine (4 - Td or Tdap) 07/12/2023   Mammogram  08/10/2023   Flu Shot  09/17/2023   Hemoglobin A1C  12/09/2023   Yearly kidney function blood test for diabetes  06/08/2024   Yearly kidney health urinalysis for diabetes  06/08/2024   Medicare Annual Wellness Visit  06/10/2024   Pneumonia Vaccine  Completed   DEXA scan (bone density measurement)  Completed   HPV Vaccine  Aged Out   Meningitis B Vaccine  Aged Out   Zoster (Shingles) Vaccine  Discontinued    Advanced directives: (Copy Requested) Please bring a copy of your health care power of attorney and living will to the office to be added to your chart at your convenience. You can mail to [facility] [address] [place], [place] [address] or email to [email or web address]  Next Medicare Annual Wellness Visit scheduled for next year: Yes 06/13/2024 @ 10:50am televisit

## 2023-06-11 NOTE — Progress Notes (Signed)
 Subjective:   [name] is a 83 y.o. who presents for a Medicare Wellness preventive visit.  Visit Complete: Virtual I connected with  Willadean Hark on 06/11/23 by a audio enabled telemedicine application and verified that I am speaking with the correct person using two identifiers.  Patient Location: Home  Provider Location: Office/Clinic  I discussed the limitations of evaluation and management by telemedicine. The patient expressed understanding and agreed to proceed.  Vital Signs: Because this visit was a virtual/telehealth visit, some criteria may be missing or patient reported. Any vitals not documented were not able to be obtained and vitals that have been documented are patient reported.  VideoDeclined- This patient declined Librarian, academic. Therefore the visit was completed with audio only.  Persons Participating in Visit: Patient.  AWV Questionnaire: No: Patient Medicare AWV questionnaire was not completed prior to this visit.  Cardiac Risk Factors include: advanced age (>38men, >71 women);dyslipidemia;hypertension;diabetes mellitus     Objective:    Today's Vitals   06/11/23 1003  Weight: 120 lb (54.4 kg)  Height: 4' 11.5" (1.511 m)   Body mass index is 23.83 kg/m.     06/11/2023   10:17 AM 08/13/2022   10:39 AM 09/21/2019    3:35 PM 09/15/2019    2:37 PM 02/24/2019   10:11 AM 02/22/2019   10:02 AM 02/20/2019   10:23 PM  Advanced Directives  Does Patient Have a Medical Advance Directive? Yes Yes Yes Yes No  No  Type of Estate agent of [place];Living will Healthcare Power of [place];Living will Healthcare Power of [employer] Power of Attorney     Does patient want to make changes to medical advance directive?   No - Patient declined No - Patient declined     Copy of Healthcare Power of Attorney in Chart? Yes - validated most recent copy scanned in chart (See row information) No - copy requested No -  copy requested No - copy requested     Would patient like information on creating a medical advance directive?     No - Patient declined No - Patient declined No - Guardian declined    Current Medications (verified) Outpatient Encounter Medications as of 06/11/2023  Medication Sig   ACCU-CHEK AVIVA PLUS test strip USE TO CHECK BLOOD SUGAR ONCE DAILY   Accu-Chek FastClix Lancets MISC TO CHECK GLUCOSE DAILY AND AS NEEDED FOR DIABETES TYPE 2 (BRAND PER PT)   amLODipine  (NORVASC ) 5 MG tablet TAKE 1 TABLET (5 MG TOTAL) BY MOUTH DAILY.   atorvastatin  (LIPITOR) 10 MG tablet TAKE 1 TABLET (10 MG TOTAL) BY MOUTH DAILY. IN EVENING   Blood Glucose Monitoring Suppl (ACCU-CHEK AVIVA PLUS) w/Device KIT Use to check glucose once daily for DM 2 (Dx. E11.9)   cholecalciferol  (VITAMIN D ) 1000 UNITS tablet Take 2,000 Units by mouth daily.   Emollient (EUCERIN) lotion Apply 1 Bottle topically daily as needed for dry skin.   hydrochlorothiazide  (HYDRODIURIL ) 25 MG tablet TAKE 1 TABLET BY MOUTH EVERY DAY IN THE MORNING   lisinopril  (ZESTRIL ) 40 MG tablet TAKE 1 TABLET BY MOUTH EVERY DAY   metFORMIN  (GLUCOPHAGE -XR) 500 MG 24 hr tablet TAKE 1 TAB BY MOUTH EVERY DAY WITH BREAKFAST.   Multiple Vitamin (MULTIVITAMIN) tablet Take 1 tablet by mouth daily.   Polyethyl Glycol-Propyl Glycol (SYSTANE FREE OP) Place 2 drops into both eyes daily.   potassium chloride  SA (KLOR-CON  M20) 20 MEQ tablet TAKE 1 TABLET BY MOUTH EVERY DAY   azithromycin  (  ZITHROMAX ) 250 MG tablet Take 2 tabs PO x 1 dose, then 1 tab PO QD x 4 days (Patient not taking: Reported on 06/11/2023)   No facility-administered encounter medications on file as of 06/11/2023.    Allergies (verified) Ibuprofen and Sulfamethoxazole-trimethoprim   History: Past Medical History:  Diagnosis Date   Acute pancreatitis 02/22/2019   Diabetes mellitus 05/09   type II   Fibroids    Gallstones    incidental asymptomatic gallstones   Heart murmur    Hyperlipidemia     Hypertension    Past Surgical History:  Procedure Laterality Date   ANTERIOR CERVICAL DECOMPRESSION/DISCECTOMY FUSION 4 LEVELS N/A 09/21/2019   Procedure: ANTERIOR CERVICAL DECOMPRESSION FUSION CERVICAL THREE-FOUR, CERVICAL FOUR-FIVE, CERVICAL FIVE-SIX WITH INSTRUMENTATION AND ALLOGRAFT;  Surgeon: Virl Grimes, MD;  Location: [facility] OR;  Service: Orthopedics;  Laterality: N/A;   CHOLECYSTECTOMY N/A 02/24/2019   Procedure: LAPAROSCOPIC CHOLECYSTECTOMY;  Surgeon: Melvenia Stabs, MD;  Location: [facility] OR;  Service: General;  Laterality: N/A;   NO PAST SURGERIES     Family History  Problem Relation Age of Onset   Hypertension Mother    Diabetes Mother    Osteoporosis Sister    Diabetes Brother    Social History   Socioeconomic History   Marital status: Widowed    Spouse name: Not on file   Number of children: Not on file   Years of education: Not on file   Highest education level: Not on file  Occupational History   Not on file  Tobacco Use   Smoking status: Never   Smokeless tobacco: Never  Vaping Use   Vaping status: Never Used  Substance and Sexual Activity   Alcohol use: No    Alcohol/week: 0.0 standard drinks of alcohol   Drug use: No   Sexual activity: Not Currently  Other Topics Concern   Not on file  Social History Narrative   Not on file   Social Drivers of Health   Financial Resource Strain: Low Risk  (06/11/2023)   Overall Financial Resource Strain (CARDIA)    Difficulty of Paying Living Expenses: Not hard at all  Food Insecurity: No Food Insecurity (06/11/2023)   Hunger Vital Sign    Worried About Running Out of Food in the Last Year: Never true    Ran Out of Food in the Last Year: Never true  Transportation Needs: No Transportation Needs (06/11/2023)   PRAPARE - Administrator, Civil Service (Medical): No    Lack of Transportation (Non-Medical): No  Physical Activity: Insufficiently Active (06/11/2023)   Exercise Vital Sign    Days of Exercise  per Week: 3 days    Minutes of Exercise per Session: 30 min  Stress: No Stress Concern Present (06/11/2023)   [employer] of Occupational Health - Occupational Stress Questionnaire    Feeling of Stress : Not at all  Social Connections: Moderately Integrated (06/11/2023)   Social Connection and Isolation Panel [NHANES]    Frequency of Communication with Friends and Family: More than three times a week    Frequency of Social Gatherings with Friends and Family: More than three times a week    Attends Religious Services: More than 4 times per year    Active Member of [employer] or Organizations: Yes    Attends Banker Meetings: More than 4 times per year    Marital Status: Widowed    Tobacco Counseling Counseling given: Not Answered    Clinical Intake:  Pre-visit preparation completed:  Yes  Pain : No/denies pain     BMI - recorded: 23.83 Nutritional Status: BMI of 19-24  Normal Nutritional Risks: None Diabetes: Yes CBG done?: No (pt says her BS 97 on yesterday morning) Did pt. bring in CBG monitor from home?: No  Lab Results  Component Value Date   HGBA1C 6.2 06/09/2023   HGBA1C 6.3 06/08/2022   HGBA1C 6.3 09/16/2021     How often do you need to have someone help you when you read instructions, pamphlets, or other written materials from your doctor or pharmacy?: 1 - Never  Interpreter Needed?: No  Comments: lives alone Information entered by :: B.Zoeie Ritter,LPN   Activities of Daily Living     06/11/2023   10:18 AM 08/13/2022   10:39 AM  In your present state of health, do you have any difficulty performing the following activities:  Hearing? 0 0  Vision? 0 0  Difficulty concentrating or making decisions? 0 0  Walking or climbing stairs? 0 0  Dressing or bathing? 0 0  Doing errands, shopping? 0 0  Preparing Food and eating ? N N  Using the Toilet? N N  In the past six months, have you accidently leaked urine? Y N  Do you have problems with loss of  bowel control? N N  Managing your Medications? N N  Managing your Finances? N N  Housekeeping or managing your Housekeeping? N N    Patient Care Team: [place], Manley Seeds, MD as PCP - General Adams, Carmelia China, [facility] (Inactive) as Pharmacist (Pharmacist) Clair Crews, MD as Referring Physician (Ophthalmology)  Indicate any recent Medical Services you may have received from other than [facility] providers in the past year (date may be approximate).     Assessment:   This is a routine wellness examination for Ellianne.  Hearing/Vision screen Hearing Screening - Comments:: Pt says her hearing is good Vision Screening - Comments:: Pt says her vision is good Eye appt in Feb 2025 w/Dr Porfilio   Goals Addressed             This Visit's Progress    Increase physical activity   On track    06/11/23-When schedule permits, I will resume exercise for 30 minutes 2 days per week.      Patient Stated       06/11/23-I would like to get off the metformin        Depression Screen     06/11/2023   10:13 AM 08/13/2022   10:38 AM 06/15/2022    9:32 AM 06/11/2021    9:04 AM 07/10/2020   10:34 AM 07/02/2020    2:22 PM 04/07/2019    2:45 PM  PHQ 2/9 Scores  PHQ - 2 Score 0 0 0 0 0 0 1  PHQ- 9 Score  0 1   1 2     Fall Risk     06/11/2023   10:07 AM 08/13/2022   10:39 AM 06/15/2022    9:32 AM 06/11/2021    9:03 AM 07/10/2020   10:34 AM  Fall Risk   Falls in the past year? 0 0 0 0 0  Number falls in past yr: 0 0 0    Injury with Fall? 0 0 0    Risk for fall due to : No Fall Risks No Fall Risks No Fall Risks    Follow up Education provided;Falls prevention discussed Falls prevention discussed;Falls evaluation completed Falls evaluation completed Falls evaluation completed Falls evaluation completed    MEDICARE RISK  AT HOME:  Medicare Risk at Home Any stairs in or around the home?: No If so, are there any without handrails?: No Home free of loose throw rugs in walkways, pet beds, electrical  cords, etc?: Yes Adequate lighting in your home to reduce risk of falls?: Yes Life alert?: No Use of a cane, walker or w/c?: No Grab bars in the bathroom?: No Shower chair or bench in shower?: No Elevated toilet seat or a handicapped toilet?: Yes  TIMED UP AND GO:  Was the test performed?  No  Cognitive Function: 6CIT completed    06/21/2017   10:51 AM 06/18/2016    8:14 AM  MMSE - Mini Mental State Exam  Orientation to time 5 5  Orientation to Place 5 5  Registration 3 3  Attention/ Calculation 0 0  Recall 3 3  Language- name 2 objects 0 0  Language- repeat 1 1  Language- follow 3 step command 3 3  Language- read & follow direction 0 0  Write a sentence 0 0  Copy design 0 0  Total score 20 20        06/11/2023   10:19 AM 08/13/2022   10:34 AM  6CIT Screen  What Year? 0 points 0 points  What month? 0 points 0 points  What time? 0 points 0 points  Count back from 20 0 points 0 points  Months in reverse 0 points 0 points  Repeat phrase 0 points 0 points  Total Score 0 points 0 points    Immunizations Immunization History  Administered Date(s) Administered   Fluad Quad(high Dose 65+) 11/14/2018, 11/03/2019, 12/08/2021   Fluad Trivalent(High Dose 65+) 11/25/2022   Influenza Split 11/29/2010   Influenza Whole 11/04/2011   Influenza, High Dose Seasonal PF 11/19/2016, 12/06/2017, 11/28/2020   Influenza,inj,quad, With Preservative 10/18/2015   Influenza-Unspecified 11/12/2012, 11/16/2013, 11/21/2013, 11/15/2014, 11/01/2015   PFIZER(Purple Top)SARS-COV-2 Vaccination 03/25/2019, 04/19/2019, 12/29/2019   Pfizer Covid-19 Vaccine Bivalent Booster 84yrs & up 07/17/2021   Pneumococcal Conjugate-13 03/21/2014   Pneumococcal Polysaccharide-23 08/14/2008   Td 06/06/2003, 07/11/2013   Tdap 07/11/2013   Zoster, Live 05/23/2014    Screening Tests Health Maintenance  Topic Date Due   Cervical Cancer Screening (Pap smear)  02/25/2012   FOOT EXAM  06/12/2022   COVID-19 Vaccine  (5 - 2024-25 season) 10/18/2022   OPHTHALMOLOGY EXAM  03/05/2023   DTaP/Tdap/Td (4 - Td or Tdap) 07/12/2023   MAMMOGRAM  08/10/2023   INFLUENZA VACCINE  09/17/2023   HEMOGLOBIN A1C  12/09/2023   Diabetic kidney evaluation - eGFR measurement  06/08/2024   Diabetic kidney evaluation - Urine ACR  06/08/2024   Medicare Annual Wellness (AWV)  06/10/2024   Pneumonia Vaccine 69+ Years old  Completed   DEXA SCAN  Completed   HPV VACCINES  Aged Out   Meningococcal B Vaccine  Aged Out   Zoster Vaccines- Shingrix  Discontinued    Health Maintenance  Health Maintenance Due  Topic Date Due   Cervical Cancer Screening (Pap smear)  02/25/2012   FOOT EXAM  06/12/2022   COVID-19 Vaccine (5 - 2024-25 season) 10/18/2022   OPHTHALMOLOGY EXAM  03/05/2023   Health Maintenance Items Addressed: None needed at this time. Pt says last Eye Exam w/ Dr Merrell Abate was [phone]  Additional Screening:  Vision Screening: Recommended annual ophthalmology exams for early detection of glaucoma and other disorders of the eye.  Dental Screening: Recommended annual dental exams for proper oral hygiene  Community Resource Referral / Chronic Care Management:  CRR required this visit?  No   CCM required this visit?  No    Plan:     I have personally reviewed and noted the following in the patient's chart:   Medical and social history Use of alcohol, tobacco or illicit drugs  Current medications and supplements including opioid prescriptions. Patient is not currently taking opioid prescriptions. Functional ability and status Nutritional status Physical activity Advanced directives List of other physicians Hospitalizations, surgeries, and ER visits in previous 12 months Vitals Screenings to include cognitive, depression, and falls Referrals and appointments  In addition, I have reviewed and discussed with patient certain preventive protocols, quality metrics, and best practice recommendations. A written  personalized care plan for preventive services as well as general preventive health recommendations were provided to patient.   Nerissa Bannister, LPN   [id number]   After Visit Summary: (MyChart) Due to this being a telephonic visit, the after visit summary with patients personalized plan was offered to patient via MyChart   Notes: Nothing significant to report at this time.

## 2023-06-16 ENCOUNTER — Encounter: Payer: Self-pay | Admitting: Family Medicine

## 2023-06-16 ENCOUNTER — Ambulatory Visit (INDEPENDENT_AMBULATORY_CARE_PROVIDER_SITE_OTHER): Payer: Medicare Other | Admitting: Family Medicine

## 2023-06-16 VITALS — BP 118/64 | HR 79 | Temp 97.9°F | Ht 59.53 in | Wt 116.4 lb

## 2023-06-16 DIAGNOSIS — E2839 Other primary ovarian failure: Secondary | ICD-10-CM

## 2023-06-16 DIAGNOSIS — Z0000 Encounter for general adult medical examination without abnormal findings: Secondary | ICD-10-CM

## 2023-06-16 DIAGNOSIS — Z7984 Long term (current) use of oral hypoglycemic drugs: Secondary | ICD-10-CM

## 2023-06-16 DIAGNOSIS — R7989 Other specified abnormal findings of blood chemistry: Secondary | ICD-10-CM | POA: Diagnosis not present

## 2023-06-16 DIAGNOSIS — E876 Hypokalemia: Secondary | ICD-10-CM | POA: Diagnosis not present

## 2023-06-16 DIAGNOSIS — I10 Essential (primary) hypertension: Secondary | ICD-10-CM | POA: Diagnosis not present

## 2023-06-16 DIAGNOSIS — E785 Hyperlipidemia, unspecified: Secondary | ICD-10-CM

## 2023-06-16 DIAGNOSIS — E1169 Type 2 diabetes mellitus with other specified complication: Secondary | ICD-10-CM | POA: Diagnosis not present

## 2023-06-16 DIAGNOSIS — E119 Type 2 diabetes mellitus without complications: Secondary | ICD-10-CM

## 2023-06-16 NOTE — Assessment & Plan Note (Signed)
 Dexa ordered

## 2023-06-16 NOTE — Progress Notes (Signed)
 Subjective:    Patient ID: [name], female    DOB: 1940/08/31, 83 y.o.   MRN: [id number]  HPI  Here for health maintenance exam and to review chronic medical problems   Wt Readings from Last 3 Encounters:  06/16/23 116 lb 6.4 oz (52.8 kg)  06/11/23 120 lb (54.4 kg)  08/13/22 120 lb 12.8 oz (54.8 kg)   23.09 kg/m  Vitals:   06/16/23 0900  BP: 118/64  Pulse: 79  Temp: 97.9 F (36.6 C)  SpO2: 98%    Immunization History  Administered Date(s) Administered   Fluad Quad(high Dose 65+) 11/14/2018, 11/03/2019, 12/08/2021   Fluad Trivalent(High Dose 65+) 11/25/2022   Influenza Split 11/29/2010   Influenza Whole 11/04/2011   Influenza, High Dose Seasonal PF 11/19/2016, 12/06/2017, 11/28/2020   Influenza,inj,quad, With Preservative 10/18/2015   Influenza-Unspecified 11/12/2012, 11/16/2013, 11/21/2013, 11/15/2014, 11/01/2015   PFIZER(Purple Top)SARS-COV-2 Vaccination 03/25/2019, 04/19/2019, 12/29/2019   Pfizer Covid-19 Vaccine Bivalent Booster 45yrs & up 07/17/2021   Pneumococcal Conjugate-13 03/21/2014   Pneumococcal Polysaccharide-23 08/14/2008   Td 06/06/2003, 07/11/2013   Tdap 07/11/2013   Zoster, Live 05/23/2014, 06/12/2014    Health Maintenance Due  Topic Date Due   FOOT EXAM  06/12/2022   COVID-19 Vaccine (5 - 2024-25 season) 10/18/2022   OPHTHALMOLOGY EXAM  03/05/2023     Mammogram 07/2022 - goes to [facility]  Self breast exam- no lumps   Gyn health-no problems   Had a quantaflo test- normal    Colon cancer screening Cologuard neg 2021  and 08/2022   Bone health  Dexa 06/2021 osteopenia (at [facility]) [employer]  Supplements -vit D 2000 international units daily  Mvi  Last vitamin D  Lab Results  Component Value Date   VD25OH 57.40 03/28/2019    [facility]  Also walks    Mood    06/11/2023   10:13 AM 08/13/2022   10:38 AM 06/15/2022    9:32 AM 06/11/2021    9:04 AM 07/10/2020    10:34 AM  Depression screen PHQ 2/9  Decreased Interest 0 0 0 0 0  Down, Depressed, Hopeless 0 0 0 0 0  PHQ - 2 Score 0 0 0 0 0  Altered sleeping  0 1    Tired, decreased energy  0 0    Change in appetite  0 0    Feeling bad or failure about yourself   0 0    Trouble concentrating  0 0    Moving slowly or fidgety/restless  0 0    Suicidal thoughts  0 0    PHQ-9 Score  0 1    Difficult doing work/chores  Not difficult at all Not difficult at all     HTN bp is stable today  No cp or palpitations or headaches or edema  No side effects to medicines  BP Readings from Last 3 Encounters:  06/16/23 118/64  10/17/22 130/68  08/13/22 110/60     Amlodipine  5 mg daily  Hctz 25 mg daily Lisinopril  40 mg daily   Lab Results  Component Value Date   NA 137 06/09/2023   K 3.4 (L) 06/09/2023   CO2 32 06/09/2023   GLUCOSE 103 (H) 06/09/2023   BUN 12 06/09/2023   CREATININE 0.89 06/09/2023   CALCIUM  10.5 06/09/2023   GFR 60.92 06/09/2023   GFRNONAA 54 (L) 09/15/2019  Klor con 20 meq daily   DM2 Lab Results  Component Value Date  HGBA1C 6.2 06/09/2023   HGBA1C 6.3 06/08/2022   HGBA1C 6.3 09/16/2021   Metfomrin xr 500 mg daily  Trying to watch diet  Hard to avoid sugar   On ace and statin   Eye exam-[facility] in jan   Lab Results  Component Value Date   MICROALBUR 0.9 06/09/2023   MICROALBUR 2.2 (H) 06/08/2022   Hyperlipidemia Lab Results  Component Value Date   CHOL 201 (H) 06/09/2023   CHOL 181 06/08/2022   CHOL 154 06/04/2021   Lab Results  Component Value Date   HDL 108.90 06/09/2023   HDL 98.10 06/08/2022   HDL 85.20 06/04/2021   Lab Results  Component Value Date   LDLCALC 81 06/09/2023   LDLCALC 73 06/08/2022   LDLCALC 57 06/04/2021   Lab Results  Component Value Date   TRIG 55.0 06/09/2023   TRIG 50.0 06/08/2022   TRIG 60.0 06/04/2021   Lab Results  Component Value Date   CHOLHDL 2 06/09/2023   CHOLHDL 2 06/08/2022   CHOLHDL 2  06/04/2021   Lab Results  Component Value Date   LDLDIRECT 111.2 02/28/2013   LDLDIRECT 122.4 02/23/2012   LDLDIRECT 110.3 08/24/2011   Atorvastatin  10 mg daily  Occational forgets it  Good profile     Thyroid  Lab Results  Component Value Date   TSH 6.04 (H) 06/09/2023   Gets a little sluggish at times  Mildly elevated   Lab Results  Component Value Date   ALT 18 06/09/2023   AST 27 06/09/2023   ALKPHOS 68 06/09/2023   BILITOT 0.7 06/09/2023    Lab Results  Component Value Date   WBC 7.1 06/09/2023   HGB 12.9 06/09/2023   HCT 38.4 06/09/2023   MCV 84.4 06/09/2023   PLT 281.0 06/09/2023      Patient Active Problem List   Diagnosis Date Noted   Weight loss 07/10/2020   History of cholecystectomy 03/06/2019   History of pancreatitis 02/21/2019   Elevated TSH 06/28/2018   Routine general medical examination at a health care facility 04/05/2015   Estrogen deficiency 04/05/2015   Hypokalemia 09/04/2013   Nonspecific abnormal electrocardiogram (ECG) (EKG) 08/30/2013   Encounter for Medicare annual wellness exam 03/07/2013   Fullness of supraclavicular fossa 02/27/2011   Encounter for gynecological examination 02/25/2011   Cervical radiculopathy 03/31/2010   DM type 2 (diabetes mellitus, type 2) (HCC) 07/06/2007   FIBROIDS, UTERUS 03/23/2007   Hyperlipidemia associated with type 2 diabetes mellitus (HCC) 03/23/2007   Essential hypertension 03/23/2007   POSTMENOPAUSAL STATUS 03/23/2007   Past Medical History:  Diagnosis Date   Acute pancreatitis 02/22/2019   Diabetes mellitus 05/09   type II   Fibroids    Gallstones    incidental asymptomatic gallstones   Heart murmur    Hyperlipidemia    Hypertension    Past Surgical History:  Procedure Laterality Date   ANTERIOR CERVICAL DECOMPRESSION/DISCECTOMY FUSION 4 LEVELS N/A 09/21/2019   Procedure: ANTERIOR CERVICAL DECOMPRESSION FUSION CERVICAL THREE-FOUR, CERVICAL FOUR-FIVE, CERVICAL FIVE-SIX WITH  INSTRUMENTATION AND ALLOGRAFT;  Surgeon: Virl Grimes, MD;  Location: [facility] OR;  Service: Orthopedics;  Laterality: N/A;   CHOLECYSTECTOMY N/A 02/24/2019   Procedure: LAPAROSCOPIC CHOLECYSTECTOMY;  Surgeon: Melvenia Stabs, MD;  Location: [facility] OR;  Service: General;  Laterality: N/A;   NO PAST SURGERIES     Social History   Tobacco Use   Smoking status: Never   Smokeless tobacco: Never  Vaping Use   Vaping status: Never Used  Substance Use Topics  Alcohol use: No    Alcohol/week: 0.0 standard drinks of alcohol   Drug use: No   Family History  Problem Relation Age of Onset   Hypertension Mother    Diabetes Mother    Osteoporosis Sister    Diabetes Brother    Allergies  Allergen Reactions   Ibuprofen Palpitations   Sulfamethoxazole-Trimethoprim Rash   Current Outpatient Medications on File Prior to Visit  Medication Sig Dispense Refill   ACCU-CHEK AVIVA PLUS test strip USE TO CHECK BLOOD SUGAR ONCE DAILY 50 strip 1   Accu-Chek FastClix Lancets MISC TO CHECK GLUCOSE DAILY AND AS NEEDED FOR DIABETES TYPE 2 (BRAND PER PT) 102 each 0   amLODipine  (NORVASC ) 5 MG tablet TAKE 1 TABLET (5 MG TOTAL) BY MOUTH DAILY. 90 tablet 2   atorvastatin  (LIPITOR) 10 MG tablet TAKE 1 TABLET (10 MG TOTAL) BY MOUTH DAILY. IN EVENING 90 tablet 1   Blood Glucose Monitoring Suppl (ACCU-CHEK AVIVA PLUS) w/Device KIT Use to check glucose once daily for DM 2 (Dx. E11.9) 1 kit 0   cholecalciferol  (VITAMIN D ) 1000 UNITS tablet Take 2,000 Units by mouth daily.     Emollient (EUCERIN) lotion Apply 1 Bottle topically daily as needed for dry skin.     hydrochlorothiazide  (HYDRODIURIL ) 25 MG tablet TAKE 1 TABLET BY MOUTH EVERY DAY IN THE MORNING 90 tablet 2   lisinopril  (ZESTRIL ) 40 MG tablet TAKE 1 TABLET BY MOUTH EVERY DAY 90 tablet 1   metFORMIN  (GLUCOPHAGE -XR) 500 MG 24 hr tablet TAKE 1 TAB BY MOUTH EVERY DAY WITH BREAKFAST. 90 tablet 2   Multiple Vitamin (MULTIVITAMIN) tablet Take 1 tablet by mouth daily.      Polyethyl Glycol-Propyl Glycol (SYSTANE FREE OP) Place 2 drops into both eyes daily.     potassium chloride  SA (KLOR-CON  M20) 20 MEQ tablet TAKE 1 TABLET BY MOUTH EVERY DAY 90 tablet 2   No current facility-administered medications on file prior to visit.    Review of Systems  Constitutional:  Negative for activity change, appetite change, fatigue, fever and unexpected weight change.       Occational sluggishness   HENT:  Negative for congestion, ear pain, rhinorrhea, sinus pressure and sore throat.   Eyes:  Negative for pain, redness and visual disturbance.  Respiratory:  Negative for cough, shortness of breath and wheezing.   Cardiovascular:  Negative for chest pain and palpitations.  Gastrointestinal:  Negative for abdominal pain, blood in stool, constipation and diarrhea.  Endocrine: Negative for polydipsia and polyuria.  Genitourinary:  Negative for dysuria, frequency and urgency.  Musculoskeletal:  Negative for arthralgias, back pain and myalgias.  Skin:  Negative for pallor and rash.  Allergic/Immunologic: Negative for environmental allergies.  Neurological:  Negative for dizziness, syncope and headaches.  Hematological:  Negative for adenopathy. Does not bruise/bleed easily.  Psychiatric/Behavioral:  Negative for decreased concentration and dysphoric mood. The patient is not nervous/anxious.        Objective:   Physical Exam Constitutional:      General: She is not in acute distress.    Appearance: Normal appearance. She is well-developed and normal weight. She is not ill-appearing or diaphoretic.  HENT:     Head: Normocephalic and atraumatic.     Right Ear: Tympanic membrane, ear canal and external ear normal.     Left Ear: Tympanic membrane, ear canal and external ear normal.     Nose: Nose normal. No congestion.     Mouth/Throat:     Mouth: Mucous membranes  are moist.     Pharynx: Oropharynx is clear. No posterior oropharyngeal erythema.  Eyes:     General: No  scleral icterus.    Extraocular Movements: Extraocular movements intact.     Conjunctiva/sclera: Conjunctivae normal.     Pupils: Pupils are equal, round, and reactive to light.  Neck:     Thyroid : No thyromegaly.     Vascular: No carotid bruit or JVD.  Cardiovascular:     Rate and Rhythm: Normal rate and regular rhythm.     Pulses: Normal pulses.     Heart sounds: Normal heart sounds.     No gallop.  Pulmonary:     Effort: Pulmonary effort is normal. No respiratory distress.     Breath sounds: Normal breath sounds. No wheezing.     Comments: Good air exch Chest:     Chest wall: No tenderness.  Abdominal:     General: Bowel sounds are normal. There is no distension or abdominal bruit.     Palpations: Abdomen is soft. There is no mass.     Tenderness: There is no abdominal tenderness.     Hernia: No hernia is present.  Genitourinary:    Comments: Breast exam: No mass, nodules, thickening, tenderness, bulging, retraction, inflamation, nipple discharge or skin changes noted.  No axillary or clavicular LA.     Musculoskeletal:        General: No tenderness. Normal range of motion.     Cervical back: Normal range of motion and neck supple. No rigidity. No muscular tenderness.     Right lower leg: No edema.     Left lower leg: No edema.     Comments: No kyphosis   Lymphadenopathy:     Cervical: No cervical adenopathy.  Skin:    General: Skin is warm and dry.     Coloration: Skin is not pale.     Findings: No erythema or rash.  Neurological:     Mental Status: She is alert. Mental status is at baseline.     Cranial Nerves: No cranial nerve deficit.     Motor: No abnormal muscle tone.     Coordination: Coordination normal.     Gait: Gait normal.     Deep Tendon Reflexes: Reflexes are normal and symmetric. Reflexes normal.  Psychiatric:        Mood and Affect: Mood normal.        Cognition and Memory: Cognition and memory normal.           Assessment & Plan:   Problem  List Items Addressed This Visit       Cardiovascular and Mediastinum   Essential hypertension   bp in fair control at this time  BP Readings from Last 1 Encounters:  06/16/23 118/64   No changes needed Most recent labs reviewed  Disc lifstyle change with low sodium diet and exercise  Plan to continue  Amlodipine  5 mg daily  Hctz 25 mg daily Lisinopril  40 mg daily  GFR was 60.9 Encouraged good fluid intake Will continue to monitor         Endocrine   Hyperlipidemia associated with type 2 diabetes mellitus (HCC)   Disc goals for lipids and reasons to control them Rev last labs with pt Rev low sat fat diet in detail  LDL up to 81 but HDL over 161 now   Continues atorvastatin  10 mg daily         DM type 2 (diabetes mellitus, type 2) (HCC)  Lab Results  Component Value Date   HGBA1C 6.2 06/09/2023   HGBA1C 6.3 06/08/2022   HGBA1C 6.3 09/16/2021   Continues metformin  xr 500 mg daily  Microalb utd On ace and statin  Sent for eye exam report from January         Other   Routine general medical examination at a health care facility - Primary   Reviewed health habits including diet and exercise and skin cancer prevention Reviewed appropriate screening tests for age  Also reviewed health mt list, fam hx and immunization status , as well as social and family history   See HPI Labs reviewed and ordered Health Maintenance  Topic Date Due   Complete foot exam   06/12/2022   COVID-19 Vaccine (5 - 2024-25 season) 10/18/2022   Eye exam for diabetics  03/05/2023   DTaP/Tdap/Td vaccine (4 - Td or Tdap) 07/12/2023   Mammogram  08/10/2023   Flu Shot  09/17/2023   Hemoglobin A1C  12/09/2023   Yearly kidney function blood test for diabetes  06/08/2024   Yearly kidney health urinalysis for diabetes  06/08/2024   Medicare Annual Wellness Visit  06/10/2024   Pneumonia Vaccine  Completed   DEXA scan (bone density measurement)  Completed   HPV Vaccine  Aged Out    Meningitis B Vaccine  Aged Out   Zoster (Shingles) Vaccine  Discontinued    Dexa ordered  Discussed fall prevention, supplements and exercise for bone density  Quanfiflo screen normal from ins co  Cologuard neg 08/2022 PHq 0       Hypokalemia   Lab Results  Component Value Date   K 3.4 (L) 06/09/2023   Continues klor con 20 meq daily  Does take hydrochlorothiazide  No symptoms watching      Estrogen deficiency   Dexa ordered       Relevant Orders   DG Bone Density   Elevated TSH   This is back up  Lab Results  Component Value Date   TSH 6.04 (H) 06/09/2023    May be slightly sluggish Re check 3 mo wit hft4       Relevant Orders   TSH   T4, free

## 2023-06-16 NOTE — Assessment & Plan Note (Signed)
 Reviewed health habits including diet and exercise and skin cancer prevention Reviewed appropriate screening tests for age  Also reviewed health mt list, fam hx and immunization status , as well as social and family history   See HPI Labs reviewed and ordered Health Maintenance  Topic Date Due   Complete foot exam   06/12/2022   COVID-19 Vaccine (5 - 2024-25 season) 10/18/2022   Eye exam for diabetics  03/05/2023   DTaP/Tdap/Td vaccine (4 - Td or Tdap) 07/12/2023   Mammogram  08/10/2023   Flu Shot  09/17/2023   Hemoglobin A1C  12/09/2023   Yearly kidney function blood test for diabetes  06/08/2024   Yearly kidney health urinalysis for diabetes  06/08/2024   Medicare Annual Wellness Visit  06/10/2024   Pneumonia Vaccine  Completed   DEXA scan (bone density measurement)  Completed   HPV Vaccine  Aged Out   Meningitis B Vaccine  Aged Out   Zoster (Shingles) Vaccine  Discontinued    Dexa ordered  Discussed fall prevention, supplements and exercise for bone density  Quanfiflo screen normal from ins co  Cologuard neg 08/2022 PHq 0

## 2023-06-16 NOTE — Assessment & Plan Note (Signed)
 bp in fair control at this time  BP Readings from Last 1 Encounters:  06/16/23 118/64   No changes needed Most recent labs reviewed  Disc lifstyle change with low sodium diet and exercise  Plan to continue  Amlodipine  5 mg daily  Hctz 25 mg daily Lisinopril  40 mg daily  GFR was 60.9 Encouraged good fluid intake Will continue to monitor

## 2023-06-16 NOTE — Assessment & Plan Note (Signed)
 This is back up  Lab Results  Component Value Date   TSH 6.04 (H) 06/09/2023    May be slightly sluggish Re check 3 mo wit hft4

## 2023-06-16 NOTE — Patient Instructions (Addendum)
 Mammogram is due in June  Let us  know if you need a referral   Thyroid  lab is borderline  I want to re check thyroid  numbers in 3 months   Keep exercising    Call and schedule your bone density test  You have an order for:  []   2D Mammogram  []   3D Mammogram  [x]   Bone Density     Please call for appointment:   []   [facility]  [address] [place] [address]  [phone]  []   [facility])   [address]  [place], [place] [address]  [phone]  []   [facility]      [address] [place], [place]        [phone]         []   [facility]  [address] [place], [place]  [phone]  [x]  [facility]   [address]   [place], [place] [address]  [phone]  []  [facility]  [address] [place], [place] [address] [phone]    Make sure to wear two piece clothing  No lotions powders or deodorants the day of the appointment Make sure to bring picture ID and insurance card.  Bring list of medications you are currently taking including any supplements.   Schedule your screening mammogram through MyChart!   Select [facility] imaging sites can now be scheduled through MyChart.  Log into your MyChart account.  Go to 'Visit' (or 'Appointments' if  on mobile App) --> Schedule an  Appointment  Under 'Select a Reason for Visit' choose the Mammogram  Screening option.  Complete the pre-visit questions  and select the time and place that  best fits your schedule

## 2023-06-16 NOTE — Assessment & Plan Note (Signed)
 Lab Results  Component Value Date   HGBA1C 6.2 06/09/2023   HGBA1C 6.3 06/08/2022   HGBA1C 6.3 09/16/2021   Continues metformin  xr 500 mg daily  Microalb utd On ace and statin  Sent for eye exam report from January

## 2023-06-16 NOTE — Assessment & Plan Note (Signed)
 Lab Results  Component Value Date   K 3.4 (L) 06/09/2023   Continues klor con 20 meq daily  Does take hydrochlorothiazide  No symptoms watching

## 2023-06-16 NOTE — Assessment & Plan Note (Signed)
 Disc goals for lipids and reasons to control them Rev last labs with pt Rev low sat fat diet in detail  LDL up to 81 but HDL over 782 now   Continues atorvastatin  10 mg daily

## 2023-07-12 ENCOUNTER — Other Ambulatory Visit: Payer: Self-pay | Admitting: Family Medicine

## 2023-07-24 ENCOUNTER — Other Ambulatory Visit: Payer: Self-pay | Admitting: Family Medicine

## 2023-08-09 ENCOUNTER — Ambulatory Visit (INDEPENDENT_AMBULATORY_CARE_PROVIDER_SITE_OTHER)
Admission: RE | Admit: 2023-08-09 | Discharge: 2023-08-09 | Disposition: A | Discharge status: Home / Self Care | Source: Ambulatory Visit | Attending: Family Medicine | Admitting: Family Medicine

## 2023-08-09 DIAGNOSIS — E2839 Other primary ovarian failure: Secondary | ICD-10-CM | POA: Diagnosis not present

## 2023-08-10 ENCOUNTER — Ambulatory Visit: Payer: Self-pay | Admitting: Family Medicine

## 2023-08-11 ENCOUNTER — Other Ambulatory Visit: Payer: Self-pay | Admitting: Family Medicine

## 2023-08-26 DIAGNOSIS — Z1231 Encounter for screening mammogram for malignant neoplasm of breast: Secondary | ICD-10-CM | POA: Diagnosis not present

## 2023-08-26 LAB — HM MAMMOGRAPHY

## 2023-09-15 ENCOUNTER — Other Ambulatory Visit (INDEPENDENT_AMBULATORY_CARE_PROVIDER_SITE_OTHER)

## 2023-09-15 DIAGNOSIS — R7989 Other specified abnormal findings of blood chemistry: Secondary | ICD-10-CM

## 2023-09-15 DIAGNOSIS — R946 Abnormal results of thyroid function studies: Secondary | ICD-10-CM

## 2023-09-15 LAB — TSH: TSH: 4.72 u[IU]/mL (ref 0.35–5.50)

## 2023-09-15 LAB — T4, FREE: Free T4: 0.95 ng/dL (ref 0.60–1.60)

## 2023-10-29 ENCOUNTER — Other Ambulatory Visit: Payer: Self-pay | Admitting: Family Medicine

## 2024-02-05 ENCOUNTER — Other Ambulatory Visit: Payer: Self-pay | Admitting: Family Medicine

## 2024-02-08 ENCOUNTER — Other Ambulatory Visit: Payer: Self-pay | Admitting: Family Medicine

## 2024-06-13 ENCOUNTER — Ambulatory Visit
